# Patient Record
Sex: Female | Born: 1960
Health system: Southern US, Community
[De-identification: ages and names within clinical notes are randomized; demographics above are authoritative.]

## PROBLEM LIST (undated history)

## (undated) DIAGNOSIS — F329 Major depressive disorder, single episode, unspecified: Secondary | ICD-10-CM

## (undated) DIAGNOSIS — E785 Hyperlipidemia, unspecified: Secondary | ICD-10-CM

## (undated) DIAGNOSIS — K760 Fatty (change of) liver, not elsewhere classified: Secondary | ICD-10-CM

## (undated) DIAGNOSIS — F419 Anxiety disorder, unspecified: Secondary | ICD-10-CM

## (undated) DIAGNOSIS — M47812 Spondylosis without myelopathy or radiculopathy, cervical region: Secondary | ICD-10-CM

## (undated) DIAGNOSIS — I700 Atherosclerosis of aorta: Secondary | ICD-10-CM

## (undated) DIAGNOSIS — M5030 Other cervical disc degeneration, unspecified cervical region: Secondary | ICD-10-CM

## (undated) DIAGNOSIS — F32A Depression, unspecified: Secondary | ICD-10-CM

## (undated) DIAGNOSIS — I10 Essential (primary) hypertension: Secondary | ICD-10-CM

## (undated) DIAGNOSIS — J439 Emphysema, unspecified: Secondary | ICD-10-CM

## (undated) DIAGNOSIS — R7303 Prediabetes: Principal | ICD-10-CM

## (undated) HISTORY — DX: Emphysema, unspecified: J43.9

## (undated) HISTORY — DX: Prediabetes: R73.03

## (undated) HISTORY — DX: Atherosclerosis of aorta: I70.0

## (undated) HISTORY — DX: Spondylosis without myelopathy or radiculopathy, cervical region: M47.812

## (undated) HISTORY — DX: Other cervical disc degeneration, unspecified cervical region: M50.30

## (undated) HISTORY — DX: Fatty (change of) liver, not elsewhere classified: K76.0

---

## 2003-12-11 ENCOUNTER — Other Ambulatory Visit: Payer: Self-pay

## 2004-09-16 ENCOUNTER — Emergency Department: Payer: Self-pay | Admitting: Emergency Medicine

## 2005-05-02 ENCOUNTER — Ambulatory Visit: Payer: Self-pay

## 2005-06-18 ENCOUNTER — Emergency Department: Payer: Self-pay | Admitting: Emergency Medicine

## 2005-06-18 ENCOUNTER — Other Ambulatory Visit: Payer: Self-pay

## 2006-02-13 ENCOUNTER — Emergency Department: Payer: Self-pay | Admitting: Unknown Physician Specialty

## 2008-05-06 ENCOUNTER — Ambulatory Visit: Payer: Self-pay | Admitting: Family Medicine

## 2008-05-31 ENCOUNTER — Ambulatory Visit: Payer: Self-pay | Admitting: Internal Medicine

## 2008-06-05 ENCOUNTER — Emergency Department: Payer: Self-pay | Admitting: Emergency Medicine

## 2008-06-09 ENCOUNTER — Ambulatory Visit: Payer: Self-pay | Admitting: Internal Medicine

## 2008-06-30 ENCOUNTER — Ambulatory Visit: Payer: Self-pay | Admitting: Internal Medicine

## 2008-07-12 ENCOUNTER — Ambulatory Visit: Payer: Self-pay

## 2008-07-28 ENCOUNTER — Ambulatory Visit: Payer: Self-pay

## 2008-07-31 ENCOUNTER — Ambulatory Visit: Payer: Self-pay | Admitting: Internal Medicine

## 2008-08-05 ENCOUNTER — Ambulatory Visit: Payer: Self-pay | Admitting: Internal Medicine

## 2008-08-12 ENCOUNTER — Ambulatory Visit: Payer: Self-pay | Admitting: Internal Medicine

## 2008-08-30 ENCOUNTER — Ambulatory Visit: Payer: Self-pay | Admitting: Internal Medicine

## 2008-12-09 ENCOUNTER — Ambulatory Visit: Payer: Self-pay | Admitting: Internal Medicine

## 2008-12-29 ENCOUNTER — Ambulatory Visit: Payer: Self-pay | Admitting: Internal Medicine

## 2009-05-12 ENCOUNTER — Ambulatory Visit: Payer: Self-pay | Admitting: Family Medicine

## 2010-12-09 ENCOUNTER — Emergency Department: Payer: Self-pay | Admitting: Emergency Medicine

## 2011-06-07 ENCOUNTER — Emergency Department: Payer: Self-pay | Admitting: Emergency Medicine

## 2011-07-28 ENCOUNTER — Emergency Department: Payer: Self-pay | Admitting: Unknown Physician Specialty

## 2011-11-25 ENCOUNTER — Emergency Department: Payer: Self-pay | Admitting: Emergency Medicine

## 2011-11-25 LAB — URINALYSIS, COMPLETE
Bacteria: NONE SEEN
Bilirubin,UR: NEGATIVE
Ketone: NEGATIVE
Leukocyte Esterase: NEGATIVE
Protein: NEGATIVE
RBC,UR: 1 /HPF (ref 0–5)
Specific Gravity: 1.014 (ref 1.003–1.030)
WBC UR: <1 /HPF (ref 0–5)

## 2011-11-25 LAB — CBC
HCT: 41.5 % (ref 35.0–47.0)
HGB: 14.1 g/dL (ref 12.0–16.0)
MCH: 30.6 pg (ref 26.0–34.0)
MCHC: 34.1 g/dL (ref 32.0–36.0)
MCV: 90 fL (ref 80–100)
RBC: 4.62 10*6/uL (ref 3.80–5.20)
WBC: 11.9 10*3/uL — ABNORMAL HIGH (ref 3.6–11.0)

## 2011-11-25 LAB — COMPREHENSIVE METABOLIC PANEL
Albumin: 3.7 g/dL (ref 3.4–5.0)
Anion Gap: 7 (ref 7–16)
Bilirubin,Total: 0.5 mg/dL (ref 0.2–1.0)
Calcium, Total: 8.7 mg/dL (ref 8.5–10.1)
Chloride: 103 mmol/L (ref 98–107)
Co2: 28 mmol/L (ref 21–32)
Creatinine: 0.77 mg/dL (ref 0.60–1.30)
EGFR (African American): >60
EGFR (Non-African Amer.): >60
Glucose: 123 mg/dL — ABNORMAL HIGH (ref 65–99)
Osmolality: 276 (ref 275–301)
Sodium: 138 mmol/L (ref 136–145)

## 2012-05-08 ENCOUNTER — Ambulatory Visit: Payer: Self-pay | Admitting: Family Medicine

## 2013-06-18 ENCOUNTER — Ambulatory Visit: Payer: Self-pay | Admitting: Family Medicine

## 2013-06-28 ENCOUNTER — Ambulatory Visit: Payer: Self-pay | Admitting: Family Medicine

## 2014-08-15 ENCOUNTER — Ambulatory Visit: Payer: Self-pay | Admitting: Family Medicine

## 2015-07-13 ENCOUNTER — Encounter: Payer: Self-pay | Admitting: Emergency Medicine

## 2015-07-13 ENCOUNTER — Emergency Department
Admission: EM | Admit: 2015-07-13 | Discharge: 2015-07-13 | Disposition: A | Discharge status: Home / Self Care | Payer: No Typology Code available for payment source | Attending: Emergency Medicine | Admitting: Emergency Medicine

## 2015-07-13 DIAGNOSIS — K859 Acute pancreatitis without necrosis or infection, unspecified: Secondary | ICD-10-CM | POA: Diagnosis not present

## 2015-07-13 DIAGNOSIS — R1030 Lower abdominal pain, unspecified: Secondary | ICD-10-CM | POA: Diagnosis present

## 2015-07-13 DIAGNOSIS — Z720 Tobacco use: Secondary | ICD-10-CM | POA: Insufficient documentation

## 2015-07-13 DIAGNOSIS — I10 Essential (primary) hypertension: Secondary | ICD-10-CM | POA: Diagnosis not present

## 2015-07-13 HISTORY — DX: Essential (primary) hypertension: I10

## 2015-07-13 HISTORY — DX: Anxiety disorder, unspecified: F41.9

## 2015-07-13 HISTORY — DX: Depression, unspecified: F32.A

## 2015-07-13 HISTORY — DX: Major depressive disorder, single episode, unspecified: F32.9

## 2015-07-13 HISTORY — DX: Hyperlipidemia, unspecified: E78.5

## 2015-07-13 LAB — COMPREHENSIVE METABOLIC PANEL
ALBUMIN: 4.2 g/dL (ref 3.5–5.0)
ALK PHOS: 49 U/L (ref 38–126)
ALT: 20 U/L (ref 14–54)
ANION GAP: 9 (ref 5–15)
AST: 41 U/L (ref 15–41)
BUN: 12 mg/dL (ref 6–20)
CALCIUM: 8.9 mg/dL (ref 8.9–10.3)
CO2: 26 mmol/L (ref 22–32)
Chloride: 103 mmol/L (ref 101–111)
Creatinine, Ser: 0.73 mg/dL (ref 0.44–1.00)
GFR calc non Af Amer: >60 mL/min (ref >60)
GLUCOSE: 102 mg/dL — AB (ref 65–99)
POTASSIUM: 4.1 mmol/L (ref 3.5–5.1)
SODIUM: 138 mmol/L (ref 135–145)
Total Bilirubin: 0.7 mg/dL (ref 0.3–1.2)
Total Protein: 7.1 g/dL (ref 6.5–8.1)

## 2015-07-13 LAB — CBC
HEMATOCRIT: 42.7 % (ref 35.0–47.0)
HEMOGLOBIN: 14.2 g/dL (ref 12.0–16.0)
MCH: 30.5 pg (ref 26.0–34.0)
MCHC: 33.2 g/dL (ref 32.0–36.0)
MCV: 91.7 fL (ref 80.0–100.0)
Platelets: 362 10*3/uL (ref 150–440)
RBC: 4.66 MIL/uL (ref 3.80–5.20)
RDW: 14 % (ref 11.5–14.5)
WBC: 6.9 10*3/uL (ref 3.6–11.0)

## 2015-07-13 LAB — URINALYSIS COMPLETE WITH MICROSCOPIC (ARMC ONLY)
BACTERIA UA: NONE SEEN
Bilirubin Urine: NEGATIVE
Glucose, UA: NEGATIVE mg/dL
Hgb urine dipstick: NEGATIVE
KETONES UR: NEGATIVE mg/dL
Leukocytes, UA: NEGATIVE
NITRITE: NEGATIVE
PH: 8 (ref 5.0–8.0)
PROTEIN: NEGATIVE mg/dL
SPECIFIC GRAVITY, URINE: 1.008 (ref 1.005–1.030)
WBC UA: NONE SEEN WBC/hpf (ref 0–5)

## 2015-07-13 LAB — LIPASE, BLOOD: LIPASE: 92 U/L — AB (ref 22–51)

## 2015-07-13 MED ORDER — OXYCODONE-ACETAMINOPHEN 5-325 MG PO TABS: 1.0000 | ORAL_TABLET | Freq: Four times a day (QID) | ORAL | Status: DC | PRN

## 2015-07-13 NOTE — ED Provider Notes (Signed)
[facility] Emergency Department Provider Note  Time seen: 1:04 PM  I have reviewed the triage vital signs and the nursing notes.   HISTORY  Chief Complaint Abdominal Pain    HPI Savannah Washington is a 54 y.o. female with a past medical history of depression, anxiety, hypertension, hyperlipidemia who presents the emergency department up abdominal pain. According to the patient for the past one week she has had a dull aching upper abdominal pain.  Denies any worsening of pain with food. The patient does admit 6-7 beers nightly for the past 3 or 4 months. Has not seen her primary care physician about this, but states she plans to. Denies any nausea or vomiting. Denies diarrhea, dysuria, fever. Describes her pain as mild.    Past Medical History  Diagnosis Date  . Depression   . Anxiety   . Hypertension   . Hyperlipidemia     There are no active problems to display for this patient.   History reviewed. No pertinent past surgical history.  No current outpatient prescriptions on file.  Allergies Review of patient's allergies indicates no known allergies.  History reviewed. No pertinent family history.  Social History Social History  Substance Use Topics  . Smoking status: Current Every Day Smoker  . Smokeless tobacco: None  . Alcohol Use: Yes    Review of Systems Constitutional: Negative for fever. Cardiovascular: Negative for chest pain. Respiratory: Negative for shortness of breath. Gastrointestinal: Positive for upper abdominal pain. Negative for nausea, vomiting, diarrhea. Genitourinary: Negative for dysuria. Musculoskeletal: Negative for back pain. Neurological: Negative for headache 10-point ROS otherwise negative.  ____________________________________________   PHYSICAL EXAM:  VITAL SIGNS: ED Triage Vitals  Enc Vitals Group     BP 07/13/15 0944 155/109 mmHg     Pulse Rate 07/13/15 0944 88     Resp 07/13/15 0944 20     Temp  07/13/15 0944 98.5 F (36.9 C)     Temp Source 07/13/15 0944 Oral     SpO2 07/13/15 0944 95 %     Weight 07/13/15 0944 140 lb (63.504 kg)     Height 07/13/15 0944 5\' 2"  (1.575 m)     Head Cir --      Peak Flow --      Pain Score 07/13/15 0945 6     Pain Loc --      Pain Edu? --      Excl. in [place]? --     Constitutional: Alert and oriented. Well appearing and in no distress. Eyes: Normal exam ENT   Head: Normocephalic and atraumatic.   Mouth/Throat: Mucous membranes are moist. Cardiovascular: Normal rate, regular rhythm. No murmur Respiratory: Normal respiratory effort without tachypnea nor retractions. Breath sounds are clear and equal bilaterally. No wheezes/rales/rhonchi. Gastrointestinal: Soft, moderate upper abdominal tenderness palpation in the epigastrium. No distention. No rebound or guarding. Musculoskeletal: Nontender with normal range of motion in all extremities. Neurologic:  Normal speech and language. No gross focal neurologic deficits Psychiatric: Mood and affect are normal. Speech and behavior are normal.  ____________________________________________    INITIAL IMPRESSION / ASSESSMENT AND PLAN / ED COURSE  Pertinent labs & imaging results that were available during my care of the patient were reviewed by me and considered in my medical decision making (see chart for details).  Patient's labs are consistent with a mild pancreatitis. Likely due to alcoholism. I discussed this with the patient, she strongly prefers to go home, believes that she can stop drinking, does  not wish for inpatient detox. I discussed in depth with the patient the need to limit her diet to clear fluids for the next 48 hours, no alcohol use. Denies any history of complicated withdrawal or seizures. Patient is follow up with her primary care doctor on Monday. Patient agreeable we'll discharge with Percocet, I discussed in length the dangers of taking Percocet with alcohol, patient again assures  me that she is able to stop drinking. I discussed very strict return precautions with the patient, if she begins vomiting, and having worsening abdominal pain, she is to return to the emergency department for reevaluation and possible admission.  ____________________________________________   FINAL CLINICAL IMPRESSION(S) / ED DIAGNOSES  Pancreatitis   Harvest Dark, MD 07/13/15 1310

## 2015-07-13 NOTE — Discharge Instructions (Signed)
As we have discussed your labs today are consistent with pancreatitis. This is likely due to to alcohol intake. Please discontinue use of alcohol. Please stick with a clear liquid diet as we discussed for the first 48 hours. This may consist of things like water, Jell-O, etc. After this time you may slowly increase your diet to include starches, do not eat any greasy/oily/fried foods. you may take your pain medication as needed, as prescribed, as we discussed do not drink alcohol when taking this pain medication as the results could be deadly. Please follow up your primary care physician on Monday for reevaluation.    Acute Pancreatitis Acute pancreatitis is a disease in which the pancreas becomes suddenly irritated (inflamed). The pancreas is a large gland behind your stomach. The pancreas makes enzymes that help digest food. The pancreas also makes 2 hormones that help control your blood sugar. Acute pancreatitis happens when the enzymes attack and damage the pancreas. Most attacks last a couple of days and can cause serious problems. HOME CARE  Follow your doctor's diet instructions. You may need to avoid alcohol and limit fat in your diet.  Eat small meals often.  Drink enough fluids to keep your pee (urine) clear or pale yellow.  Only take medicines as told by your doctor.  Avoid drinking alcohol if it caused your disease.  Do not smoke.  Get plenty of rest.  Check your blood sugar at home as told by your doctor.  Keep all doctor visits as told. GET HELP IF:  You do not get better as quickly as expected.  You have new or worsening symptoms.  You have lasting pain, weakness, or feel sick to your stomach (nauseous).  You get better and then have another pain attack. GET HELP RIGHT AWAY IF:   You are unable to eat or keep fluids down.  Your pain becomes severe.  You have a fever or lasting symptoms for more than 2 to 3 days.  You have a fever and your symptoms suddenly get  worse.  Your skin or the white part of your eyes turn yellow (jaundice).  You throw up (vomit).  You feel dizzy, or you pass out (faint).  Your blood sugar is high (over 300 mg/dL). MAKE SURE YOU:   Understand these instructions.  Will watch your condition.  Will get help right away if you are not doing well or get worse.   This information is not intended to replace advice given to you by your health care provider. Make sure you discuss any questions you have with your health care provider.   Document Released: 03/04/2008 Document Revised: 10/07/2014 Document Reviewed: 12/26/2011 Elsevier Interactive Patient Education [employer].

## 2015-07-13 NOTE — ED Notes (Signed)
Pt to ed with c/o generalized abd pain.  Pt denies n/v/d.  Pt states she has been under a great amount of stress recently, states " I think I may have an ulcer"

## 2015-09-19 ENCOUNTER — Other Ambulatory Visit: Payer: Self-pay | Admitting: Family Medicine

## 2015-09-19 DIAGNOSIS — Z1231 Encounter for screening mammogram for malignant neoplasm of breast: Secondary | ICD-10-CM

## 2015-09-28 ENCOUNTER — Ambulatory Visit: Payer: No Typology Code available for payment source | Attending: Family Medicine

## 2015-10-30 ENCOUNTER — Emergency Department
Admission: EM | Admit: 2015-10-30 | Discharge: 2015-10-30 | Disposition: A | Discharge status: Home / Self Care | Payer: No Typology Code available for payment source | Attending: Emergency Medicine | Admitting: Emergency Medicine

## 2015-10-30 ENCOUNTER — Encounter: Payer: Self-pay | Admitting: Emergency Medicine

## 2015-10-30 DIAGNOSIS — F329 Major depressive disorder, single episode, unspecified: Secondary | ICD-10-CM | POA: Insufficient documentation

## 2015-10-30 DIAGNOSIS — I10 Essential (primary) hypertension: Secondary | ICD-10-CM | POA: Insufficient documentation

## 2015-10-30 DIAGNOSIS — K2970 Gastritis, unspecified, without bleeding: Secondary | ICD-10-CM | POA: Insufficient documentation

## 2015-10-30 DIAGNOSIS — F17210 Nicotine dependence, cigarettes, uncomplicated: Secondary | ICD-10-CM | POA: Insufficient documentation

## 2015-10-30 LAB — COMPREHENSIVE METABOLIC PANEL
ALBUMIN: 4.7 g/dL (ref 3.5–5.0)
ALT: 18 U/L (ref 14–54)
ANION GAP: 10 (ref 5–15)
AST: 30 U/L (ref 15–41)
Alkaline Phosphatase: 75 U/L (ref 38–126)
BILIRUBIN TOTAL: 0.6 mg/dL (ref 0.3–1.2)
BUN: 17 mg/dL (ref 6–20)
CO2: 28 mmol/L (ref 22–32)
Calcium: 9.5 mg/dL (ref 8.9–10.3)
Chloride: 101 mmol/L (ref 101–111)
Creatinine, Ser: 0.87 mg/dL (ref 0.44–1.00)
GFR calc Af Amer: >60 mL/min (ref >60)
GFR calc non Af Amer: >60 mL/min (ref >60)
GLUCOSE: 74 mg/dL (ref 65–99)
POTASSIUM: 4 mmol/L (ref 3.5–5.1)
SODIUM: 139 mmol/L (ref 135–145)
TOTAL PROTEIN: 7.8 g/dL (ref 6.5–8.1)

## 2015-10-30 LAB — CBC
HEMATOCRIT: 44.9 % (ref 35.0–47.0)
HEMOGLOBIN: 14.9 g/dL (ref 12.0–16.0)
MCH: 30.5 pg (ref 26.0–34.0)
MCHC: 33.2 g/dL (ref 32.0–36.0)
MCV: 91.8 fL (ref 80.0–100.0)
Platelets: 433 10*3/uL (ref 150–440)
RBC: 4.89 MIL/uL (ref 3.80–5.20)
RDW: 13.8 % (ref 11.5–14.5)
WBC: 12 10*3/uL — ABNORMAL HIGH (ref 3.6–11.0)

## 2015-10-30 LAB — URINALYSIS COMPLETE WITH MICROSCOPIC (ARMC ONLY)
BACTERIA UA: NONE SEEN
Bilirubin Urine: NEGATIVE
GLUCOSE, UA: NEGATIVE mg/dL
Hgb urine dipstick: NEGATIVE
Ketones, ur: NEGATIVE mg/dL
Leukocytes, UA: NEGATIVE
Nitrite: NEGATIVE
PROTEIN: NEGATIVE mg/dL
SPECIFIC GRAVITY, URINE: 1.008 (ref 1.005–1.030)
pH: 6 (ref 5.0–8.0)

## 2015-10-30 LAB — LIPASE, BLOOD: Lipase: 52 U/L — ABNORMAL HIGH (ref 11–51)

## 2015-10-30 MED ORDER — GI COCKTAIL ~~LOC~~
30.0000 mL | Freq: Once | ORAL | Status: AC
  Administered 2015-10-30: 30 mL via ORAL
  Filled 2015-10-30: qty 30

## 2015-10-30 MED ORDER — ONDANSETRON 4 MG PO TBDP
8.0000 mg | ORAL_TABLET | Freq: Once | ORAL | Status: AC
  Administered 2015-10-30: 8 mg via ORAL
  Filled 2015-10-30: qty 2

## 2015-10-30 MED ORDER — ONDANSETRON HCL 4 MG PO TABS: 4.0000 mg | ORAL_TABLET | Freq: Every day | ORAL | Status: DC | PRN

## 2015-10-30 NOTE — Discharge Instructions (Signed)

## 2015-10-30 NOTE — ED Provider Notes (Signed)
[facility] Emergency Department Provider Note  ____________________________________________   I have reviewed the triage vital signs and the nursing notes.   HISTORY  Chief Complaint Abdominal Pain    HPI Savannah Washington is a 55 y.o. female who presents with complaints of mild epigastric discomfort over the last 2 weeks. She is concerned that her pancreas may be irritated. She does report a history of heavy drinking in the past but reports that she no longer drinks heavily. She complains of nausea but no vomiting or diarrhea. No fevers or chills. Patient reports she has a family member with vomiting     Past Medical History  Diagnosis Date  . Depression   . Anxiety   . Hypertension   . Hyperlipidemia     There are no active problems to display for this patient.   History reviewed. No pertinent past surgical history.  Current Outpatient Rx  Name  Route  Sig  Dispense  Refill  . oxyCODONE-acetaminophen (ROXICET) 5-325 MG tablet   Oral   Take 1 tablet by mouth every 6 (six) hours as needed.   20 tablet   0     Allergies Review of patient's allergies indicates no known allergies.  No family history on file.  Social History Social History  Substance Use Topics  . Smoking status: Current Every Day Smoker -- 0.50 packs/day    Types: Cigarettes  . Smokeless tobacco: None  . Alcohol Use: Yes    Review of Systems  Constitutional: Negative for fever. Eyes: Negative for visual changes. ENT: Negative for sore throat Cardiovascular: Negative for chest pain. Respiratory: Negative for shortness of breath. Gastrointestinal: As above Genitourinary: Negative for dysuria. Musculoskeletal: Negative for back pain. Skin: Negative for rash. Neurological: Negative for headaches or focal weakness Psychiatric: Mild depression from husband's recent death, no SI or HI, does sound a  psychiatrist    ____________________________________________   PHYSICAL EXAM:  VITAL SIGNS: ED Triage Vitals  Enc Vitals Group     BP 10/30/15 1510 121/92 mmHg     Pulse Rate 10/30/15 1510 85     Resp 10/30/15 1510 18     Temp 10/30/15 1510 98.1 F (36.7 C)     Temp Source 10/30/15 1510 Oral     SpO2 10/30/15 1510 92 %     Weight 10/30/15 1503 145 lb (65.772 kg)     Height 10/30/15 1503 5\' 2"  (1.575 m)     Head Cir --      Peak Flow --      Pain Score 10/30/15 1505 6     Pain Loc --      Pain Edu? --      Excl. in [place]? --      Constitutional: Alert and oriented. Well appearing and in no distress. Eyes: Conjunctivae are normal.  ENT   Head: Normocephalic and atraumatic.   Mouth/Throat: Mucous membranes are moist. Cardiovascular: Normal rate, regular rhythm. Normal and symmetric distal pulses are present in all extremities. No murmurs, rubs, or gallops. Respiratory: Normal respiratory effort without tachypnea nor retractions. Breath sounds are clear and equal bilaterally.  Gastrointestinal: Soft and non-tender in all quadrants. No distention. There is no CVA tenderness. Genitourinary: deferred Musculoskeletal: Nontender with normal range of motion in all extremities. No lower extremity tenderness nor edema. Neurologic:  Normal speech and language. No gross focal neurologic deficits are appreciated. Skin:  Skin is warm, dry and intact. No rash noted. Psychiatric: Mood and affect are normal. Patient  exhibits appropriate insight and judgment.  ____________________________________________    LABS (pertinent positives/negatives)  Labs Reviewed  LIPASE, BLOOD - Abnormal; Notable for the following:    Lipase 52 (*)    All other components within normal limits  CBC - Abnormal; Notable for the following:    WBC 12.0 (*)    All other components within normal limits  URINALYSIS COMPLETEWITH MICROSCOPIC ([facility] ONLY) - Abnormal; Notable for the following:    Color,  Urine STRAW (*)    APPearance CLEAR (*)    Squamous Epithelial / LPF 0-5 (*)    All other components within normal limits  COMPREHENSIVE METABOLIC PANEL    ____________________________________________   EKG  ED ECG REPORT I, Lavonia Drafts, the attending physician, personally viewed and interpreted this ECG.  Date: 10/30/2015 EKG Time: 3:16 PM Rate: 88 Rhythm: normal sinus rhythm QRS Axis: normal Intervals: normal ST/T Wave abnormalities: normal Conduction Disturbances: none Narrative Interpretation: unremarkable   ____________________________________________    RADIOLOGY I have personally reviewed any xrays that were ordered on this patient: None  ____________________________________________   PROCEDURES  Procedure(s) performed: none  Critical Care performed: none  ____________________________________________   INITIAL IMPRESSION / ASSESSMENT AND PLAN / ED COURSE  Pertinent labs & imaging results that were available during my care of the patient were reviewed by me and considered in my medical decision making (see chart for details).  Patient presents with mild epigastric pain for the last 2 weeks. She has no tenderness to palpation. Not consistent with cholecystitis. Her lipase is essentially normal. I suspect mild gastritis.  Patient did get relief from a GI cocktail. I'll send her home on a PPI and have her follow-up with her outpatient physician. Return precautions discussed ____________________________________________   FINAL CLINICAL IMPRESSION(S) / ED DIAGNOSES  Final diagnoses:  Gastritis     Lavonia Drafts, MD 10/30/15 1714

## 2015-10-30 NOTE — ED Notes (Signed)
Epigastric pain for 2 weeks now, pt states it feels like her pancreatitis has started again. Nauseated, no vomiting or diarrhea.

## 2015-11-29 ENCOUNTER — Ambulatory Visit: Payer: No Typology Code available for payment source | Attending: Family Medicine

## 2015-12-07 ENCOUNTER — Ambulatory Visit
Admission: RE | Admit: 2015-12-07 | Discharge: 2015-12-07 | Disposition: A | Discharge status: Home / Self Care | Payer: No Typology Code available for payment source | Source: Ambulatory Visit | Attending: Family Medicine | Admitting: Family Medicine

## 2015-12-07 DIAGNOSIS — Z1231 Encounter for screening mammogram for malignant neoplasm of breast: Secondary | ICD-10-CM | POA: Insufficient documentation

## 2017-01-06 ENCOUNTER — Other Ambulatory Visit: Payer: Self-pay | Admitting: Obstetrics and Gynecology

## 2017-01-06 DIAGNOSIS — Z1231 Encounter for screening mammogram for malignant neoplasm of breast: Secondary | ICD-10-CM

## 2017-01-27 ENCOUNTER — Emergency Department: Payer: BLUE CROSS/BLUE SHIELD

## 2017-01-27 ENCOUNTER — Emergency Department
Admission: EM | Admit: 2017-01-27 | Discharge: 2017-01-27 | Disposition: A | Discharge status: Home / Self Care | Payer: BLUE CROSS/BLUE SHIELD | Attending: Emergency Medicine | Admitting: Emergency Medicine

## 2017-01-27 ENCOUNTER — Encounter: Payer: Self-pay | Admitting: Emergency Medicine

## 2017-01-27 DIAGNOSIS — Y929 Unspecified place or not applicable: Secondary | ICD-10-CM | POA: Insufficient documentation

## 2017-01-27 DIAGNOSIS — S8992XA Unspecified injury of left lower leg, initial encounter: Secondary | ICD-10-CM | POA: Diagnosis present

## 2017-01-27 DIAGNOSIS — F17210 Nicotine dependence, cigarettes, uncomplicated: Secondary | ICD-10-CM | POA: Diagnosis not present

## 2017-01-27 DIAGNOSIS — S8012XA Contusion of left lower leg, initial encounter: Secondary | ICD-10-CM

## 2017-01-27 DIAGNOSIS — Y939 Activity, unspecified: Secondary | ICD-10-CM | POA: Insufficient documentation

## 2017-01-27 DIAGNOSIS — Z79899 Other long term (current) drug therapy: Secondary | ICD-10-CM | POA: Insufficient documentation

## 2017-01-27 DIAGNOSIS — I10 Essential (primary) hypertension: Secondary | ICD-10-CM | POA: Insufficient documentation

## 2017-01-27 DIAGNOSIS — W228XXA Striking against or struck by other objects, initial encounter: Secondary | ICD-10-CM | POA: Diagnosis not present

## 2017-01-27 DIAGNOSIS — Y999 Unspecified external cause status: Secondary | ICD-10-CM | POA: Insufficient documentation

## 2017-01-27 NOTE — ED Notes (Addendum)
See triage note  States she hit her lower leg about 1 month ago while running to get in the car  conts to have swelling to lateral aspect of leg  Ambulates well to treatment room

## 2017-01-27 NOTE — ED Triage Notes (Signed)
Pt reports hitting left side of leg on car door x1 month ago, reports continued swelling.

## 2017-01-27 NOTE — ED Provider Notes (Signed)
[facility] Emergency Department Provider Note   ____________________________________________   None    (approximate)  I have reviewed the triage vital signs and the nursing notes.   HISTORY  Chief Complaint Leg Pain    HPI Savannah Washington is a 56 y.o. female patient complaining of left pain and swelling 1 month. Patient state she was contused by car door during a rainstorm.Patient state ecchymosis has resolved but the swelling is persistent and seems to have increased in the last week. Patient denies pain except when pressure is placed against area. Patient denies chest pain or shortness of breath. Patient does not take blood thinners. Past Medical History:  Diagnosis Date  . Anxiety   . Depression   . Hyperlipidemia   . Hypertension     There are no active problems to display for this patient.   No past surgical history on file.  Prior to Admission medications   Medication Sig Start Date End Date Taking? Authorizing Provider  albuterol (PROVENTIL HFA;VENTOLIN HFA) 108 (90 Base) MCG/ACT inhaler Inhale 2 puffs into the lungs every 6 (six) hours as needed for wheezing or shortness of breath.   Yes Historical Provider, MD  ALPRAZolam Duanne Moron) 0.5 MG tablet Take 0.5 mg by mouth at bedtime as needed for anxiety.   Yes Historical Provider, MD  buPROPion (WELLBUTRIN) 100 MG tablet Take 100 mg by mouth 2 (two) times daily.   Yes Historical Provider, MD  lisinopril (PRINIVIL,ZESTRIL) 5 MG tablet Take 5 mg by mouth daily.   Yes Historical Provider, MD  meloxicam (MOBIC) 7.5 MG tablet Take 7.5 mg by mouth daily.   Yes Historical Provider, MD  metFORMIN (GLUCOPHAGE) 1000 MG tablet Take 1,000 mg by mouth 2 (two) times daily with a meal.   Yes Historical Provider, MD  ondansetron (ZOFRAN) 4 MG tablet Take 1 tablet (4 mg total) by mouth daily as needed for nausea or vomiting. 10/30/15   Lavonia Drafts, MD  oxyCODONE-acetaminophen (ROXICET) 5-325 MG tablet Take 1  tablet by mouth every 6 (six) hours as needed. 07/13/15   Harvest Dark, MD    Allergies Patient has no known allergies.  Family History  Problem Relation Age of Onset  . Breast cancer Sister 38    Social History Social History  Substance Use Topics  . Smoking status: Current Every Day Smoker    Packs/day: 0.50    Types: Cigarettes  . Smokeless tobacco: Not on file  . Alcohol use Yes    Review of Systems  Constitutional: No fever/chills Eyes: No visual changes. ENT: No sore throat. Cardiovascular: Denies chest pain. Respiratory: Denies shortness of breath. Gastrointestinal: No abdominal pain.  No nausea, no vomiting.  No diarrhea.  No constipation. Genitourinary: Negative for dysuria. Musculoskeletal: Negative for back pain. Skin: Negative for rash. Neurological: Negative for headaches, focal weakness or numbness. Psychiatric:Anxiety and depression. Endocrine:Hyperlipidemia, diabetes, hypertension.  ____________________________________________   PHYSICAL EXAM:  VITAL SIGNS: ED Triage Vitals  Enc Vitals Group     BP 01/27/17 1223 (!) 129/91     Pulse Rate 01/27/17 1223 82     Resp 01/27/17 1223 17     Temp 01/27/17 1223 97.5 F (36.4 C)     Temp Source 01/27/17 1223 Oral     SpO2 01/27/17 1223 96 %     Weight --      Height --      Head Circumference --      Peak Flow --  Pain Score 01/27/17 1219 0     Pain Loc --      Pain Edu? --      Excl. in [place]? --     Constitutional: Alert and oriented. Well appearing and in no acute distress. Eyes: Conjunctivae are normal. PERRL. EOMI. Head: Atraumatic. Nose: No congestion/rhinnorhea. Mouth/Throat: Mucous membranes are moist.  Oropharynx non-erythematous. Neck: No stridor.  No cervical spine tenderness to palpation. Hematological/Lymphatic/Immunilogical: No cervical lymphadenopathy. Cardiovascular: Normal rate, regular rhythm. Grossly normal heart sounds.  Good peripheral circulation. Respiratory:  Normal respiratory effort.  No retractions. Lungs CTAB. Gastrointestinal: Soft and nontender. No distention. No abdominal bruits. No CVA tenderness. Musculoskeletal: No lower extremity tenderness nor edema.  No joint effusions. Neurologic:  Normal speech and language. No gross focal neurologic deficits are appreciated. No gait instability. Skin:  Skin is warm, dry and intact. No rash noted. Psychiatric: Mood and affect are normal. Speech and behavior are normal.  ____________________________________________   LABS (all labs ordered are listed, but only abnormal results are displayed)  Labs Reviewed - No data to display ____________________________________________  EKG   ____________________________________________  RADIOLOGY   _findings on ultrasound the left leg. PROCEDURES  Procedure(s) performed: None  Procedures  Critical Care performed: No  ____________________________________________   INITIAL IMPRESSION / ASSESSMENT AND PLAN / ED COURSE  Pertinent labs & imaging results that were available during my care of the patient were reviewed by me and considered in my medical decision making (see chart for details).  Hematoma secondary to contusion. Discussed ultrasound finding with patient. Patient given discharge care instructions. Patient advised follow-up With Dr. as needed.    ____________________________________________   FINAL CLINICAL IMPRESSION(S) / ED DIAGNOSES  Final diagnoses:  Leg hematoma, left, initial encounter      NEW MEDICATIONS STARTED DURING THIS VISIT:  New Prescriptions   No medications on file     Note:  This document was prepared using Dragon voice recognition software and may include unintentional dictation errors.    Sable Feil, PA-C 01/27/17 [place], MD 01/27/17 [phone]

## 2017-02-11 ENCOUNTER — Ambulatory Visit: Payer: Self-pay | Admitting: Family Medicine

## 2017-02-25 ENCOUNTER — Ambulatory Visit
Admission: RE | Admit: 2017-02-25 | Discharge: 2017-02-25 | Disposition: A | Discharge status: Home / Self Care | Payer: BLUE CROSS/BLUE SHIELD | Source: Ambulatory Visit | Attending: Obstetrics and Gynecology | Admitting: Obstetrics and Gynecology

## 2017-02-25 DIAGNOSIS — Z1231 Encounter for screening mammogram for malignant neoplasm of breast: Secondary | ICD-10-CM

## 2017-03-14 ENCOUNTER — Ambulatory Visit (INDEPENDENT_AMBULATORY_CARE_PROVIDER_SITE_OTHER): Payer: BLUE CROSS/BLUE SHIELD | Admitting: Family Medicine

## 2017-03-14 ENCOUNTER — Encounter: Payer: Self-pay | Admitting: Family Medicine

## 2017-03-14 DIAGNOSIS — F1290 Cannabis use, unspecified, uncomplicated: Secondary | ICD-10-CM

## 2017-03-14 DIAGNOSIS — Z5181 Encounter for therapeutic drug level monitoring: Secondary | ICD-10-CM

## 2017-03-14 DIAGNOSIS — E782 Mixed hyperlipidemia: Secondary | ICD-10-CM

## 2017-03-14 DIAGNOSIS — E785 Hyperlipidemia, unspecified: Secondary | ICD-10-CM | POA: Insufficient documentation

## 2017-03-14 DIAGNOSIS — I10 Essential (primary) hypertension: Secondary | ICD-10-CM | POA: Insufficient documentation

## 2017-03-14 DIAGNOSIS — F411 Generalized anxiety disorder: Secondary | ICD-10-CM

## 2017-03-14 DIAGNOSIS — F419 Anxiety disorder, unspecified: Secondary | ICD-10-CM | POA: Insufficient documentation

## 2017-03-14 DIAGNOSIS — R7303 Prediabetes: Secondary | ICD-10-CM | POA: Diagnosis not present

## 2017-03-14 HISTORY — DX: Prediabetes: R73.03

## 2017-03-14 LAB — COMPLETE METABOLIC PANEL WITH GFR
ALT: 22 U/L (ref 6–29)
AST: 31 U/L (ref 10–35)
Albumin: 4.6 g/dL (ref 3.6–5.1)
Alkaline Phosphatase: 57 U/L (ref 33–130)
BUN: 13 mg/dL (ref 7–25)
CALCIUM: 10.1 mg/dL (ref 8.6–10.4)
CHLORIDE: 101 mmol/L (ref 98–110)
CO2: 23 mmol/L (ref 20–31)
Creat: 0.99 mg/dL (ref 0.50–1.05)
GFR, EST AFRICAN AMERICAN: 73 mL/min (ref >=60)
GFR, EST NON AFRICAN AMERICAN: 63 mL/min (ref >=60)
Glucose, Bld: 96 mg/dL (ref 65–99)
POTASSIUM: 4.8 mmol/L (ref 3.5–5.3)
Sodium: 138 mmol/L (ref 135–146)
Total Bilirubin: 0.2 mg/dL (ref 0.2–1.2)
Total Protein: 6.8 g/dL (ref 6.1–8.1)

## 2017-03-14 LAB — CBC WITH DIFFERENTIAL/PLATELET
BASOS ABS: 75 {cells}/uL (ref 0–200)
Basophils Relative: 1 %
Eosinophils Absolute: 225 cells/uL (ref 15–500)
Eosinophils Relative: 3 %
HEMATOCRIT: 42.5 % (ref 35.0–45.0)
HEMOGLOBIN: 13.7 g/dL (ref 11.7–15.5)
LYMPHS ABS: 2775 {cells}/uL (ref 850–3900)
Lymphocytes Relative: 37 %
MCH: 28.8 pg (ref 27.0–33.0)
MCHC: 32.2 g/dL (ref 32.0–36.0)
MCV: 89.5 fL (ref 80.0–100.0)
MONO ABS: 750 {cells}/uL (ref 200–950)
MPV: 8.2 fL (ref 7.5–12.5)
Monocytes Relative: 10 %
NEUTROS ABS: 3675 {cells}/uL (ref 1500–7800)
NEUTROS PCT: 49 %
Platelets: 427 10*3/uL — ABNORMAL HIGH (ref 140–400)
RBC: 4.75 MIL/uL (ref 3.80–5.10)
RDW: 14.1 % (ref 11.0–15.0)
WBC: 7.5 10*3/uL (ref 3.8–10.8)

## 2017-03-14 LAB — LIPID PANEL
CHOLESTEROL: 212 mg/dL — AB (ref <200)
HDL: 44 mg/dL — AB (ref >50)
TRIGLYCERIDES: 459 mg/dL — AB (ref <150)
Total CHOL/HDL Ratio: 4.8 Ratio (ref <5.0)

## 2017-03-14 MED ORDER — BUPROPION HCL ER (XL) 300 MG PO TB24: 300.0000 mg | ORAL_TABLET | Freq: Every day | ORAL | 1 refills | Status: DC

## 2017-03-14 NOTE — Patient Instructions (Addendum)
Try to use PLAIN allergy medicine without the decongestant Avoid: phenylephrine, phenylpropanolamine, and pseudoephredine  Try to limit saturated fats in your diet (bologna, hot dogs, barbeque, cheeseburgers, hamburgers, steak, bacon, sausage, cheese, etc.) and get more fresh fruits, vegetables, and whole grains  I recommend strongly that you decrease your benzo Cut down on the benzo (clonazepam) by 1/4 to 1/2 pill every week and then you should be off complelely in a few weeks Increase your wellbutrin (bupropion) to the once a day 300 mg srength  12 Ways to Curb Anxiety  ?Anxiety is normal human sensation. It is what helped our ancestors survive the pitfalls of the wilderness. Anxiety is defined as experiencing worry or nervousness about an imminent event or something with an uncertain outcome. It is a feeling experienced by most people at some point in their lives. Anxiety can be triggered by a very personal issue, such as the illness of a loved one, or an event of global proportions, such as a refugee crisis. Some of the symptoms of anxiety are:  Feeling restless.  Having a feeling of impending danger.  Increased heart rate.  Rapid breathing. Sweating.  Shaking.  Weakness or feeling tired.  Difficulty concentrating on anything except the current worry.  Insomnia.  Stomach or bowel problems. What can we do about anxiety we may be feeling? There are many techniques to help manage stress and relax. Here are 12 ways you can reduce your anxiety almost immediately: 1. Turn off the constant feed of information. Take a social media sabbatical. Studies have shown that social media directly contributes to social anxiety.  2. Monitor your television viewing habits. Are you watching shows that are also contributing to your anxiety, such as 24-hour news stations? Try watching something else, or better yet, nothing at all. Instead, listen to music, read an inspirational book or practice a hobby. 3. Eat  nutritious meals. Also, don't skip meals and keep healthful snacks on hand. Hunger and poor diet contributes to feeling anxious. 4. Sleep. Sleeping on a regular schedule for at least seven to eight hours a night will do wonders for your outlook when you are awake. 5. Exercise. Regular exercise will help rid your body of that anxious energy and help you get more restful sleep. 6. Try deep (diaphragmatic) breathing. Inhale slowly through your nose for five seconds and exhale through your mouth. 7. Practice acceptance and gratitude. When anxiety hits, accept that there are things out of your control that shouldn't be of immediate concern.  8. Seek out humor. When anxiety strikes, watch a funny video, read jokes or call a friend who makes you laugh. Laughter is healing for our bodies and releases endorphins that are calming. 9. Stay positive. Take the effort to replace negative thoughts with positive ones. Try to see a stressful situation in a positive light. Try to come up with solutions rather than dwelling on the problem. 10. Figure out what triggers your anxiety. Keep a journal and make note of anxious moments and the events surrounding them. This will help you identify triggers you can avoid or even eliminate. 11. Talk to someone. Let a trusted friend, family member or even trained professional know that you are feeling overwhelmed and anxious. Verbalize what you are feeling and why.  12. Volunteer. If your anxiety is triggered by a crisis on a large scale, become an advocate and work to resolve the problem that is causing you unease. Anxiety is often unwelcome and can become overwhelming. If not  kept in check, it can become a disorder that could require medical treatment. However, if you take the time to care for yourself and avoid the triggers that make you anxious, you will be able to find moments of relaxation and clarity that make your life much more enjoyable.

## 2017-03-14 NOTE — Assessment & Plan Note (Signed)
Continue ACE-I; avoid decongestants

## 2017-03-14 NOTE — Assessment & Plan Note (Signed)
Check Cr, K+, SGPT 

## 2017-03-14 NOTE — Assessment & Plan Note (Signed)
Try to limit saturated fats; continue statin 

## 2017-03-14 NOTE — Assessment & Plan Note (Addendum)
Taper down off of the benzo over the next few weeks; increase bupropion; see AVS; suggested counseling, but patient not interested

## 2017-03-14 NOTE — Assessment & Plan Note (Signed)
Check A1c today; continue metformin; foot exam by MD; recommended 81 mg EC ASA daily
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
## 2017-03-15 LAB — HEMOGLOBIN A1C
HEMOGLOBIN A1C: 6 % — AB (ref <5.7)
MEAN PLASMA GLUCOSE: 126 mg/dL

## 2017-03-17 DIAGNOSIS — F1290 Cannabis use, unspecified, uncomplicated: Secondary | ICD-10-CM | POA: Insufficient documentation

## 2017-03-17 NOTE — Assessment & Plan Note (Signed)
Since she smokes marijuana, I will not prescribe her any benzodiazepine; I instructed her to taper off of the benzo over the coming few weeks, see AVS

## 2017-04-11 ENCOUNTER — Encounter: Payer: Self-pay | Admitting: Family Medicine

## 2017-04-11 ENCOUNTER — Ambulatory Visit (INDEPENDENT_AMBULATORY_CARE_PROVIDER_SITE_OTHER): Payer: BLUE CROSS/BLUE SHIELD | Admitting: Family Medicine

## 2017-04-11 DIAGNOSIS — F411 Generalized anxiety disorder: Secondary | ICD-10-CM

## 2017-04-11 DIAGNOSIS — R7989 Other specified abnormal findings of blood chemistry: Secondary | ICD-10-CM

## 2017-04-11 DIAGNOSIS — R7303 Prediabetes: Secondary | ICD-10-CM

## 2017-04-11 DIAGNOSIS — I10 Essential (primary) hypertension: Secondary | ICD-10-CM | POA: Diagnosis not present

## 2017-04-11 DIAGNOSIS — E782 Mixed hyperlipidemia: Secondary | ICD-10-CM | POA: Diagnosis not present

## 2017-04-11 DIAGNOSIS — Z5181 Encounter for therapeutic drug level monitoring: Secondary | ICD-10-CM

## 2017-04-11 NOTE — Assessment & Plan Note (Signed)
Check sgpt on statin 

## 2017-04-11 NOTE — Progress Notes (Signed)
 BP 112/64   Pulse 93   Temp (!) 97.4 F (36.3 C) (Oral)   Resp 16   Wt 165 lb 8 oz (75.1 kg)   SpO2 96%   BMI 29.88 kg/m    Subjective:    Patient ID: Savannah Washington, female    DOB: 12/08/1960, 56 y.o.   MRN: [id number]  HPI: Savannah Washington is a 56 y.o. female  Chief Complaint  Patient presents with  . Follow-up    HPI She is here for f/u  I asked how she is doing with her sleep and her mood; working night shift too; working 80 hours a week since her husband passed; up and down with clients and regular job during the day Mood helped by buproprion; patient wishes to continue  Reviewed labs; no anemia, mild elev of platelets; gained 20 pounds over last several months  High TG; used to be 700 and now 459; runs in the family; tries to limit fried foods and bread; got back on the fish oil; statin was increased  Prediabetes; last A1c was 6; occasional dry mouth; occasional blurred vision; knows to avoid starches 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 Anxiety   . Depression   . Hyperlipidemia   . Hypertension   . Prediabetes 03/14/2017   History reviewed. No pertinent surgical history. Family History  Problem Relation Age of Onset  . Dementia Mother   . Breast cancer Sister 53  . Aneurysm Father   . Heart attack Brother   . Heart defect Brother   . Brain cancer Daughter   . Heart attack Paternal Grandfather   . Drug abuse Sister    Social History   Social History  . Marital status: Widowed    Spouse name: N/A  . Number of children: N/A  . Years of education: N/A   Occupational History  . Not on file.   Social History Main  Topics  . Smoking status: Former Smoker    Packs/day: 0.50    Types: Cigarettes    Quit date: 11/12/2016  . Smokeless tobacco: Never Used  . Alcohol use 3.0 oz/week    1 Glasses of wine, 4 Cans of beer per week  . Drug use: No  . Sexual activity: No   Other Topics Concern  . Not on file   Social History Narrative  . No narrative on file    Interim medical history since last visit reviewed. Allergies and medications reviewed  Review of Systems Per HPI unless specifically indicated above     Objective:    BP 112/64   Pulse 93   Temp (!) 97.4 F (36.3 C) (Oral)   Resp 16   Wt 165 lb 8 oz (75.1 kg)   SpO2 96%   BMI 29.88 kg/m   Wt Readings from Last 3 Encounters:  04/11/17 165 lb 8 oz (75.1 kg)  03/14/17 163 lb 3.2 oz (74 kg)  10/30/15 145 lb (65.8 kg)    Physical Exam  Constitutional: She appears well-developed and well-nourished.  HENT:  Head: Normocephalic and atraumatic.  Mouth/Throat: Mucous membranes are normal.  Eyes: EOM are   normal. No scleral icterus.  Neck: No JVD present.  Cardiovascular: Normal rate and regular rhythm.   Pulmonary/Chest: Effort normal and breath sounds normal.  Abdominal: She exhibits no distension.  Musculoskeletal: She exhibits no edema.  Skin: No pallor.  Psychiatric: She has a normal mood and affect. Her behavior is normal.   Results for orders placed or performed in visit on 03/14/17  COMPLETE METABOLIC PANEL WITH GFR  Result Value Ref Range   Sodium 138 135 - 146 mmol/L   Potassium 4.8 3.5 - 5.3 mmol/L   Chloride 101 98 - 110 mmol/L   CO2 23 20 - 31 mmol/L   Glucose, Bld 96 65 - 99 mg/dL   BUN 13 7 - 25 mg/dL   Creat 0.99 0.50 - 1.05 mg/dL   Total Bilirubin 0.2 0.2 - 1.2 mg/dL   Alkaline Phosphatase 57 33 - 130 U/L   AST 31 10 - 35 U/L   ALT 22 6 - 29 U/L   Total Protein 6.8 6.1 - 8.1 g/dL   Albumin 4.6 3.6 - 5.1 g/dL   Calcium 10.1 8.6 - 10.4 mg/dL   GFR, Est African American 73 >=60 mL/min   GFR, Est Non African  American 63 >=60 mL/min  Hemoglobin A1c  Result Value Ref Range   Hgb A1c MFr Bld 6.0 (H) <5.7 %   Mean Plasma Glucose 126 mg/dL  Lipid panel  Result Value Ref Range   Cholesterol 212 (H) <200 mg/dL   Triglycerides 459 (H) <150 mg/dL   HDL 44 (L) >50 mg/dL   Total CHOL/HDL Ratio 4.8 <5.0 Ratio   VLDL NOT CALC <30 mg/dL   LDL Cholesterol NOT CALC <100 mg/dL  CBC with Differential/Platelet  Result Value Ref Range   WBC 7.5 3.8 - 10.8 K/uL   RBC 4.75 3.80 - 5.10 MIL/uL   Hemoglobin 13.7 11.7 - 15.5 g/dL   HCT 42.5 35.0 - 45.0 %   MCV 89.5 80.0 - 100.0 fL   MCH 28.8 27.0 - 33.0 pg   MCHC 32.2 32.0 - 36.0 g/dL   RDW 14.1 11.0 - 15.0 %   Platelets 427 (H) 140 - 400 K/uL   MPV 8.2 7.5 - 12.5 fL   Neutro Abs 3,675 1,500 - 7,800 cells/uL   Lymphs Abs 2,775 850 - 3,900 cells/uL   Monocytes Absolute 750 200 - 950 cells/uL   Eosinophils Absolute 225 15 - 500 cells/uL   Basophils Absolute 75 0 - 200 cells/uL   Neutrophils Relative % 49 %   Lymphocytes Relative 37 %   Monocytes Relative 10 %   Eosinophils Relative 3 %   Basophils Relative 1 %   Smear Review Criteria for review not met       Assessment & Plan:   Problem List Items Addressed This Visit      Cardiovascular and Mediastinum   Essential hypertension, benign    Well-controlled today        Other   Prediabetes    Last A1c reviewed with patient; check every 6 months; discussed avoiding/limiting sugary drinks, starches, etc.      Medication monitoring encounter    Check sgpt on statin      Relevant Orders   ALT   Hyperlipidemia    With primary elevation of TG, improved from last per patient; continue statin and fish oil; recheck labs after six weeks of therapy      Relevant Orders   Lipid panel   Elevated platelet count      Very mild elevation; may reflect inflammatory condition, especially with her weight gain of 20 pounds over the last year      Anxiety disorder    Doing well off of benzo; continue the  buproprion          Follow up plan: Return in about 3 weeks (around 04/30/2017) for fasting labs only; visit and fasting labs with Dr. Sanda Klein in Feb 2019.  An after-visit summary was printed and given to the patient at [place].  Please see the patient instructions which may contain other information and recommendations beyond what is mentioned above in the assessment and plan.  No orders of the defined types were placed in this encounter.   Orders Placed This Encounter  Procedures  . Lipid panel  . ALT

## 2017-04-11 NOTE — Assessment & Plan Note (Signed)
With primary elevation of TG, improved from last per patient; continue statin and fish oil; recheck labs after six weeks of therapy

## 2017-04-11 NOTE — Assessment & Plan Note (Signed)
Well controlled today.

## 2017-04-11 NOTE — Assessment & Plan Note (Signed)
Very mild elevation; may reflect inflammatory condition, especially with her weight gain of 20 pounds over the last year

## 2017-04-11 NOTE — Assessment & Plan Note (Signed)
Last A1c reviewed with patient; check every 6 months; discussed avoiding/limiting sugary drinks, starches, etc.

## 2017-04-11 NOTE — Assessment & Plan Note (Signed)
Doing well off of benzo; continue the buproprion

## 2017-05-06 ENCOUNTER — Other Ambulatory Visit: Payer: Self-pay

## 2017-05-06 NOTE — Telephone Encounter (Signed)
Please remind patient that we hoped to see her cholesterol panel around 04/30/17 I'll need to see that to know if the dose of the cholesterol medicine needs to be adjusted She can come in tomorrow morning and have labs back by Thursday

## 2017-05-07 NOTE — Telephone Encounter (Signed)
Left detailed voicemial 

## 2017-05-08 ENCOUNTER — Telehealth: Payer: Self-pay

## 2017-05-08 NOTE — Telephone Encounter (Signed)
Pt called regarding her lovastatin. Informed pt that she will need to come in and having fasting labs done to monitor the medication and her cholesterol. Pt understood and state she will come in tomorrow morning.

## 2017-05-09 ENCOUNTER — Other Ambulatory Visit: Payer: Self-pay

## 2017-05-09 DIAGNOSIS — Z5181 Encounter for therapeutic drug level monitoring: Secondary | ICD-10-CM

## 2017-05-09 DIAGNOSIS — E782 Mixed hyperlipidemia: Secondary | ICD-10-CM

## 2017-05-10 LAB — LIPID PANEL
Cholesterol: 240 mg/dL — ABNORMAL HIGH (ref <200)
HDL: 45 mg/dL — ABNORMAL LOW (ref >50)
Total CHOL/HDL Ratio: 5.3 Ratio — ABNORMAL HIGH (ref <5.0)
Triglycerides: 456 mg/dL — ABNORMAL HIGH (ref <150)

## 2017-05-10 LAB — ALT: ALT: 22 U/L (ref 6–29)

## 2017-05-14 ENCOUNTER — Other Ambulatory Visit: Payer: Self-pay | Admitting: Family Medicine

## 2017-05-14 MED ORDER — ATORVASTATIN CALCIUM 40 MG PO TABS: 40.0000 mg | ORAL_TABLET | Freq: Every day | ORAL | 1 refills | Status: DC

## 2017-05-14 NOTE — Progress Notes (Signed)
Stop mevacor; start atorvastatin Stop fish oil; start krill oil Really watch diet Recheck labs in 6 weeks

## 2017-05-19 ENCOUNTER — Encounter: Payer: Self-pay | Admitting: Family Medicine

## 2017-05-19 ENCOUNTER — Ambulatory Visit (INDEPENDENT_AMBULATORY_CARE_PROVIDER_SITE_OTHER): Payer: BLUE CROSS/BLUE SHIELD | Admitting: Family Medicine

## 2017-05-19 ENCOUNTER — Other Ambulatory Visit: Payer: Self-pay

## 2017-05-19 VITALS — BP 116/72 | HR 89 | Temp 97.7°F | Resp 16 | Wt 164.2 lb

## 2017-05-19 DIAGNOSIS — E782 Mixed hyperlipidemia: Secondary | ICD-10-CM | POA: Diagnosis not present

## 2017-05-19 DIAGNOSIS — H6690 Otitis media, unspecified, unspecified ear: Secondary | ICD-10-CM

## 2017-05-19 MED ORDER — AMOXICILLIN-POT CLAVULANATE 875-125 MG PO TABS: 1.0000 | ORAL_TABLET | Freq: Two times a day (BID) | ORAL | 0 refills | Status: AC

## 2017-05-19 NOTE — Patient Instructions (Signed)
Try vitamin C (orange juice if not diabetic or vitamin C tablets) and drink green tea to help your immune system during your illness Get plenty of rest and hydration Start the antibiotic Please do eat yogurt daily or take a probiotic daily for the next month We want to replace the healthy germs in the gut If you notice foul, watery diarrhea in the next two months, schedule an appointment RIGHT AWAY

## 2017-05-19 NOTE — Assessment & Plan Note (Signed)
She's starting the new medicine; will recheck labs after round to get TG and LDL down

## 2017-05-19 NOTE — Progress Notes (Signed)
BP 116/72   Pulse 89   Temp 97.7 F (36.5 C) (Oral)   Resp 16   Wt 164 lb 3.2 oz (74.5 kg)   SpO2 96%   BMI 29.65 kg/m    Subjective:    Patient ID: Savannah Washington, female    DOB: [birth year], 56 y.o.   MRN: [id number]  HPI: Savannah Washington is a 56 y.o. female  Chief Complaint  Patient presents with  . Cough    started saturday night  . Nasal Congestion    Head and nose congestion going on saturday night  . Ear Fullness    started saturday night  . Chills    No fever or sore throat     HPI Patient is here for an acute visit She started to have a cough on Saturday night (today is Monday) She has also been having head congestion Ear fullness also both ears Bringing up greenish phlegm; not coughing up any blood She has tried: alka-seltzer cold plus; scared to take too much Sick contacts: yes, [place]  Depression screen [facility] 2/9 05/19/2017 04/11/2017 03/14/2017  Decreased Interest 0 0 0  Down, Depressed, Hopeless 0 1 0  PHQ - 2 Score 0 1 0    Relevant past medical, surgical, family and social history reviewed Past Medical History:  Diagnosis Date  . Anxiety   . Depression   . Hyperlipidemia   . Hypertension   . Prediabetes 03/14/2017   History reviewed. No pertinent surgical history. Family History  Problem Relation Age of Onset  . Dementia Mother   . Breast cancer Sister 68  . Aneurysm Father   . Heart attack Brother   . Heart defect Brother   . Brain cancer Daughter   . Heart attack Paternal Grandfather   . Drug abuse Sister    Social History   Social History  . Marital status: Widowed    Spouse name: N/A  . Number of children: N/A  . Years of education: N/A   Occupational History  . Not on file.   Social History Main Topics  . Smoking status: Former Smoker    Packs/day: 0.50    Types: Cigarettes    Quit date: 11/12/2016  . Smokeless tobacco: Never Used  . Alcohol use 3.0 oz/week    1 Glasses of wine, 4 Cans of beer per week  . Drug  use: No  . Sexual activity: No   Other Topics Concern  . Not on file   Social History Narrative  . No narrative on file    Interim medical history since last visit reviewed. Allergies and medications reviewed  Review of Systems Per HPI unless specifically indicated above     Objective:    BP 116/72   Pulse 89   Temp 97.7 F (36.5 C) (Oral)   Resp 16   Wt 164 lb 3.2 oz (74.5 kg)   SpO2 96%   BMI 29.65 kg/m   Wt Readings from Last 3 Encounters:  05/19/17 164 lb 3.2 oz (74.5 kg)  04/11/17 165 lb 8 oz (75.1 kg)  03/14/17 163 lb 3.2 oz (74 kg)    Physical Exam  Constitutional: She appears well-developed and well-nourished.  HENT:  Right Ear: Tympanic membrane is injected and erythematous. A middle ear effusion is present.  Left Ear: Tympanic membrane is injected and erythematous. A middle ear effusion is present.  Nose: Mucosal edema and rhinorrhea present.  Mouth/Throat: Oropharynx is clear and moist and mucous membranes  are normal.  Eyes: EOM are normal. No scleral icterus.  Cardiovascular: Normal rate and regular rhythm.   Pulmonary/Chest: Effort normal and breath sounds normal.  Lymphadenopathy:    She has cervical adenopathy (shoddy).  Skin: She is not diaphoretic. No pallor.  Psychiatric: She has a normal mood and affect. Her behavior is normal.   Diabetic Foot Form - Detailed   Diabetic Foot Exam - detailed Diabetic Foot exam was performed with the following findings:  Yes 05/19/2017  5:07 PM  Visual Foot Exam completed.:  Yes  Pulse Foot Exam completed.:  Yes  Right Dorsalis Pedis:  Present Left Dorsalis Pedis:  Present  Sensory Foot Exam Completed.:  Yes Semmes-Weinstein Monofilament Test R Site 1-Great Toe:  Pos L Site 1-Great Toe:  Pos         Results for orders placed or performed in visit on 05/09/17  Lipid panel  Result Value Ref Range   Cholesterol 240 (H) <200 mg/dL   Triglycerides 456 (H) <150 mg/dL   HDL 45 (L) >50 mg/dL   Total CHOL/HDL  Ratio 5.3 (H) <5.0 Ratio   VLDL NOT CALC <30 mg/dL   LDL Cholesterol NOT CALC <100 mg/dL  ALT  Result Value Ref Range   ALT 22 6 - 29 U/L      Assessment & Plan:   Problem List Items Addressed This Visit      Other   Hyperlipidemia    She's starting the new medicine; will recheck labs after round to get TG and LDL down       Other Visit Diagnoses    Acute otitis media, unspecified otitis media type    -  Primary   start antibiotics; explained risk of C diff; hydrate; out of work today and tmorrow   Relevant Medications   amoxicillin-clavulanate (AUGMENTIN) 875-125 MG tablet       Follow up plan: No Follow-up on file.  An after-visit summary was printed and given to the patient at [place].  Please see the patient instructions which may contain other information and recommendations beyond what is mentioned above in the assessment and plan.  Meds ordered this encounter  Medications  . amoxicillin-clavulanate (AUGMENTIN) 875-125 MG tablet    Sig: Take 1 tablet by mouth 2 (two) times daily.    Dispense:  20 tablet    Refill:  0    No orders of the defined types were placed in this encounter.

## 2017-05-20 ENCOUNTER — Other Ambulatory Visit: Payer: Self-pay

## 2017-05-20 DIAGNOSIS — E782 Mixed hyperlipidemia: Secondary | ICD-10-CM

## 2017-05-20 LAB — LIPID PANEL
CHOL/HDL RATIO: 5.9 ratio — AB (ref <5.0)
Cholesterol: 217 mg/dL — ABNORMAL HIGH (ref <200)
HDL: 37 mg/dL — AB (ref >50)
Triglycerides: 506 mg/dL — ABNORMAL HIGH (ref <150)

## 2017-07-11 ENCOUNTER — Other Ambulatory Visit: Payer: Self-pay | Admitting: Family Medicine

## 2017-07-11 NOTE — Telephone Encounter (Signed)
Please ask patient to come by for a fasting lipid panel We want to see what her cholesterol is doing after she's been on the medicine at least 6 weeks I'll send limited Rx, but don't want to send a whole 30 days, as the dose may change Thank you

## 2017-07-11 NOTE — Telephone Encounter (Signed)
Patient notified will come back in 6 weeks

## 2017-07-14 ENCOUNTER — Other Ambulatory Visit: Payer: Self-pay

## 2017-07-14 DIAGNOSIS — E782 Mixed hyperlipidemia: Secondary | ICD-10-CM

## 2017-07-14 LAB — LIPID PANEL
CHOL/HDL RATIO: 4 (calc) (ref <5.0)
CHOLESTEROL: 167 mg/dL (ref <200)
HDL: 42 mg/dL — AB (ref >50)
NON-HDL CHOLESTEROL (CALC): 125 mg/dL (ref <130)
Triglycerides: 618 mg/dL — ABNORMAL HIGH (ref <150)

## 2017-07-16 ENCOUNTER — Other Ambulatory Visit: Payer: Self-pay | Admitting: Family Medicine

## 2017-07-16 MED ORDER — ICOSAPENT ETHYL 1 G PO CAPS: 2.0000 | ORAL_CAPSULE | Freq: Two times a day (BID) | ORAL | 11 refills | Status: DC

## 2017-07-16 NOTE — Progress Notes (Signed)
Start vascepa in addition to atorvastatin; stop krill or fish oil

## 2017-07-18 ENCOUNTER — Telehealth: Payer: Self-pay

## 2017-07-18 NOTE — Telephone Encounter (Signed)
Patient called ins will not cover vascepa

## 2017-07-18 NOTE — Telephone Encounter (Signed)
Please ask her to try the Vascepa savings card; that should work regardless of insurance coverage; thank you

## 2017-07-21 NOTE — Telephone Encounter (Signed)
Left voicemail about coupon

## 2017-07-22 ENCOUNTER — Other Ambulatory Visit: Payer: Self-pay | Admitting: Family Medicine

## 2017-07-22 DIAGNOSIS — E782 Mixed hyperlipidemia: Secondary | ICD-10-CM

## 2017-07-22 DIAGNOSIS — E781 Pure hyperglyceridemia: Secondary | ICD-10-CM | POA: Insufficient documentation

## 2017-07-22 MED ORDER — OMEGA-3-ACID ETHYL ESTERS 1 G PO CAPS: 2.0000 g | ORAL_CAPSULE | Freq: Two times a day (BID) | ORAL | 11 refills | Status: DC

## 2017-07-22 NOTE — Telephone Encounter (Signed)
Also, patient states the coupon card did not help it is still expensive?

## 2017-07-22 NOTE — Telephone Encounter (Signed)
Please let patient know that we'll try a different medicine for her high triglycerides, since the Vascepa was too expensive This one is called Lovaza I sent the prescription in to her pharmacy Continue the atorvastatin too (so she'll be on two medicines for her cholesterol) Recheck fasting lipids in about 8 weeks (around December 20th); I entered the orders already Thank you

## 2017-07-23 ENCOUNTER — Other Ambulatory Visit: Payer: Self-pay | Admitting: Family Medicine

## 2017-07-23 NOTE — Telephone Encounter (Signed)
Left detailed voicemail

## 2017-07-24 NOTE — Telephone Encounter (Signed)
6 month supply of wellbutrin was prescribed on 03/14/17 She should NOT be out of this; much too soon for refill Please call and make sure she's taking it correctly Thank you

## 2017-07-24 NOTE — Telephone Encounter (Signed)
Called pt, no answer. LM for pt advising that she should not be out of refills and to clarify that she is only taking 1 tablet by mouth daily.

## 2017-07-25 ENCOUNTER — Ambulatory Visit (INDEPENDENT_AMBULATORY_CARE_PROVIDER_SITE_OTHER): Payer: BLUE CROSS/BLUE SHIELD | Admitting: Family Medicine

## 2017-07-25 ENCOUNTER — Encounter: Payer: Self-pay | Admitting: Family Medicine

## 2017-07-25 VITALS — BP 138/82 | HR 92 | Temp 97.4°F | Resp 16 | Wt 163.4 lb

## 2017-07-25 DIAGNOSIS — F411 Generalized anxiety disorder: Secondary | ICD-10-CM | POA: Diagnosis not present

## 2017-07-25 DIAGNOSIS — M25512 Pain in left shoulder: Secondary | ICD-10-CM

## 2017-07-25 DIAGNOSIS — Z6379 Other stressful life events affecting family and household: Secondary | ICD-10-CM | POA: Diagnosis not present

## 2017-07-25 DIAGNOSIS — F1290 Cannabis use, unspecified, uncomplicated: Secondary | ICD-10-CM

## 2017-07-25 DIAGNOSIS — E781 Pure hyperglyceridemia: Secondary | ICD-10-CM | POA: Diagnosis not present

## 2017-07-25 MED ORDER — BUSPIRONE HCL 5 MG PO TABS: 5.0000 mg | ORAL_TABLET | Freq: Two times a day (BID) | ORAL | 2 refills | Status: DC

## 2017-07-25 MED ORDER — CYCLOBENZAPRINE HCL 5 MG PO TABS: 5.0000 mg | ORAL_TABLET | Freq: Three times a day (TID) | ORAL | 0 refills | Status: DC | PRN

## 2017-07-25 NOTE — Assessment & Plan Note (Signed)
Patient is aware that I do not plan on prescribing benzos for her; offered buspirone and she will try that; see AVS for Relaxation Response and tips on managing anxiety; offered counseling, but she politely declined

## 2017-07-25 NOTE — Progress Notes (Signed)
BP 138/82   Pulse 92   Temp (!) 97.4 F (36.3 C) (Oral)   Resp 16   Wt 163 lb 6.4 oz (74.1 kg)   SpO2 91%   BMI 29.50 kg/m    Subjective:    Patient ID: Savannah Washington, female    DOB: 07-13-1961, 56 y.o.   MRN: [id number]  HPI: Savannah Washington is a 56 y.o. female  Chief Complaint  Patient presents with  . Neck Pain    HPI  Here for an acute visit for neck pain, left sided shoulder pain Started about two weeks ago Neck popping just started Thinks she has arthritis in her hips; those hurt like toothaches at times LEFT Sharp throbbing pain that goes up the trap up into the neck Nothing going down the arm No weakness No injury, not picking up anything heavy No rash No change in the hearing No fevers  She has been under significant stress; raising her 86 year old grandson; he has never given her any trouble before now, but he's challenging her authority; she has been raising him since infancy, as her daughter had a drug problem; daughter now in recovery, but not raising him; she thinks he needs a female in his life, recruiting her son to help; recent death in the family as well which has her upset; she used to take Xanax, which she knows I won't prescribe for her; still taking the bupropion  She has high TG; her insurance company would not pay for the Vascepa; she is now on Lovaza and will return for fasting labs in a few months  Depression screen [facility] 2/9 07/25/2017 05/19/2017 04/11/2017 03/14/2017  Decreased Interest 0 0 0 0  Down, Depressed, Hopeless 1 0 1 0  PHQ - 2 Score 1 0 1 0    Relevant past medical, surgical, family and social history reviewed Past Medical History:  Diagnosis Date  . Anxiety   . Depression   . Hyperlipidemia   . Hypertension   . Prediabetes 03/14/2017   History reviewed. No pertinent surgical history. Family History  Problem Relation Age of Onset  . Dementia Mother   . Breast cancer Sister 84  . Aneurysm Father   . Heart attack Brother     . Heart defect Brother   . Brain cancer Daughter   . Heart attack Paternal Grandfather   . Drug abuse Sister    Social History   Social History  . Marital status: Widowed    Spouse name: N/A  . Number of children: N/A  . Years of education: N/A   Occupational History  . Not on file.   Social History Main Topics  . Smoking status: Former Smoker    Packs/day: 0.50    Types: Cigarettes    Quit date: 11/12/2016  . Smokeless tobacco: Never Used  . Alcohol use 3.0 oz/week    1 Glasses of wine, 4 Cans of beer per week  . Drug use: No  . Sexual activity: No   Other Topics Concern  . Not on file   Social History Narrative  . No narrative on file   Interim medical history since last visit reviewed. Allergies and medications reviewed  Review of Systems Per HPI unless specifically indicated above     Objective:    BP 138/82   Pulse 92   Temp (!) 97.4 F (36.3 C) (Oral)   Resp 16   Wt 163 lb 6.4 oz (74.1 kg)   SpO2  91%   BMI 29.50 kg/m   Wt Readings from Last 3 Encounters:  07/25/17 163 lb 6.4 oz (74.1 kg)  05/19/17 164 lb 3.2 oz (74.5 kg)  04/11/17 165 lb 8 oz (75.1 kg)    Physical Exam  Constitutional: She appears well-developed and well-nourished.  HENT:  Mouth/Throat: Mucous membranes are normal.  Eyes: EOM are normal. No scleral icterus.  Neck: Neck supple. Muscular tenderness (LEFT of midline) present. No spinous process tenderness present. No neck rigidity. No erythema and normal range of motion present. No thyromegaly present.  Cardiovascular: Normal rate and regular rhythm.   Pulmonary/Chest: Effort normal and breath sounds normal.  Musculoskeletal:       Cervical back: She exhibits tenderness.       Back:  Trigger point LEFT posterior upper back/shoulder  Skin: No rash (specifically, no rash to suggest shingles in this distribution) noted.  Psychiatric: She has a normal mood and affect. Her behavior is normal.    Results for orders placed or  performed in visit on 07/14/17  Lipid panel  Result Value Ref Range   Cholesterol 167 <200 mg/dL   HDL 42 (L) >50 mg/dL   Triglycerides 618 (H) <150 mg/dL   LDL Cholesterol (Calc)  mg/dL (calc)   Total CHOL/HDL Ratio 4.0 <5.0 (calc)   Non-HDL Cholesterol (Calc) 125 <130 mg/dL (calc)      Assessment & Plan:   Problem List Items Addressed This Visit      Other   Marijuana use    I will not prescribe benzos for this patient      Hypertriglyceridemia    On statin plus lovaza; will be checking fasting lipids soon      Anxiety disorder    Patient is aware that I do not plan on prescribing benzos for her; offered buspirone and she will try that; see AVS for Relaxation Response and tips on managing anxiety; offered counseling, but she politely declined      Relevant Medications   busPIRone (BUSPAR) 5 MG tablet    Other Visit Diagnoses    Trigger point of left shoulder region    -  Primary   increase NSAID and use muscle relaxant, as well as stretching exercises (demonstrated); postpone xrays of C-spine (limiting exposure to radiation over lifetime)   Stressful life events affecting family and household       patient declined counseling       Follow up plan: No Follow-up on file.  An after-visit summary was printed and given to the patient at [place].  Please see the patient instructions which may contain other information and recommendations beyond what is mentioned above in the assessment and plan.  Meds ordered this encounter  Medications  . Turmeric 500 MG CAPS    Sig: Take 500 mg by mouth daily.  . busPIRone (BUSPAR) 5 MG tablet    Sig: Take 1 tablet (5 mg total) by mouth 2 (two) times daily.    Dispense:  60 tablet    Refill:  2  . cyclobenzaprine (FLEXERIL) 5 MG tablet    Sig: Take 1 tablet (5 mg total) by mouth every 8 (eight) hours as needed for muscle spasms.    Dispense:  21 tablet    Refill:  0    No orders of the defined types were placed in this  encounter.

## 2017-07-25 NOTE — Patient Instructions (Addendum)
Take the meloxicam daily and add the muscle relaxer as needed per instructions Try the stretching exercise Gentle heat (NO ICE) to the area Do not drive for 8 hours after taking the muscle relaxant if it makes you goofy or over-medicated Try vitamin C and green tea to help boost your immune system You can start the buspirone for anxiety  12 Ways to Curb Anxiety  ?Anxiety is normal human sensation. It is what helped our ancestors survive the pitfalls of the wilderness. Anxiety is defined as experiencing worry or nervousness about an imminent event or something with an uncertain outcome. It is a feeling experienced by most people at some point in their lives. Anxiety can be triggered by a very personal issue, such as the illness of a loved one, or an event of global proportions, such as a refugee crisis. Some of the symptoms of anxiety are:  Feeling restless.  Having a feeling of impending danger.  Increased heart rate.  Rapid breathing. Sweating.  Shaking.  Weakness or feeling tired.  Difficulty concentrating on anything except the current worry.  Insomnia.  Stomach or bowel problems. What can we do about anxiety we may be feeling? There are many techniques to help manage stress and relax. Here are 12 ways you can reduce your anxiety almost immediately: 1. Turn off the constant feed of information. Take a social media sabbatical. Studies have shown that social media directly contributes to social anxiety.  2. Monitor your television viewing habits. Are you watching shows that are also contributing to your anxiety, such as 24-hour news stations? Try watching something else, or better yet, nothing at all. Instead, listen to music, read an inspirational book or practice a hobby. 3. Eat nutritious meals. Also, don't skip meals and keep healthful snacks on hand. Hunger and poor diet contributes to feeling anxious. 4. Sleep. Sleeping on a regular schedule for at least seven to eight hours a night  will do wonders for your outlook when you are awake. 5. Exercise. Regular exercise will help rid your body of that anxious energy and help you get more restful sleep. 6. Try deep (diaphragmatic) breathing. Inhale slowly through your nose for five seconds and exhale through your mouth. 7. Practice acceptance and gratitude. When anxiety hits, accept that there are things out of your control that shouldn't be of immediate concern.  8. Seek out humor. When anxiety strikes, watch a funny video, read jokes or call a friend who makes you laugh. Laughter is healing for our bodies and releases endorphins that are calming. 9. Stay positive. Take the effort to replace negative thoughts with positive ones. Try to see a stressful situation in a positive light. Try to come up with solutions rather than dwelling on the problem. 10. Figure out what triggers your anxiety. Keep a journal and make note of anxious moments and the events surrounding them. This will help you identify triggers you can avoid or even eliminate. 11. Talk to someone. Let a trusted friend, family member or even trained professional know that you are feeling overwhelmed and anxious. Verbalize what you are feeling and why.  12. Volunteer. If your anxiety is triggered by a crisis on a large scale, become an advocate and work to resolve the problem that is causing you unease. Anxiety is often unwelcome and can become overwhelming. If not kept in check, it can become a disorder that could require medical treatment. However, if you take the time to care for yourself and avoid the triggers  that make you anxious, you will be able to find moments of relaxation and clarity that make your life much more enjoyable.   Steps to Elicit the Relaxation Response The following is the technique reprinted with permission from Dr. Billie Ruddy book The Relaxation Response pages 162-163 1. Sit quietly in a comfortable position. 2. Close your eyes. 3. Deeply  relax all your muscles,  beginning at your feet and progressing up to your face.  Keep them relaxed. 4. Breathe through your nose.  Become aware of your breathing.  As you breathe out, say the word, "one"*,  silently to yourself. For example,  breathe in ... out, "one",- in .. out, "one", etc.  Breathe easily and naturally. 5. Continue for 10 to 20 minutes.  You may open your eyes to check the time, but do not use an alarm.  When you finish, sit quietly for several minutes,  at first with your eyes closed and later with your eyes opened.  Do not stand up for a few minutes. 6. Do not worry about whether you are successful  in achieving a deep level of relaxation.  Maintain a passive attitude and permit relaxation to occur at its own pace.  When distracting thoughts occur,  try to ignore them by not dwelling upon them  and return to repeating "one."  With practice, the response should come with little effort.  Practice the technique once or twice daily,  but not within two hours after any meal,  since the digestive processes seem to interfere with  the elicitation of the Relaxation Response. * It is better to use a soothing, mellifluous sound, preferably with no meaning. or association, to avoid stimulation of unnecessary thoughts - a mantra.

## 2017-07-25 NOTE — Assessment & Plan Note (Signed)
I will not prescribe benzos for this patient

## 2017-07-25 NOTE — Assessment & Plan Note (Signed)
On statin plus lovaza; will be checking fasting lipids soon

## 2017-07-29 ENCOUNTER — Other Ambulatory Visit: Payer: Self-pay

## 2017-07-29 MED ORDER — MELOXICAM 7.5 MG PO TABS: 7.5000 mg | ORAL_TABLET | Freq: Every day | ORAL | 6 refills | Status: DC | PRN

## 2017-07-29 MED ORDER — METFORMIN HCL 1000 MG PO TABS: 1000.0000 mg | ORAL_TABLET | Freq: Two times a day (BID) | ORAL | 6 refills | Status: DC

## 2017-07-29 NOTE — Telephone Encounter (Signed)
June 2018 labs reviewed; Rx approved

## 2017-08-19 ENCOUNTER — Other Ambulatory Visit: Payer: Self-pay | Admitting: Family Medicine

## 2017-08-31 ENCOUNTER — Other Ambulatory Visit: Payer: Self-pay | Admitting: Family Medicine

## 2017-09-01 NOTE — Telephone Encounter (Signed)
Called pt no answer. LM for pt informing her of the need to come in for fasting blood work around Dec. 20th, also informed pt to call back if she is taking medication any differently than once daily. CRM created.

## 2017-09-01 NOTE — Telephone Encounter (Signed)
Please remind patient that she will be due for fasting lipids around Dec 20th I ordered a two month supply of the atorvastatin on 07/22/17, so she should not run out before she gets her labs done I'm not going to approve a 90 day supply yet until we know if the dose is correct; once it's stable, we can do 90 day supplies Please verify that she is taking the atorvastatin as directed to make sure she's not doubling up or something Thank you

## 2017-09-03 ENCOUNTER — Ambulatory Visit: Payer: BLUE CROSS/BLUE SHIELD | Admitting: Family Medicine

## 2017-09-03 ENCOUNTER — Ambulatory Visit
Admission: RE | Admit: 2017-09-03 | Discharge: 2017-09-03 | Disposition: A | Discharge status: Home / Self Care | Payer: BLUE CROSS/BLUE SHIELD | Source: Ambulatory Visit | Attending: Family Medicine | Admitting: Family Medicine

## 2017-09-03 ENCOUNTER — Encounter: Payer: Self-pay | Admitting: Family Medicine

## 2017-09-03 VITALS — BP 128/80 | HR 89 | Temp 97.8°F | Ht 61.0 in | Wt 165.6 lb

## 2017-09-03 DIAGNOSIS — M2480 Other specific joint derangements of unspecified joint, not elsewhere classified: Secondary | ICD-10-CM | POA: Diagnosis not present

## 2017-09-03 DIAGNOSIS — F411 Generalized anxiety disorder: Secondary | ICD-10-CM

## 2017-09-03 DIAGNOSIS — M62838 Other muscle spasm: Secondary | ICD-10-CM

## 2017-09-03 DIAGNOSIS — E782 Mixed hyperlipidemia: Secondary | ICD-10-CM | POA: Diagnosis not present

## 2017-09-03 DIAGNOSIS — I10 Essential (primary) hypertension: Secondary | ICD-10-CM | POA: Diagnosis not present

## 2017-09-03 DIAGNOSIS — M542 Cervicalgia: Secondary | ICD-10-CM | POA: Diagnosis present

## 2017-09-03 DIAGNOSIS — M5030 Other cervical disc degeneration, unspecified cervical region: Secondary | ICD-10-CM | POA: Insufficient documentation

## 2017-09-03 DIAGNOSIS — R7303 Prediabetes: Secondary | ICD-10-CM

## 2017-09-03 MED ORDER — ATORVASTATIN CALCIUM 40 MG PO TABS: 40.0000 mg | ORAL_TABLET | Freq: Every day | ORAL | 0 refills | Status: DC

## 2017-09-03 MED ORDER — LISINOPRIL 5 MG PO TABS: 5.0000 mg | ORAL_TABLET | Freq: Every day | ORAL | 3 refills | Status: DC

## 2017-09-03 MED ORDER — METHOCARBAMOL 500 MG PO TABS: 500.0000 mg | ORAL_TABLET | Freq: Four times a day (QID) | ORAL | 0 refills | Status: DC | PRN

## 2017-09-03 MED ORDER — BUSPIRONE HCL 10 MG PO TABS: 10.0000 mg | ORAL_TABLET | Freq: Two times a day (BID) | ORAL | 5 refills | Status: DC

## 2017-09-03 NOTE — Progress Notes (Signed)
BP 128/80 (BP Location: Left Arm, Patient Position: Sitting, Cuff Size: Large)   Pulse 89   Temp 97.8 F (36.6 C) (Oral)   Ht 5\' 1"  (1.549 m)   Wt 165 lb 9.6 oz (75.1 kg)   SpO2 97%   BMI 31.29 kg/m    Subjective:    Patient ID: Savannah Washington, female    DOB: [birth year], 56 y.o.   MRN: [id number]  HPI: Savannah Washington is a 56 y.o. female  Chief Complaint  Patient presents with  . Neck Pain    Pt states that this pain is becoming chronic, swelling, as tried exercise   . Medication Refill  . Depression    Pt states that she has had issues with depression here at the holidays     HPI She is here for f/u Persistent tightness in the left side of the neck and shoulder; limited ROM of her neck Not so much pain as tightness, can't move it like she should Hearing the crunching when turning the neck No numbness or tingling down the arm, but does involve upper LEFT shoulder No weakness in the arm on the left; being careful with picking up heavy things She tried muscle relaxers; tried heat and hot showers; takes [facility] but not really helping No recent xrays of neck, we reviewed imaging tab No known DDD or arthritis, maybe sister does  Prediabetes; dry mouth; no blurred vision; taking metformin; no sugary drinks; not much bread; some biscuits  Holidays are tough; she does not want to see a counselor  High cholesterol; on statin; likes cheese; not much sausage; does like hot dogs and bacon, not regularly; not very eggs Lab Results  Component Value Date   CHOL 167 07/14/2017   CHOL 217 (H) 05/19/2017   CHOL 240 (H) 05/09/2017   Lab Results  Component Value Date   HDL 42 (L) 07/14/2017   HDL 37 (L) 05/19/2017   HDL 45 (L) 05/09/2017   Lab Results  Component Value Date   LDLCALC NOT CALC 05/19/2017   LDLCALC NOT CALC 05/09/2017   LDLCALC NOT CALC 03/14/2017   Lab Results  Component Value Date   TRIG 618 (H) 07/14/2017   TRIG 506 (H) 05/19/2017   TRIG 456 (H) 05/09/2017     Lab Results  Component Value Date   CHOLHDL 4.0 07/14/2017   CHOLHDL 5.9 (H) 05/19/2017   CHOLHDL 5.3 (H) 05/09/2017   No results found for: LDLDIRECT   Depression screen [facility] 2/9 09/03/2017 07/25/2017 05/19/2017 04/11/2017 03/14/2017  Decreased Interest 0 0 0 0 0  Down, Depressed, Hopeless 1 1 0 1 0  PHQ - 2 Score 1 1 0 1 0    Relevant past medical, surgical, family and social history reviewed Past Medical History:  Diagnosis Date  . Anxiety   . Depression   . Hyperlipidemia   . Hypertension   . Prediabetes 03/14/2017   History reviewed. No pertinent surgical history. Family History  Problem Relation Age of Onset  . Dementia Mother   . Breast cancer Sister 63  . Aneurysm Father   . Heart attack Brother   . Heart defect Brother   . Brain cancer Daughter   . Heart attack Paternal Grandfather   . Drug abuse Sister    Social History   Tobacco Use  . Smoking status: Former Smoker    Packs/day: 0.50    Types: Cigarettes    Last attempt to quit: 11/12/2016    Years since  quitting: 0.8  . Smokeless tobacco: Never Used  Substance Use Topics  . Alcohol use: Yes    Alcohol/week: 3.0 oz    Types: 1 Glasses of wine, 4 Cans of beer per week  . Drug use: No   Interim medical history since last visit reviewed. Allergies and medications reviewed  Review of Systems  Constitutional: Negative for fever and unexpected weight change.  Musculoskeletal:       Neck and shoulder pain   Per HPI unless specifically indicated above     Objective:    BP 128/80 (BP Location: Left Arm, Patient Position: Sitting, Cuff Size: Large)   Pulse 89   Temp 97.8 F (36.6 C) (Oral)   Ht 5\' 1"  (1.549 m)   Wt 165 lb 9.6 oz (75.1 kg)   SpO2 97%   BMI 31.29 kg/m   Wt Readings from Last 3 Encounters:  09/03/17 165 lb 9.6 oz (75.1 kg)  07/25/17 163 lb 6.4 oz (74.1 kg)  05/19/17 164 lb 3.2 oz (74.5 kg)    Physical Exam  Constitutional: She appears well-developed and well-nourished.  HENT:   Mouth/Throat: Mucous membranes are normal.  Eyes: EOM are normal. No scleral icterus.  Neck: Neck supple. Muscular tenderness (LEFT of midline) present. No spinous process tenderness present. No neck rigidity. No erythema and normal range of motion present. No thyromegaly present.  Cardiovascular: Normal rate and regular rhythm.  Pulmonary/Chest: Effort normal and breath sounds normal.  Musculoskeletal:       Cervical back: She exhibits tenderness.       Back:  Tender LEFT posterior upper back/shoulder  Lymphadenopathy:    She has no cervical adenopathy.       Right: No supraclavicular adenopathy present.       Left: No supraclavicular adenopathy present.  Skin: No rash (specifically, no rash to suggest shingles in this distribution) noted.  Psychiatric: She has a normal mood and affect. Her behavior is normal. Her mood appears not anxious. She does not exhibit a depressed mood.    Results for orders placed or performed in visit on 07/14/17  Lipid panel  Result Value Ref Range   Cholesterol 167 <200 mg/dL   HDL 42 (L) >50 mg/dL   Triglycerides 618 (H) <150 mg/dL   LDL Cholesterol (Calc)  mg/dL (calc)   Total CHOL/HDL Ratio 4.0 <5.0 (calc)   Non-HDL Cholesterol (Calc) 125 <130 mg/dL (calc)      Assessment & Plan:   Problem List Items Addressed This Visit      Cardiovascular and Mediastinum   Essential hypertension, benign    controlled      Relevant Medications   atorvastatin (LIPITOR) 40 MG tablet   lisinopril (PRINIVIL,ZESTRIL) 5 MG tablet     Other   Prediabetes    Check A1c with other labs in Dec      Relevant Orders   Hemoglobin A1c   Muscle spasm of left shoulder area - Primary    Will start with C-spine images, try different muscle relaxer      Relevant Orders   DG Cervical Spine Complete   Hyperlipidemia    Continue the statin and lovaza; recheck lipids later in Dec 2018; discussed dietary habits      Relevant Medications   atorvastatin (LIPITOR)  40 MG tablet   lisinopril (PRINIVIL,ZESTRIL) 5 MG tablet   Anxiety disorder    Continue wellbutrin; increase buspar; offered counseling which patient declined      Relevant Medications   busPIRone (BUSPAR)  10 MG tablet    Other Visit Diagnoses    Crepitus of cervical spine       start with plain films; consider MRI if needed   Relevant Orders   DG Cervical Spine Complete       Follow up plan: Return in about 6 months (around 03/04/2018) for twenty minute follow-up with fasting labs.  An after-visit summary was printed and given to the patient at [place].  Please see the patient instructions which may contain other information and recommendations beyond what is mentioned above in the assessment and plan.  Meds ordered this encounter  Medications  . methocarbamol (ROBAXIN) 500 MG tablet    Sig: Take 1 tablet (500 mg total) by mouth every 6 (six) hours as needed for muscle spasms.    Dispense:  30 tablet    Refill:  0  . atorvastatin (LIPITOR) 40 MG tablet    Sig: Take 1 tablet (40 mg total) by mouth at bedtime.    Dispense:  30 tablet    Refill:  0    Please consider 90 day supplies to promote better adherence  . lisinopril (PRINIVIL,ZESTRIL) 5 MG tablet    Sig: Take 1 tablet (5 mg total) by mouth daily.    Dispense:  90 tablet    Refill:  3  . busPIRone (BUSPAR) 10 MG tablet    Sig: Take 1 tablet (10 mg total) by mouth 2 (two) times daily.    Dispense:  60 tablet    Refill:  5    Increasing dose    Orders Placed This Encounter  Procedures  . DG Cervical Spine Complete  . Hemoglobin A1c

## 2017-09-03 NOTE — Assessment & Plan Note (Signed)
controlled 

## 2017-09-03 NOTE — Assessment & Plan Note (Signed)
Will start with C-spine images, try different muscle relaxer

## 2017-09-03 NOTE — Assessment & Plan Note (Signed)
Check A1c with other labs in Dec

## 2017-09-03 NOTE — Patient Instructions (Addendum)
Return around 12/20 for fasting labs You can have the xrays done across the street today Try the new muscle relaxant / pain medicine Okay to take meloxicam too Increase the buspirone to 10 mg twice a day  12 Ways to Curb Anxiety  ?Anxiety is normal human sensation. It is what helped our ancestors survive the pitfalls of the wilderness. Anxiety is defined as experiencing worry or nervousness about an imminent event or something with an uncertain outcome. It is a feeling experienced by most people at some point in their lives. Anxiety can be triggered by a very personal issue, such as the illness of a loved one, or an event of global proportions, such as a refugee crisis. Some of the symptoms of anxiety are:  Feeling restless.  Having a feeling of impending danger.  Increased heart rate.  Rapid breathing. Sweating.  Shaking.  Weakness or feeling tired.  Difficulty concentrating on anything except the current worry.  Insomnia.  Stomach or bowel problems. What can we do about anxiety we may be feeling? There are many techniques to help manage stress and relax. Here are 12 ways you can reduce your anxiety almost immediately: 1. Turn off the constant feed of information. Take a social media sabbatical. Studies have shown that social media directly contributes to social anxiety.  2. Monitor your television viewing habits. Are you watching shows that are also contributing to your anxiety, such as 24-hour news stations? Try watching something else, or better yet, nothing at all. Instead, listen to music, read an inspirational book or practice a hobby. 3. Eat nutritious meals. Also, don't skip meals and keep healthful snacks on hand. Hunger and poor diet contributes to feeling anxious. 4. Sleep. Sleeping on a regular schedule for at least seven to eight hours a night will do wonders for your outlook when you are awake. 5. Exercise. Regular exercise will help rid your body of that anxious energy and  help you get more restful sleep. 6. Try deep (diaphragmatic) breathing. Inhale slowly through your nose for five seconds and exhale through your mouth. 7. Practice acceptance and gratitude. When anxiety hits, accept that there are things out of your control that shouldn't be of immediate concern.  8. Seek out humor. When anxiety strikes, watch a funny video, read jokes or call a friend who makes you laugh. Laughter is healing for our bodies and releases endorphins that are calming. 9. Stay positive. Take the effort to replace negative thoughts with positive ones. Try to see a stressful situation in a positive light. Try to come up with solutions rather than dwelling on the problem. 10. Figure out what triggers your anxiety. Keep a journal and make note of anxious moments and the events surrounding them. This will help you identify triggers you can avoid or even eliminate. 11. Talk to someone. Let a trusted friend, family member or even trained professional know that you are feeling overwhelmed and anxious. Verbalize what you are feeling and why.  12. Volunteer. If your anxiety is triggered by a crisis on a large scale, become an advocate and work to resolve the problem that is causing you unease. Anxiety is often unwelcome and can become overwhelming. If not kept in check, it can become a disorder that could require medical treatment. However, if you take the time to care for yourself and avoid the triggers that make you anxious, you will be able to find moments of relaxation and clarity that make your life much more enjoyable.  Let me know if your symptoms continue and I'll order more scans or get you to a neck specialist

## 2017-09-03 NOTE — Assessment & Plan Note (Signed)
Continue the statin and lovaza; recheck lipids later in Dec 2018; discussed dietary habits

## 2017-09-03 NOTE — Assessment & Plan Note (Signed)
Continue wellbutrin; increase buspar; offered counseling which patient declined

## 2017-09-04 ENCOUNTER — Other Ambulatory Visit: Payer: Self-pay

## 2017-09-04 ENCOUNTER — Encounter: Payer: Self-pay | Admitting: Family Medicine

## 2017-09-04 DIAGNOSIS — M47812 Spondylosis without myelopathy or radiculopathy, cervical region: Secondary | ICD-10-CM

## 2017-09-04 DIAGNOSIS — M5030 Other cervical disc degeneration, unspecified cervical region: Secondary | ICD-10-CM

## 2017-09-04 HISTORY — DX: Other cervical disc degeneration, unspecified cervical region: M50.30

## 2017-09-04 HISTORY — DX: Spondylosis without myelopathy or radiculopathy, cervical region: M47.812

## 2017-09-16 ENCOUNTER — Ambulatory Visit: Payer: BLUE CROSS/BLUE SHIELD | Attending: Family Medicine

## 2017-09-16 VITALS — BP 143/98 | HR 75

## 2017-09-16 DIAGNOSIS — M5412 Radiculopathy, cervical region: Secondary | ICD-10-CM | POA: Diagnosis present

## 2017-09-16 DIAGNOSIS — M6281 Muscle weakness (generalized): Secondary | ICD-10-CM

## 2017-09-16 DIAGNOSIS — M542 Cervicalgia: Secondary | ICD-10-CM

## 2017-09-16 NOTE — Therapy (Signed)
[place] [facility] [facility] [address], [place], [address] Phone: [phone]   Fax:  [phone]  Physical Therapy Evaluation  Patient Details  Name: Savannah Washington MRN: [id number] Date of Birth: 1960/12/20 Referring Provider: Enid Derry, MD   Encounter Date: 09/16/2017  PT End of Session - 09/16/17 0810    Visit Number  1    Number of Visits  13    Date for PT Re-Evaluation  10/30/17    Authorization Type  1    Authorization Time Period  of 30 insurance visits    PT Start Time  [phone]    PT Stop Time  0907    PT Time Calculation (min)  56 min    Activity Tolerance  Patient tolerated treatment well    Behavior During Therapy  [facility] for tasks assessed/performed       Past Medical History:  Diagnosis Date  . Anxiety   . Cervical arthritis 09/04/2017   xrays Dec 2018  . Degenerative disc disease, cervical 09/04/2017   Xrays Dec 2018  . Depression   . Hyperlipidemia   . Hypertension   . Prediabetes 03/14/2017    No past surgical history on file.  Vitals:   09/16/17 0824  BP: (!) 143/98  Pulse: 75     Subjective Assessment - 09/16/17 0815    Subjective  Neck pain: 4/10 currently, 8/10 at worst for the past month. L shoulder: 4/10 currently, 8/10 at worst for the past month.     Pertinent History  Neck and L shoulder pain. Pt states living with the pain for years, feeling needles at the top of her back (thoracic spine area). About a month and a half ago, pt starting feeling popping and grinding in her neck.  Denies tingling or numbness. Pt states being in a lot of stress (pt lost a daughter 15 years ago, and lost her husband 2 years ago, and also lost her mother fairly recently).  Not currently seeing a counselor.  Has family and friends to help her. Pt is R hand dominant.   Feels like there is a muscle at the top R corner of her L shoulder blade that causes it.     Patient Stated Goals  Be able to move her head  more comfortably.     Currently in Pain?  Yes    Pain Score  4     Pain Location  -- neck and L shoulder    Pain Orientation  Posterior    Pain Type  Acute pain    Pain Onset  More than a month ago    Pain Frequency  Occasional    Aggravating Factors   turning her head to the L > R, raising her L arm up (bothers her sligthly)    Pain Relieving Factors  Heat         [facility] PT Assessment - 09/16/17 0806      Assessment   Medical Diagnosis  DDD cervical    Referring Provider  Enid Derry, MD    Onset Date/Surgical Date  09/04/17 Date PT referral signed    Hand Dominance  Right    Prior Therapy  No known PT for current condition.       Precautions   Precaution Comments  No known precautions      Restrictions   Other Position/Activity Restrictions  No known restrictions      Balance Screen   Has the patient  fallen in the past 6 months  No    Has the patient had a decrease in activity level because of a fear of falling?   No    Is the patient reluctant to leave their home because of a fear of falling?   No      Home Environment   Additional Comments  Pt lives in a mobile home with family.  4 steps to enter, no rails.       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       Observation/Other Assessments   Observations  No change in symptoms with cervical compression or distraction. (-) Alar ligament test, Unable to perform vertebral artery test secondary to neck pain     Neck Disability Index   18%      Posture/Postural Control   Posture Comments  Bilaterally protracted shoulders (L > R) and neck, L shoulder higher and more protracted, L lateral shift, decreased lordosis. Movement preference around C5/C6 area      AROM   Overall AROM Comments  Funtional AROM WFL. L upper trap burning sensation with functional ER, L medial inferior scapular burning sensation with  functional IR. L shoulder joint popping with L shoulder abduction and flexion.     Cervical Flexion  WFL central mid thoracic spine pull with overpressure    Cervical Extension  limited No pain with overpressure    Cervical - Right Side Bend  limited with L upper trap area pulling (reproduction of symptoms    Cervical - Left Side Bend  limited with popping and grinding in her neck    Cervical - Right Rotation  full no pain with overpressure    Cervical - Left Rotation  limited with L upper trap pull (reproduction of symptoms)      Strength   Right Shoulder Flexion  4/5    Right Shoulder ABduction  4/5    Left Shoulder Flexion  4/5    Left Shoulder ABduction  4/5    Right Elbow Flexion  4-/5    Right Elbow Extension  5/5    Left Elbow Flexion  4-/5    Left Elbow Extension  5/5    Right Wrist Extension  4/5    Left Wrist Extension  4/5      Palpation   Palpation comment  Tense bilateral upper trap. R cervical paraspinal muscles more tense than L side.              Objective measurements completed on examination: See above findings.   Ther-ex  Bilateral scapular retraction at corner wall to promote upper thoracic and lower cervical extension 10x5 seconds for 2 sets    Then with open books 10x2 seconds   chin tuck 10x5 seconds  Reviewed HEP. Pt demonstrated and verbalized understanding. Handout provided.    Improved exercise technique, movement at target joints, use of target muscles after mod verbal, visual, tactile cues.   Pt states neck feeling more loose after session.            PT Education - 09/16/17 1200    Education provided  Yes    Education Details  ther-ex, HEP, plan of care    Person(s) Educated  Patient    Methods  Explanation;Demonstration;Tactile cues;Verbal cues;Handout    Comprehension  Returned demonstration;Verbalized understanding  PT Long Term Goals - 09/16/17 1207      PT LONG TERM GOAL #1   Title  Pt will have a decrease  in neck/ upper thoracic pain to 4/10 or less at worst to promote ability to look around.     Baseline  8/10 neck/upper thoracic pain at worst for the past month (09/16/2017)    Time  6    Period  Weeks    Status  New    Target Date  10/30/17      PT LONG TERM GOAL #2   Title  Patient will have a decrease in L shoulder pain to 4/10 or less at worst to promote ability to perform functional tasks.     Baseline  8/10 L shoulder (upper trap pain) at worst for the past month (09/16/2017).     Time  6    Period  Weeks    Status  New    Target Date  10/30/17      PT LONG TERM GOAL #3   Title  Pt will improve her Neck Disability Index score by at least 10% as a demonstration of improved function.     Baseline  18% (09/16/2017)    Time  6    Period  Weeks    Status  New    Target Date  10/30/17      PT LONG TERM GOAL #4   Title  Pt will report less difficulty turing her head to promote ability to look around such as when driving.     Baseline  Difficulty turning her head secondary to pain (09/16/2017)    Time  6    Period  Weeks    Status  New    Target Date  10/30/17             Plan - 09/16/17 1203    Clinical Impression Statement  Patient is a 56 year old female who came to physical therapy secondary to neck and L shoulder pain. She also presents with reproduction of symptoms with R and L cervical side bending and L cervical rotation movement; poor posture, weakness, tense bilateral upper trap muscles and cervical paraspinal muscles, and movement preference to around C5/C6 area. Patient will benefit from skilled physical therapy services to address the aforementioned deficits.      History and Personal Factors relevant to plan of care:  Chronicity of condition, pain, stress levels    Clinical Presentation  Stable    Clinical Presentation due to:  Pain is the same per pt medical intake form    Clinical Decision Making  Low    Rehab Potential  Good    Clinical Impairments  Affecting Rehab Potential  (-) chronicity of condition, pain, stress; (+) motivated    PT Frequency  2x / week    PT Duration  6 weeks    PT Treatment/Interventions  Manual techniques;Therapeutic exercise;Therapeutic activities;Dry needling;Patient/family education;Neuromuscular re-education;Electrical Stimulation;Iontophoresis 4mg /ml Dexamethasone;Traction;Ultrasound;Aquatic Therapy traction if appropriate    PT Next Visit Plan  thoracic extension, scapular strengthening, anterior cervical muscle use, modalities PRN    Consulted and Agree with Plan of Care  Patient       Patient will benefit from skilled therapeutic intervention in order to improve the following deficits and impairments:  Pain, Decreased strength, Postural dysfunction  Visit Diagnosis: Cervicalgia - Plan: PT plan of care cert/re-cert  Muscle weakness (generalized) - Plan: PT plan of care cert/re-cert  Radiculopathy, cervical region - Plan: PT plan of  care cert/re-cert     Problem List Patient Active Problem List   Diagnosis Date Noted  . Degenerative disc disease, cervical 09/04/2017  . Cervical arthritis 09/04/2017  . Muscle spasm of left shoulder area 09/03/2017  . Hypertriglyceridemia 07/22/2017  . Elevated platelet count 04/11/2017  . Marijuana use 03/17/2017  . Prediabetes 03/14/2017  . Essential hypertension, benign 03/14/2017  . Hyperlipidemia 03/14/2017  . Medication monitoring encounter 03/14/2017  . Anxiety disorder 03/14/2017    Joneen Boers PT, DPT   09/16/2017, 12:40 PM  [place] [facility] [address], [place], [address] Phone: [phone]   Fax:  [phone]  Name: Savannah Washington MRN: [id number] Date of Birth: 01-10-1961

## 2017-09-16 NOTE — Patient Instructions (Addendum)
     Scapular Retraction (Standing)   Back against the corner wall  With arms at sides, pinch shoulder blades together. Keep your shoulder blades from shrugging.   Hold for 5 seconds. Repeat __10__ times per set. Do __3__ sets per session.     Copyright  VHI. All rights reserved.     Upper Cervical Flexion / Extension  Do not follow first picture Gently flex and extend upper neck by nodding head. Try to make a "long neck" or "double chin." Hold ___5_ seconds. Repeat _10___ times per set. Do 3____ sets per session. Do ___1_ sessions per day.  http://orth.exer.us/351   Copyright  VHI. All rights reserved.

## 2017-09-18 ENCOUNTER — Telehealth: Payer: Self-pay | Admitting: Family Medicine

## 2017-09-18 ENCOUNTER — Ambulatory Visit: Payer: BLUE CROSS/BLUE SHIELD

## 2017-09-18 DIAGNOSIS — M5412 Radiculopathy, cervical region: Secondary | ICD-10-CM

## 2017-09-18 DIAGNOSIS — M542 Cervicalgia: Secondary | ICD-10-CM

## 2017-09-18 DIAGNOSIS — M6281 Muscle weakness (generalized): Secondary | ICD-10-CM

## 2017-09-18 NOTE — Patient Instructions (Addendum)
  Supine open books    Lying on your back, knees bent:   Open your arms wide (diagonally)  to feel your shoulder blade muscles squeeze behind you.   Hold for 5 seconds.    Repeat 10 times   Perform 3 sets daily.     Gave supine cervical nodding and rotation with head on deflated ball 1 min x 3 each as part of her HEP. Pt demonstrated and verbalized understanding.

## 2017-09-18 NOTE — Telephone Encounter (Signed)
Let's get her to an orthopaedist then I'm sorry the medicine did not help her pain Please enter referral to clinic of choice In the meantime, she can use OTC lidocaine topically (there is an aspercreme that contains lidocaine, or plain lidocaine is also available; she can ask her pharmacist to help her locate if she can't find it on the shelves)

## 2017-09-18 NOTE — Telephone Encounter (Signed)
Copied from [place] [id number]. Topic: Quick Communication - See Telephone Encounter >> Sep 18, 2017 12:13 PM Percell Belt A wrote: CRM for notification. See Telephone encounter for: pt called in and said that she was the [place] to take and she says that it is not helping.  She says that she can not tell any difference.  She would like to know what other options she may have .  This is the only info she would give me  Best number [phone]  09/18/17.

## 2017-09-18 NOTE — Telephone Encounter (Signed)
Patient notified, referral placed.

## 2017-09-18 NOTE — Therapy (Signed)
[place] [facility] [address], [place], [address] Phone: [phone]   Fax:  [phone]  Physical Therapy Treatment  Patient Details  Name: Savannah Washington MRN: [id number] Date of Birth: 18-Mar-1961 Referring Provider: Enid Derry, MD   Encounter Date: 09/18/2017  PT End of Session - 09/18/17 0854    Visit Number  2    Number of Visits  13    Date for PT Re-Evaluation  10/30/17    Authorization Type  2    Authorization Time Period  of 30 insurance visits    PT Start Time  [phone]    PT Stop Time  [phone]    PT Time Calculation (min)  51 min    Activity Tolerance  Patient tolerated treatment well    Behavior During Therapy  [facility] for tasks assessed/performed       Past Medical History:  Diagnosis Date  . Anxiety   . Cervical arthritis 09/04/2017   xrays Dec 2018  . Degenerative disc disease, cervical 09/04/2017   Xrays Dec 2018  . Depression   . Hyperlipidemia   . Hypertension   . Prediabetes 03/14/2017    History reviewed. No pertinent surgical history.  There were no vitals filed for this visit.  Subjective Assessment - 09/18/17 0858    Subjective  Neck and L shoulder area is a little sore. 4/10 currently... just a "nag." The HEP is going ok. Has been doing them.     Pertinent History  Neck and L shoulder pain. Pt states living with the pain for years, feeling needles at the top of her back (thoracic spine area). About a month and a half ago, pt starting feeling popping and grinding in her neck.  Denies tingling or numbness. Pt states being in a lot of stress (pt lost a daughter 15 years ago, and lost her husband 2 years ago, and also lost her mother fairly recently).  Not currently seeing a counselor.  Has family and friends to help her. Pt is R hand dominant.   Feels like there is a muscle at the top R corner of her L shoulder blade that causes it.     Patient Stated Goals  Be able to move her head more  comfortably.     Currently in Pain?  Yes    Pain Score  4     Pain Onset  More than a month ago                              PT Education - 09/18/17 0912    Education provided  Yes    Education Details  ther-ex, HEP    Person(s) Educated  Patient    Methods  Explanation;Demonstration;Tactile cues;Verbal cues;Handout    Comprehension  Returned demonstration;Verbalized understanding         Objective    Ther-ex  Manually resisted L scapular retraction targeting the lower trap muscles 10x3 with 5 second holds  Supine open books 10x3 with 5 second holds to promote pectoralis stretch and thoracic extension    Reviewed and given as part of her HEP. Pt demonstrated and verbalized understanding.   Supine chin tucks 10x3 with 5 second holds with bilateral scapular retraction to decrease lower cervical pressure  Standing bilateral scapular retraction rows resisting yellow band 10x3 with 5 second holds   Standing low rows resisting yellow band 10x3 with 5 seconds  Supine with with head supported on deflated ball     Cervical nodding 1 minute x 2    Cervical rotation 1 minute x 2     Neck feels more loose afterwards per pt.    Improved exercise technique, movement at target joints, use of target muscles after min to mod verbal, visual, tactile cues.    Manual therapy  Seated on massage chair: STM L upper trap and rhomboid muscle to decrease tension.      Neck and L shoulder area feels loose.    Worked on decreasing L upper trap muscle tension, improving thoracic extension, and and gentle activation of anterior cervical muscles. Pt states neck and L shoulder feeling loose after session.     PT Long Term Goals - 09/16/17 1207      PT LONG TERM GOAL #1   Title  Pt will have a decrease in neck/ upper thoracic pain to 4/10 or less at worst to promote ability to look around.     Baseline  8/10 neck/upper thoracic pain at worst for the past month  (09/16/2017)    Time  6    Period  Weeks    Status  New    Target Date  10/30/17      PT LONG TERM GOAL #2   Title  Patient will have a decrease in L shoulder pain to 4/10 or less at worst to promote ability to perform functional tasks.     Baseline  8/10 L shoulder (upper trap pain) at worst for the past month (09/16/2017).     Time  6    Period  Weeks    Status  New    Target Date  10/30/17      PT LONG TERM GOAL #3   Title  Pt will improve her Neck Disability Index score by at least 10% as a demonstration of improved function.     Baseline  18% (09/16/2017)    Time  6    Period  Weeks    Status  New    Target Date  10/30/17      PT LONG TERM GOAL #4   Title  Pt will report less difficulty turing her head to promote ability to look around such as when driving.     Baseline  Difficulty turning her head secondary to pain (09/16/2017)    Time  6    Period  Weeks    Status  New    Target Date  10/30/17            Plan - 09/18/17 0854    Clinical Impression Statement  Worked on decreasing L upper trap muscle tension, improving thoracic extension, and and gentle activation of anterior cervical muscles. Pt states neck and L shoulder feeling loose after session.     History and Personal Factors relevant to plan of care:  Chronicity of condition, pain, stress levels     Clinical Presentation  Stable    Clinical Presentation due to:  Pt states neck and L shoulder feeling more loose after session    Clinical Decision Making  Low    Rehab Potential  Good    Clinical Impairments Affecting Rehab Potential  (-) chronicity of condition, pain, stress; (+) motivated    PT Frequency  2x / week    PT Duration  6 weeks    PT Treatment/Interventions  Manual techniques;Therapeutic exercise;Therapeutic activities;Dry needling;Patient/family education;Neuromuscular re-education;Electrical Stimulation;Iontophoresis 4mg /ml Dexamethasone;Traction;Ultrasound;Aquatic Therapy traction if  appropriate  PT Next Visit Plan  thoracic extension, scapular strengthening, anterior cervical muscle use, modalities PRN    Consulted and Agree with Plan of Care  Patient       Patient will benefit from skilled therapeutic intervention in order to improve the following deficits and impairments:  Pain, Decreased strength, Postural dysfunction  Visit Diagnosis: Cervicalgia  Muscle weakness (generalized)  Radiculopathy, cervical region     Problem List Patient Active Problem List   Diagnosis Date Noted  . Degenerative disc disease, cervical 09/04/2017  . Cervical arthritis 09/04/2017  . Muscle spasm of left shoulder area 09/03/2017  . Hypertriglyceridemia 07/22/2017  . Elevated platelet count 04/11/2017  . Marijuana use 03/17/2017  . Prediabetes 03/14/2017  . Essential hypertension, benign 03/14/2017  . Hyperlipidemia 03/14/2017  . Medication monitoring encounter 03/14/2017  . Anxiety disorder 03/14/2017   Joneen Boers PT, DPT   09/18/2017, 1:55 PM  [place] [facility] [address], [place], [address] Phone: [phone]   Fax:  [phone]  Name: Savannah Washington MRN: [id number] Date of Birth: 05-Apr-1961

## 2017-09-18 NOTE — Telephone Encounter (Signed)
Please advise. Thanks.  

## 2017-09-19 ENCOUNTER — Other Ambulatory Visit: Payer: Self-pay

## 2017-09-19 DIAGNOSIS — R7303 Prediabetes: Secondary | ICD-10-CM

## 2017-09-19 DIAGNOSIS — E781 Pure hyperglyceridemia: Secondary | ICD-10-CM

## 2017-09-19 DIAGNOSIS — E782 Mixed hyperlipidemia: Secondary | ICD-10-CM

## 2017-09-20 LAB — LIPID PANEL
CHOLESTEROL: 175 mg/dL (ref <200)
HDL: 43 mg/dL — ABNORMAL LOW (ref >50)
LDL Cholesterol (Calc): 91 mg/dL (calc)
Non-HDL Cholesterol (Calc): 132 mg/dL (calc) — ABNORMAL HIGH (ref <130)
Total CHOL/HDL Ratio: 4.1 (calc) (ref <5.0)
Triglycerides: 310 mg/dL — ABNORMAL HIGH (ref <150)

## 2017-09-20 LAB — HEMOGLOBIN A1C
HEMOGLOBIN A1C: 6.2 %{Hb} — AB (ref <5.7)
Mean Plasma Glucose: 131 (calc)
eAG (mmol/L): 7.3 (calc)

## 2017-09-22 ENCOUNTER — Other Ambulatory Visit: Payer: Self-pay | Admitting: Family Medicine

## 2017-09-22 ENCOUNTER — Telehealth: Payer: Self-pay

## 2017-09-22 MED ORDER — ATORVASTATIN CALCIUM 40 MG PO TABS: 40.0000 mg | ORAL_TABLET | Freq: Every day | ORAL | 1 refills | Status: DC

## 2017-09-22 NOTE — Telephone Encounter (Signed)
Called pt no answer. LM for pt informing her of lab results per Dr.Lada. Pt to call back for questions or concerns. CRM created. Labs routed to [facility].

## 2017-09-22 NOTE — Progress Notes (Signed)
Continue statin, refills sent

## 2017-09-22 NOTE — Telephone Encounter (Signed)
-----   Message from Arnetha Courser, MD sent at 09/22/2017  8:50 AM EST ----- Please let pt know that she has brought her TG down from 618 to 310; that's almost 50% drop! Really try to eat less starchy foods like french fries, potatoes, rice, pasta, fried foods, and sweets. Also, try to lose about 10-15 pounds and walk more (build up slowly and gradully). I hope healthy diet, weight loss, and activity in addition to the meds will get her to goal. Merry Christmas! Thank you

## 2017-09-24 ENCOUNTER — Other Ambulatory Visit: Payer: Self-pay | Admitting: Family Medicine

## 2017-10-01 ENCOUNTER — Telehealth: Payer: Self-pay

## 2017-10-01 ENCOUNTER — Ambulatory Visit: Payer: BLUE CROSS/BLUE SHIELD | Attending: Family Medicine

## 2017-10-01 NOTE — Telephone Encounter (Signed)
No show. Called and left a message pertaining to today's appointment and a reminder for her next follow up session. Return phone call requested. Phone number [phone] provided.  

## 2017-10-02 ENCOUNTER — Telehealth: Payer: Self-pay

## 2017-10-02 NOTE — Telephone Encounter (Signed)
Referral has been sent to [facility] for review. Patient can call their office at [phone] to check the status of her referral.  Copied from [place] [id number]. Topic: Inquiry >> Oct 02, 2017 10:04 AM Savannah Washington wrote: Reason for CRM: Patient called about a referral to a Orthopedic Surgery. Patient stated she have not heard from anyone and she would like to know the status of the referral. Patient is requesting a call back @ [phone].

## 2017-10-06 NOTE — Telephone Encounter (Signed)
Can you double check on this for me, I can place new referral if need be. Thanks!

## 2017-10-06 NOTE — Telephone Encounter (Signed)
Pt has contacted [facility] and they are stating the referral hasnt been received.

## 2017-10-06 NOTE — Telephone Encounter (Signed)
I re-faxed the referral with the confirmation from the 3rd to [phone]. I then called [facility] and left a message for the referral coordination Jenny Reichmann to give me a call back regarding this issue.

## 2017-10-08 ENCOUNTER — Ambulatory Visit: Payer: BLUE CROSS/BLUE SHIELD

## 2017-10-13 ENCOUNTER — Ambulatory Visit: Payer: BLUE CROSS/BLUE SHIELD

## 2017-10-15 ENCOUNTER — Ambulatory Visit: Payer: BLUE CROSS/BLUE SHIELD

## 2017-10-20 ENCOUNTER — Other Ambulatory Visit: Payer: Self-pay | Admitting: Family Medicine

## 2017-10-20 NOTE — Telephone Encounter (Signed)
Please ask patient to contact the orthopaedist; I referred her to [facility] clinic ortho on 09/18/17 Thank you

## 2017-10-20 NOTE — Telephone Encounter (Signed)
Left detailed voicemail

## 2017-10-23 ENCOUNTER — Other Ambulatory Visit: Payer: Self-pay | Admitting: Family Medicine

## 2017-10-23 NOTE — Telephone Encounter (Signed)
Staff already left a detailed voicemail, asking patient to contact the new treating physician; we referred her to [facility] in Dec, don't want two treating physicians

## 2017-11-03 ENCOUNTER — Other Ambulatory Visit: Payer: Self-pay | Admitting: Family Medicine

## 2017-11-03 NOTE — Telephone Encounter (Signed)
5 mg request for buspar came in; denied She should be using 10 mg strength; plenty of refills left on that

## 2017-11-15 NOTE — Progress Notes (Signed)
Closing out lab/order note open since:  05/09/17

## 2017-11-15 NOTE — Progress Notes (Signed)
Closing out lab/order note open since:  09/19/17

## 2017-11-15 NOTE — Progress Notes (Signed)
Closing out lab/order note open since:  07/14/17

## 2017-11-17 ENCOUNTER — Ambulatory Visit: Payer: BLUE CROSS/BLUE SHIELD | Admitting: Family Medicine

## 2017-12-05 ENCOUNTER — Ambulatory Visit: Payer: BLUE CROSS/BLUE SHIELD | Admitting: Family Medicine

## 2017-12-05 ENCOUNTER — Encounter: Payer: Self-pay | Admitting: Family Medicine

## 2017-12-05 DIAGNOSIS — R7989 Other specified abnormal findings of blood chemistry: Secondary | ICD-10-CM

## 2017-12-05 DIAGNOSIS — R7303 Prediabetes: Secondary | ICD-10-CM | POA: Diagnosis not present

## 2017-12-05 DIAGNOSIS — Z87891 Personal history of nicotine dependence: Secondary | ICD-10-CM | POA: Diagnosis not present

## 2017-12-05 DIAGNOSIS — Z5181 Encounter for therapeutic drug level monitoring: Secondary | ICD-10-CM | POA: Diagnosis not present

## 2017-12-05 DIAGNOSIS — E66811 Obesity, class 1: Secondary | ICD-10-CM

## 2017-12-05 DIAGNOSIS — M5030 Other cervical disc degeneration, unspecified cervical region: Secondary | ICD-10-CM | POA: Diagnosis not present

## 2017-12-05 DIAGNOSIS — F411 Generalized anxiety disorder: Secondary | ICD-10-CM | POA: Diagnosis not present

## 2017-12-05 DIAGNOSIS — J449 Chronic obstructive pulmonary disease, unspecified: Secondary | ICD-10-CM | POA: Diagnosis not present

## 2017-12-05 DIAGNOSIS — E669 Obesity, unspecified: Secondary | ICD-10-CM

## 2017-12-05 DIAGNOSIS — E781 Pure hyperglyceridemia: Secondary | ICD-10-CM | POA: Diagnosis not present

## 2017-12-05 MED ORDER — FLUTICASONE FUROATE-VILANTEROL 100-25 MCG/INH IN AEPB: 1.0000 | INHALATION_SPRAY | Freq: Every day | RESPIRATORY_TRACT | 5 refills | Status: DC

## 2017-12-05 MED ORDER — BUSPIRONE HCL 15 MG PO TABS: 15.0000 mg | ORAL_TABLET | Freq: Two times a day (BID) | ORAL | 5 refills | Status: DC

## 2017-12-05 NOTE — Assessment & Plan Note (Signed)
Check A1c; fasting glucose; diet and weight loss encouraged

## 2017-12-05 NOTE — Assessment & Plan Note (Signed)
Cannot prescribe Xanax here but willing to refer to psych; patient declined; may use buspar; offered hydroxyzine pamoate instead if she wants

## 2017-12-05 NOTE — Patient Instructions (Addendum)
Let us know if you are using the buspar; we can increase it if you are already taking it; start back on it if you are not currently taking it Check out the information at familydoctor.org entitled "Nutrition for Weight Loss: What You Need to Know about Fad Diets" Try to lose between 1-2 pounds per week by taking in fewer calories and burning off more calories You can succeed by limiting portions, limiting foods dense in calories and fat, becoming more active, and drinking 8 glasses of water a day (64 ounces) Don't skip meals, especially breakfast, as skipping meals may alter your metabolism Do not use over-the-counter weight loss pills or gimmicks that claim rapid weight loss A healthy BMI (or body mass index) is between 18.5 and 24.9 You can calculate your ideal BMI at the [place] website [employer]Mon[employer].fr We'll get labs today We'll have you see the orthopaedist again We'll get the chest CT If you have not heard anything from my staff in a week about any orders/referrals/studies from today, please contact us here to follow-up [phone]

## 2017-12-05 NOTE — Progress Notes (Signed)
BP 122/80   Pulse 79   Temp 98 F (36.7 C) (Oral)   Resp 16   Wt 168 lb 9.6 oz (76.5 kg)   SpO2 93%   BMI 31.86 kg/m    Subjective:    Patient ID: Savannah Washington, female    DOB: 1961/02/11, 57 y.o.   MRN: [id number]  HPI: Savannah Washington is a 57 y.o. female  Chief Complaint  Patient presents with  . Follow-up  . Neck Pain    HPI  Neck Pain Neck pain that radiates down left shoulder has been ongoing for a few months. Has tried PT and muscle relaxer with no relief of symptoms. Patient was seen at [facility] clinic on 10/17/17 and given a prednisone taper- patient states she didn't notice a difference in pain. X-rays were completed 09/03/2017 that noted Mild degenerative disc and facet disease changes of the cervical spine. Patient states pain has moved to left side of back and describes it as a burning pain. Patient states feels better with massage but cannot tell any change of pain with muscle relaxer. Pt states she takes BC powder and occasional ibuprofen. Pt instructed to take or the other not both.   [facility] orth[facility] note reviewed, copied: Impression: Osteoarthritis of spine with radiculopathy, cervical region [M47.22] Osteoarthritis of spine with radiculopathy, cervical region (primary encounter diagnosis)  Plan:  1. Treatment options were discussed today with the patient. 2. The patient was given exercises for the cervical spine for at home. She was also given a 10-day prednisone taper. 3. She was instructed to continue to work on cervical spine exercises provided to her by physical therapy at home as well.  4. If continued discomfort we will discuss referral back for formal physical therapy versus MRI of the cervical spine to discuss ESI. 5. The patient will follow-up on a as needed basis if symptoms persist. They can call the clinic they have any questions, new symptoms develop or symptoms worsen.   COPD Patient takes albuterol 2 puffs BID. Patient quit smoking last  February and states cough has decreased. Patient states she does deep breathing exercises.  Patient states uses nebulizer rarely. Quit smoking last year  Hyperlipidemia States is not good right now. Patient states she just bought a bunch of fruits and vegetables and has cut out sodas and chips. Patient is motivated to lose weight and eat healthy. She has been jump-roping. Patient taking lipitor bedtime and omega-3 BID; not taking the lovaza two pills BID; likes fried foods Lab Results  Component Value Date   CHOL 175 09/19/2017   HDL 43 (L) 09/19/2017   LDLCALC NOT CALC 05/19/2017   TRIG 310 (H) 09/19/2017   CHOLHDL 4.1 09/19/2017   Depression Mood stabilizing overall states wouldn't get off this for anything. Patients states anxiety is out of control at times and requested for xanax- because it had helped a lot. Patient is unsure if she is currently taking buspar but she thinks she is.   Prediabetes; does like homemade biscuits Lab Results  Component Value Date   HGBA1C 6.2 (H) 09/19/2017   Obesity; gained weight  Depression; asked about xanax; taking buspar, but not sure if taking; used to take xanax every day BID for 15 years; believes she has stress now with working and caring for teenager  Depression screen [facility] 2/9 12/05/2017 09/03/2017 07/25/2017 05/19/2017 04/11/2017  Decreased Interest 0 0 0 0 0  Down, Depressed, Hopeless 1 1 1  0 1  PHQ - 2  Score 1 1 1  0 1    Relevant past medical, surgical, family and social history reviewed Past Medical History:  Diagnosis Date  . Anxiety   . Cervical arthritis 09/04/2017   xrays Dec 2018  . Degenerative disc disease, cervical 09/04/2017   Xrays Dec 2018  . Depression   . Hyperlipidemia   . Hypertension   . Prediabetes 03/14/2017   History reviewed. No pertinent surgical history. Family History  Problem Relation Age of Onset  . Dementia Mother   . Breast cancer Sister 3  . Aneurysm Father   . Heart attack Brother   . Heart defect  Brother   . Brain cancer Daughter   . Heart attack Paternal Grandfather   . Drug abuse Sister    Social History   Tobacco Use  . Smoking status: Former Smoker    Packs/day: 0.50    Types: Cigarettes    Last attempt to quit: 11/12/2016    Years since quitting: 1.0  . Smokeless tobacco: Never Used  Substance Use Topics  . Alcohol use: Yes    Alcohol/week: 3.0 oz    Types: 1 Glasses of wine, 4 Cans of beer per week  . Drug use: No    Interim medical history since last visit reviewed. Allergies and medications reviewed  Review of Systems Per HPI unless specifically indicated above     Objective:    BP 122/80   Pulse 79   Temp 98 F (36.7 C) (Oral)   Resp 16   Wt 168 lb 9.6 oz (76.5 kg)   SpO2 93%   BMI 31.86 kg/m   Wt Readings from Last 3 Encounters:  12/05/17 168 lb 9.6 oz (76.5 kg)  09/03/17 165 lb 9.6 oz (75.1 kg)  07/25/17 163 lb 6.4 oz (74.1 kg)    Physical Exam  Constitutional: She appears well-developed and well-nourished.  HENT:  Mouth/Throat: Mucous membranes are normal.  Eyes: EOM are normal. No scleral icterus.  Neck: Neck supple. Muscular tenderness (LEFT of midline) present. No spinous process tenderness present. No neck rigidity. No erythema and normal range of motion present. No thyromegaly present.  Cardiovascular: Normal rate and regular rhythm.  Pulmonary/Chest: Effort normal and breath sounds normal.  Musculoskeletal:       Cervical back: She exhibits tenderness.       Back:       Arms: Tender LEFT posterior upper back/shoulder  Lymphadenopathy:    She has no cervical adenopathy.       Right: No supraclavicular adenopathy present.       Left: No supraclavicular adenopathy present.  Skin: No rash (specifically, no rash to suggest shingles in this distribution) noted.  Psychiatric: She has a normal mood and affect. Her behavior is normal. Her mood appears not anxious. She does not exhibit a depressed mood.   Results for orders placed or  performed in visit on 09/19/17  Lipid panel  Result Value Ref Range   Cholesterol 175 <200 mg/dL   HDL 43 (L) >50 mg/dL   Triglycerides 310 (H) <150 mg/dL   LDL Cholesterol (Calc) 91 mg/dL (calc)   Total CHOL/HDL Ratio 4.1 <5.0 (calc)   Non-HDL Cholesterol (Calc) 132 (H) <130 mg/dL (calc)  Hemoglobin A1c  Result Value Ref Range   Hgb A1c MFr Bld 6.2 (H) <5.7 % of total Hgb   Mean Plasma Glucose 131 (calc)   eAG (mmol/L) 7.3 (calc)      Assessment & Plan:   Problem List Items Addressed This  Visit      Respiratory   COPD (chronic obstructive pulmonary disease) ([place])    So glad she quit smoking      Relevant Medications   fluticasone furoate-vilanterol (BREO ELLIPTA) 100-25 MCG/INH AEPB     Musculoskeletal and Integument   Degenerative disc disease, cervical    Reviewed [facility] ortho note; explained next option would be MRI w/wo ESI; offered for her to go back for this; refer back      Relevant Orders   Ambulatory referral to Orthopedic Surgery     Other   Prediabetes    Check A1c; fasting glucose; diet and weight loss encouraged      Relevant Orders   Hemoglobin A1c   Obesity (BMI 30.0-34.9)    Encouraged weigh tloss      Medication monitoring encounter    Check liver and kidneys      Relevant Orders   COMPLETE METABOLIC PANEL WITH GFR   Hypertriglyceridemia    Taking lovaza but only half dose; check lipids today, weight loss encouraged      Relevant Orders   Lipid panel   History of tobacco use, presenting hazards to health    Recommend CT scan chest; order      Relevant Orders   CT CHEST LUNG CA SCREEN LOW DOSE W/O CM   Elevated platelet count    Recheck CBC today      Relevant Orders   CBC with Differential/Platelet   Anxiety disorder    Cannot prescribe Xanax here but willing to refer to psych; patient declined; may use buspar; offered hydroxyzine pamoate instead if she wants          Follow up plan: Return in about 3 months (around  03/07/2018) for follow-up visit with Dr. Sanda Klein.  An after-visit summary was printed and given to the patient at [place].  Please see the patient instructions which may contain other information and recommendations beyond what is mentioned above in the assessment and plan.  Meds ordered this encounter  Medications  . fluticasone furoate-vilanterol (BREO ELLIPTA) 100-25 MCG/INH AEPB    Sig: Inhale 1 puff into the lungs daily.    Dispense:  28 each    Refill:  5    Patient bringing coupon    Orders Placed This Encounter  Procedures  . CT CHEST LUNG CA SCREEN LOW DOSE W/O CM  . Lipid panel  . CBC with Differential/Platelet  . COMPLETE METABOLIC PANEL WITH GFR  . Hemoglobin A1c  . Ambulatory referral to Orthopedic Surgery

## 2017-12-05 NOTE — Assessment & Plan Note (Signed)
Check liver and kidneys 

## 2017-12-05 NOTE — Assessment & Plan Note (Signed)
Encouraged weight loss 

## 2017-12-05 NOTE — Assessment & Plan Note (Addendum)
Taking lovaza but only half dose; check lipids today, weight loss encouraged

## 2017-12-05 NOTE — Assessment & Plan Note (Signed)
Recheck CBC today. 

## 2017-12-05 NOTE — Assessment & Plan Note (Signed)
So glad she quit smoking

## 2017-12-05 NOTE — Assessment & Plan Note (Signed)
Reviewed [facility] ortho note; explained next option would be MRI w/wo ESI; offered for her to go back for this; refer back

## 2017-12-05 NOTE — Assessment & Plan Note (Signed)
Recommend CT scan chest; order

## 2017-12-06 LAB — COMPLETE METABOLIC PANEL WITH GFR
AG Ratio: 2.1 (calc) (ref 1.0–2.5)
ALBUMIN MSPROF: 4.6 g/dL (ref 3.6–5.1)
ALT: 19 U/L (ref 6–29)
AST: 35 U/L (ref 10–35)
Alkaline phosphatase (APISO): 55 U/L (ref 33–130)
BILIRUBIN TOTAL: 0.4 mg/dL (ref 0.2–1.2)
BUN: 11 mg/dL (ref 7–25)
CO2: 29 mmol/L (ref 20–32)
CREATININE: 0.89 mg/dL (ref 0.50–1.05)
Calcium: 10 mg/dL (ref 8.6–10.4)
Chloride: 97 mmol/L — ABNORMAL LOW (ref 98–110)
GFR, EST AFRICAN AMERICAN: 83 mL/min/{1.73_m2} (ref >=60)
GFR, EST NON AFRICAN AMERICAN: 72 mL/min/{1.73_m2} (ref >=60)
Globulin: 2.2 g/dL (calc) (ref 1.9–3.7)
Glucose, Bld: 108 mg/dL — ABNORMAL HIGH (ref 65–99)
Potassium: 4.7 mmol/L (ref 3.5–5.3)
Sodium: 136 mmol/L (ref 135–146)
TOTAL PROTEIN: 6.8 g/dL (ref 6.1–8.1)

## 2017-12-06 LAB — CBC WITH DIFFERENTIAL/PLATELET
BASOS PCT: 1 %
Basophils Absolute: 73 cells/uL (ref 0–200)
EOS ABS: 197 {cells}/uL (ref 15–500)
Eosinophils Relative: 2.7 %
HCT: 39.7 % (ref 35.0–45.0)
HEMOGLOBIN: 13.6 g/dL (ref 11.7–15.5)
LYMPHS ABS: 2409 {cells}/uL (ref 850–3900)
MCH: 30 pg (ref 27.0–33.0)
MCHC: 34.3 g/dL (ref 32.0–36.0)
MCV: 87.4 fL (ref 80.0–100.0)
MPV: 8.8 fL (ref 7.5–12.5)
Monocytes Relative: 8.8 %
Neutro Abs: 3979 cells/uL (ref 1500–7800)
Neutrophils Relative %: 54.5 %
Platelets: 452 10*3/uL — ABNORMAL HIGH (ref 140–400)
RBC: 4.54 10*6/uL (ref 3.80–5.10)
RDW: 13 % (ref 11.0–15.0)
Total Lymphocyte: 33 %
WBC: 7.3 10*3/uL (ref 3.8–10.8)
WBCMIX: 642 {cells}/uL (ref 200–950)

## 2017-12-06 LAB — LIPID PANEL
CHOL/HDL RATIO: 3.8 (calc) (ref <5.0)
Cholesterol: 170 mg/dL (ref <200)
HDL: 45 mg/dL — ABNORMAL LOW (ref >50)
LDL Cholesterol (Calc): 78 mg/dL (calc)
NON-HDL CHOLESTEROL (CALC): 125 mg/dL (ref <130)
Triglycerides: 390 mg/dL — ABNORMAL HIGH (ref <150)

## 2017-12-06 LAB — HEMOGLOBIN A1C
EAG (MMOL/L): 7.4 (calc)
Hgb A1c MFr Bld: 6.3 % of total Hgb — ABNORMAL HIGH (ref <5.7)
Mean Plasma Glucose: 134 (calc)

## 2017-12-07 ENCOUNTER — Other Ambulatory Visit: Payer: Self-pay | Admitting: Family Medicine

## 2017-12-07 MED ORDER — ICOSAPENT ETHYL 1 G PO CAPS: 2.0000 | ORAL_CAPSULE | Freq: Two times a day (BID) | ORAL | 11 refills | Status: DC

## 2017-12-07 NOTE — Progress Notes (Signed)
Switch from lovaza to vascepa

## 2017-12-08 ENCOUNTER — Encounter: Payer: Self-pay | Admitting: Family Medicine

## 2017-12-10 ENCOUNTER — Telehealth: Payer: Self-pay | Admitting: *Deleted

## 2017-12-10 ENCOUNTER — Other Ambulatory Visit: Payer: Self-pay

## 2017-12-10 DIAGNOSIS — Z87891 Personal history of nicotine dependence: Secondary | ICD-10-CM

## 2017-12-10 DIAGNOSIS — D489 Neoplasm of uncertain behavior, unspecified: Secondary | ICD-10-CM

## 2017-12-10 DIAGNOSIS — Z122 Encounter for screening for malignant neoplasm of respiratory organs: Secondary | ICD-10-CM

## 2017-12-10 NOTE — Telephone Encounter (Signed)
Received referral for initial lung cancer screening scan. Contacted patient and obtained smoking history,(former, quit 10/2016, 44 pack year) as well as answering questions related to screening process. Patient denies signs of lung cancer such as weight loss or hemoptysis. Patient denies comorbidity that would prevent curative treatment if lung cancer were found. Patient is scheduled for shared decision making visit and CT scan on 12/23/17.

## 2017-12-10 NOTE — Telephone Encounter (Signed)
Please refer Derm Dx: neoplasm uncertain behavior skin Thank you!

## 2017-12-10 NOTE — Progress Notes (Signed)
Referral placed per mychart message.  

## 2017-12-12 ENCOUNTER — Telehealth: Payer: Self-pay

## 2017-12-12 NOTE — Telephone Encounter (Signed)
I contacted this patient to inform her that each facility that I have sent her Dermatology referral to was not in-network and to ask her if she could contact her insurance to find out who is in network. Then we will be able to process the referral so that their office could call her and schedule an appointment and she agreed.

## 2017-12-22 ENCOUNTER — Encounter: Payer: Self-pay | Admitting: Family Medicine

## 2017-12-22 ENCOUNTER — Encounter: Payer: Self-pay | Admitting: Oncology

## 2017-12-23 ENCOUNTER — Other Ambulatory Visit: Payer: Self-pay | Admitting: Orthopedic Surgery

## 2017-12-23 ENCOUNTER — Inpatient Hospital Stay: Payer: BLUE CROSS/BLUE SHIELD | Attending: Oncology | Admitting: Oncology

## 2017-12-23 ENCOUNTER — Ambulatory Visit
Admission: RE | Admit: 2017-12-23 | Discharge: 2017-12-23 | Disposition: A | Discharge status: Home / Self Care | Payer: BLUE CROSS/BLUE SHIELD | Source: Ambulatory Visit | Attending: Oncology | Admitting: Oncology

## 2017-12-23 DIAGNOSIS — Z122 Encounter for screening for malignant neoplasm of respiratory organs: Secondary | ICD-10-CM | POA: Insufficient documentation

## 2017-12-23 DIAGNOSIS — K760 Fatty (change of) liver, not elsewhere classified: Secondary | ICD-10-CM | POA: Diagnosis not present

## 2017-12-23 DIAGNOSIS — I700 Atherosclerosis of aorta: Secondary | ICD-10-CM | POA: Insufficient documentation

## 2017-12-23 DIAGNOSIS — M5030 Other cervical disc degeneration, unspecified cervical region: Secondary | ICD-10-CM

## 2017-12-23 DIAGNOSIS — Z87891 Personal history of nicotine dependence: Secondary | ICD-10-CM

## 2017-12-23 DIAGNOSIS — J439 Emphysema, unspecified: Secondary | ICD-10-CM | POA: Insufficient documentation

## 2017-12-23 DIAGNOSIS — M4722 Other spondylosis with radiculopathy, cervical region: Secondary | ICD-10-CM

## 2017-12-23 NOTE — Progress Notes (Signed)
In accordance with CMS guidelines, patient has met eligibility criteria including age, absence of signs or symptoms of lung cancer.  Social History   Tobacco Use  . Smoking status: Former Smoker    Packs/day: 1.00    Years: 44.00    Pack years: 44.00    Types: Cigarettes    Last attempt to quit: 11/12/2016    Years since quitting: 1.1  . Smokeless tobacco: Never Used  Substance Use Topics  . Alcohol use: Yes    Alcohol/week: 3.0 oz    Types: 1 Glasses of wine, 4 Cans of beer per week  . Drug use: No     A shared decision-making session was conducted prior to the performance of CT scan. This includes one or more decision aids, includes benefits and harms of screening, follow-up diagnostic testing, over-diagnosis, false positive rate, and total radiation exposure.  Counseling on the importance of adherence to annual lung cancer LDCT screening, impact of co-morbidities, and ability or willingness to undergo diagnosis and treatment is imperative for compliance of the program.  Counseling on the importance of continued smoking cessation for former smokers; the importance of smoking cessation for current smokers, and information about tobacco cessation interventions have been given to patient including [place] and 1800 quit [place] programs.  Written order for lung cancer screening with LDCT has been given to the patient and any and all questions have been answered to the best of my abilities.   Yearly follow up will be coordinated by Burgess Estelle, Thoracic Navigator.  Faythe Casa, NP 12/23/2017 12:00 PM

## 2017-12-29 ENCOUNTER — Telehealth: Payer: Self-pay | Admitting: *Deleted

## 2017-12-29 ENCOUNTER — Encounter: Payer: Self-pay | Admitting: *Deleted

## 2017-12-29 DIAGNOSIS — K760 Fatty (change of) liver, not elsewhere classified: Secondary | ICD-10-CM

## 2017-12-29 DIAGNOSIS — I700 Atherosclerosis of aorta: Secondary | ICD-10-CM | POA: Insufficient documentation

## 2017-12-29 DIAGNOSIS — J439 Emphysema, unspecified: Secondary | ICD-10-CM

## 2017-12-29 HISTORY — DX: Atherosclerosis of aorta: I70.0

## 2017-12-29 HISTORY — DX: Emphysema, unspecified: J43.9

## 2017-12-29 HISTORY — DX: Fatty (change of) liver, not elsewhere classified: K76.0

## 2017-12-29 NOTE — Telephone Encounter (Signed)
Notified patient of LDCT lung cancer screening program results with recommendation for 12 month follow up imaging. Also notified of incidental findings noted below and is encouraged to discuss further with PCP who will receive a copy of this note and/or the CT report. Patient verbalizes understanding.   IMPRESSION: 1. Lung-RADS 2, benign appearance or behavior. Continue annual screening with low-dose chest CT without contrast in 12 months. 2.  Aortic atherosclerosis (ICD10-170.0). 3.  Emphysema (ICD10-J43.9). 4. Hepatic steatosis.

## 2017-12-29 NOTE — Telephone Encounter (Signed)
Please contact patient, recommend that she schedule a visit with me in the next month to discuss her chest CT findings Thank you

## 2017-12-29 NOTE — Telephone Encounter (Signed)
Spoke with patient and scheduled an appt for 01-12-18

## 2018-01-07 ENCOUNTER — Ambulatory Visit
Admission: RE | Admit: 2018-01-07 | Discharge: 2018-01-07 | Disposition: A | Discharge status: Home / Self Care | Payer: BLUE CROSS/BLUE SHIELD | Source: Ambulatory Visit | Attending: Orthopedic Surgery | Admitting: Orthopedic Surgery

## 2018-01-07 DIAGNOSIS — M4722 Other spondylosis with radiculopathy, cervical region: Secondary | ICD-10-CM | POA: Insufficient documentation

## 2018-01-07 DIAGNOSIS — M4802 Spinal stenosis, cervical region: Secondary | ICD-10-CM | POA: Diagnosis not present

## 2018-01-07 DIAGNOSIS — M5030 Other cervical disc degeneration, unspecified cervical region: Secondary | ICD-10-CM | POA: Diagnosis not present

## 2018-01-12 ENCOUNTER — Ambulatory Visit: Payer: BLUE CROSS/BLUE SHIELD | Admitting: Family Medicine

## 2018-01-12 ENCOUNTER — Encounter: Payer: Self-pay | Admitting: Family Medicine

## 2018-01-12 VITALS — BP 118/62 | HR 86 | Temp 97.9°F | Resp 14 | Ht 62.0 in | Wt 170.3 lb

## 2018-01-12 DIAGNOSIS — J432 Centrilobular emphysema: Secondary | ICD-10-CM | POA: Diagnosis not present

## 2018-01-12 DIAGNOSIS — R911 Solitary pulmonary nodule: Secondary | ICD-10-CM | POA: Diagnosis not present

## 2018-01-12 DIAGNOSIS — E782 Mixed hyperlipidemia: Secondary | ICD-10-CM | POA: Diagnosis not present

## 2018-01-12 DIAGNOSIS — R61 Generalized hyperhidrosis: Secondary | ICD-10-CM

## 2018-01-12 DIAGNOSIS — K760 Fatty (change of) liver, not elsewhere classified: Secondary | ICD-10-CM

## 2018-01-12 DIAGNOSIS — I700 Atherosclerosis of aorta: Secondary | ICD-10-CM | POA: Diagnosis not present

## 2018-01-12 MED ORDER — ALBUTEROL SULFATE HFA 108 (90 BASE) MCG/ACT IN AERS: 2.0000 | INHALATION_SPRAY | Freq: Four times a day (QID) | RESPIRATORY_TRACT | 1 refills | Status: DC | PRN

## 2018-01-12 NOTE — Assessment & Plan Note (Signed)
Goal LDL is less than 70; already quit smoking; avoid 2nd hand smoke

## 2018-01-12 NOTE — Assessment & Plan Note (Signed)
Continue the 40 mg statin; patient wishes to try on her own to bring that LDL down another 9 points

## 2018-01-12 NOTE — Patient Instructions (Addendum)
We'll have you see Dr. Genevive Bi Try to limit saturated fats in your diet (bologna, hot dogs, barbeque, cheeseburgers, hamburgers, steak, bacon, sausage, cheese, etc.) and get more fresh fruits, vegetables, and whole grains Cheerios, oatmeal will also help Try to use PLAIN allergy medicine without the decongestant Avoid: phenylephrine, phenylpropanolamine, and pseudoephredine

## 2018-01-12 NOTE — Progress Notes (Signed)
BP 118/62   Pulse 86   Temp 97.9 F (36.6 C) (Oral)   Resp 14   Ht 5\' 2"  (1.575 m)   Wt 170 lb 4.8 oz (77.2 kg)   SpO2 94%   BMI 31.15 kg/m    Subjective:    Patient ID: Savannah Washington, female    DOB: [birth year], 57 y.o.   MRN: [id number]  HPI: Savannah Washington is a 57 y.o. female  Chief Complaint  Patient presents with  . CT results    HPI   CLINICAL DATA:  Former smoker, quit 1 year ago, 59 pack-year history, lung cancer screening.  EXAM: CT CHEST WITHOUT CONTRAST LOW-DOSE FOR LUNG CANCER SCREENING  TECHNIQUE: Multidetector CT imaging of the chest was performed following the standard protocol without IV contrast.  COMPARISON:  12/09/2010.  FINDINGS: Cardiovascular: Atherosclerotic calcification of the arterial vasculature. Heart size normal. No pericardial effusion.  Mediastinum/Nodes: Mediastinal lymph nodes are not enlarged by CT size criteria. Hilar regions are difficult to evaluate without IV contrast but appear grossly unremarkable. No axillary adenopathy. Esophagus is grossly unremarkable.  Lungs/Pleura: Mild centrilobular emphysema. Scarring in the lingula and both lower lobes. 6.5 cm ground-glass nodule in the peripheral right upper lobe. No solid pulmonary nodules. No pleural fluid. Airway is unremarkable.  Upper Abdomen: Visualized portion of the liver is decreased in attenuation diffusely. Visualized portions of the adrenal glands, kidneys, spleen, pancreas and stomach are grossly unremarkable. No upper abdominal adenopathy.  Musculoskeletal: Degenerative changes in the spine. No worrisome lytic or sclerotic lesions.  IMPRESSION: 1. Lung-RADS 2, benign appearance or behavior. Continue annual screening with low-dose chest CT without contrast in 12 months. 2.  Aortic atherosclerosis (ICD10-170.0). 3.  Emphysema (ICD10-J43.9). 4. Hepatic steatosis.   Electronically Signed   By: Lorin Picket M.D.   On: 12/24/2017  10:10   LDL dropped from 91 to 78; trying to eat right  Emphysema; feels short of breath at times; has Breo and rescue inhaler; she is good with it  Fatty liver; working on weight loss  Depression screen [facility] 2/9 01/12/2018 12/05/2017 09/03/2017 07/25/2017 05/19/2017  Decreased Interest 0 0 0 0 0  Down, Depressed, Hopeless 0 1 1 1  0  PHQ - 2 Score 0 1 1 1  0    Relevant past medical, surgical, family and social history reviewed Past Medical History:  Diagnosis Date  . Anxiety   . Aortic atherosclerosis ([place]) 12/29/2017   Chest CT March 2019  . Cervical arthritis 09/04/2017   xrays Dec 2018  . Degenerative disc disease, cervical 09/04/2017   Xrays Dec 2018  . Depression   . Emphysema lung ([place]) 12/29/2017   Chest CT March 2019  . Hepatic steatosis 12/29/2017   Chest CT March 2019  . Hyperlipidemia   . Hypertension   . Prediabetes 03/14/2017   History reviewed. No pertinent surgical history. Family History  Problem Relation Age of Onset  . Dementia Mother   . Breast cancer Sister 96  . Aneurysm Father   . Heart attack Brother   . Heart defect Brother   . Brain cancer Daughter   . Heart attack Paternal Grandfather   . Drug abuse Sister    Social History   Tobacco Use  . Smoking status: Former Smoker    Packs/day: 1.00    Years: 44.00    Pack years: 44.00    Types: Cigarettes    Last attempt to quit: 11/12/2016    Years since quitting: 1.1  .  Smokeless tobacco: Never Used  Substance Use Topics  . Alcohol use: Yes    Alcohol/week: 3.0 oz    Types: 1 Glasses of wine, 4 Cans of beer per week  . Drug use: No    Interim medical history since last visit reviewed. Allergies and medications reviewed  Review of Systems Per HPI unless specifically indicated above     Objective:    BP 118/62   Pulse 86   Temp 97.9 F (36.6 C) (Oral)   Resp 14   Ht 5\' 2"  (1.575 m)   Wt 170 lb 4.8 oz (77.2 kg)   SpO2 94%   BMI 31.15 kg/m   Wt Readings from Last 3 Encounters:   01/12/18 170 lb 4.8 oz (77.2 kg)  12/23/17 170 lb (77.1 kg)  12/05/17 168 lb 9.6 oz (76.5 kg)    Physical Exam  Constitutional: She appears well-developed and well-nourished. No distress.  HENT:  Head: Normocephalic and atraumatic.  Eyes: EOM are normal. No scleral icterus.  Neck: No thyromegaly present.  Cardiovascular: Normal rate, regular rhythm and normal heart sounds.  No murmur heard. Pulmonary/Chest: Effort normal. No respiratory distress. She has wheezes (scant expiratory).  Abdominal: Soft. Bowel sounds are normal. She exhibits no distension.  Musculoskeletal: Normal range of motion. She exhibits no edema.  Neurological: She is alert. She exhibits normal muscle tone.  Skin: Skin is warm and dry. She is not diaphoretic. No pallor.  Psychiatric: She has a normal mood and affect. Her behavior is normal. Judgment and thought content normal.    Results for orders placed or performed in visit on 12/05/17  CBC with Differential/Platelet  Result Value Ref Range   WBC 7.3 3.8 - 10.8 Thousand/uL   RBC 4.54 3.80 - 5.10 Million/uL   Hemoglobin 13.6 11.7 - 15.5 g/dL   HCT 39.7 35.0 - 45.0 %   MCV 87.4 80.0 - 100.0 fL   MCH 30.0 27.0 - 33.0 pg   MCHC 34.3 32.0 - 36.0 g/dL   RDW 13.0 11.0 - 15.0 %   Platelets 452 (H) 140 - 400 Thousand/uL   MPV 8.8 7.5 - 12.5 fL   Neutro Abs 3,979 1,500 - 7,800 cells/uL   Lymphs Abs 2,409 850 - 3,900 cells/uL   WBC mixed population 642 200 - 950 cells/uL   Eosinophils Absolute 197 15 - 500 cells/uL   Basophils Absolute 73 0 - 200 cells/uL   Neutrophils Relative % 54.5 %   Total Lymphocyte 33.0 %   Monocytes Relative 8.8 %   Eosinophils Relative 2.7 %   Basophils Relative 1.0 %  COMPLETE METABOLIC PANEL WITH GFR  Result Value Ref Range   Glucose, Bld 108 (H) 65 - 99 mg/dL   BUN 11 7 - 25 mg/dL   Creat 0.89 0.50 - 1.05 mg/dL   GFR, Est Non African American 72 > OR = 60 mL/min/1.7m2   GFR, Est African American 83 > OR = 60 mL/min/1.6m2    BUN/Creatinine Ratio NOT APPLICABLE 6 - 22 (calc)   Sodium 136 135 - 146 mmol/L   Potassium 4.7 3.5 - 5.3 mmol/L   Chloride 97 (L) 98 - 110 mmol/L   CO2 29 20 - 32 mmol/L   Calcium 10.0 8.6 - 10.4 mg/dL   Total Protein 6.8 6.1 - 8.1 g/dL   Albumin 4.6 3.6 - 5.1 g/dL   Globulin 2.2 1.9 - 3.7 g/dL (calc)   AG Ratio 2.1 1.0 - 2.5 (calc)   Total Bilirubin 0.4 0.2 -  1.2 mg/dL   Alkaline phosphatase (APISO) 55 33 - 130 U/L   AST 35 10 - 35 U/L   ALT 19 6 - 29 U/L  Hemoglobin A1c  Result Value Ref Range   Hgb A1c MFr Bld 6.3 (H) <5.7 % of total Hgb   Mean Plasma Glucose 134 (calc)   eAG (mmol/L) 7.4 (calc)  Lipid panel  Result Value Ref Range   Cholesterol 170 <200 mg/dL   HDL 45 (L) >50 mg/dL   Triglycerides 390 (H) <150 mg/dL   LDL Cholesterol (Calc) 78 mg/dL (calc)   Total CHOL/HDL Ratio 3.8 <5.0 (calc)   Non-HDL Cholesterol (Calc) 125 <130 mg/dL (calc)      Assessment & Plan:   Problem List Items Addressed This Visit      Cardiovascular and Mediastinum   Aortic atherosclerosis ([facility])    Goal LDL is less than 70; already quit smoking; avoid 2nd hand smoke        Respiratory   Emphysema lung ([facility])    Noted on chest CT      Relevant Medications   albuterol (PROVENTIL HFA;VENTOLIN HFA) 108 (90 Base) MCG/ACT inhaler     Digestive   Hepatic steatosis    Work on weigh tloss; try to limit saturated fats        Other   Hyperlipidemia    Continue the 40 mg statin; patient wishes to try on her own to bring that LDL down another 9 points       Other Visit Diagnoses    Lung nodule, solitary    -  Primary   Relevant Orders   Ambulatory referral to Cardiothoracic Surgery   Night sweats       Relevant Orders   Ambulatory referral to Cardiothoracic Surgery       Follow up plan: Return if symptoms worsen or fail to improve.  An after-visit summary was printed and given to the patient at [place].  Please see the patient instructions which may contain other  information and recommendations beyond what is mentioned above in the assessment and plan.  Meds ordered this encounter  Medications  . albuterol (PROVENTIL HFA;VENTOLIN HFA) 108 (90 Base) MCG/ACT inhaler    Sig: Inhale 2 puffs into the lungs every 6 (six) hours as needed for wheezing or shortness of breath.    Dispense:  1 Inhaler    Refill:  1    Orders Placed This Encounter  Procedures  . Ambulatory referral to Cardiothoracic Surgery

## 2018-01-12 NOTE — Assessment & Plan Note (Signed)
Noted on chest CT. 

## 2018-01-12 NOTE — Assessment & Plan Note (Signed)
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

## 2018-01-30 ENCOUNTER — Encounter: Payer: Self-pay | Admitting: Cardiothoracic Surgery

## 2018-01-30 ENCOUNTER — Ambulatory Visit: Payer: BLUE CROSS/BLUE SHIELD | Admitting: Cardiothoracic Surgery

## 2018-01-30 VITALS — BP 155/94 | HR 86 | Temp 97.8°F | Resp 20 | Ht 62.0 in | Wt 165.4 lb

## 2018-01-30 DIAGNOSIS — R918 Other nonspecific abnormal finding of lung field: Secondary | ICD-10-CM | POA: Diagnosis not present

## 2018-01-30 DIAGNOSIS — R911 Solitary pulmonary nodule: Secondary | ICD-10-CM | POA: Diagnosis not present

## 2018-01-30 NOTE — Progress Notes (Signed)
Patient ID: Savannah Washington, female   DOB: 23-Jan-1961, 57 y.o.   MRN: [id number]  Chief Complaint  Patient presents with  . New Patient (Initial Visit)    6.5 ground glass nodule in the peripheral right upper lobe    Referred By Dr. Varney Biles Reason for Referral abnormal chest CT  HPI Location, Quality, Duration, Severity, Timing, Context, Modifying Factors, Associated Signs and Symptoms.  Savannah Washington is a 57 y.o. female.  She has been a smoker for the last 40 years having smoked 1 pack cigarettes a day for that length of time.  Recently she began cutting back and now smokes less than a half a pack a day.  She is gained a significant amount of weight since that time and has gained over 40 pounds since she cut back on her smoking habit.  As part of her overall annual examination she had a chest CT made.  She also had an MRI made of her neck because of some pain she was having in her neck and in her left upper shoulder.  The CT scan of her chest did confirm several abnormalities which I will outline below.  None of these were her suspicious for malignancy at this time.  Of note is that she did have a chest CT back in 2012 and I was able to review that as well and compared to the findings from 2019.  The patient does state that she has a cough on occasion of clear sputum.  She is never had hemoptysis.  She denied any fevers or chills.  She states that she coughs up some whitish phlegm if anything at all.  She gets short of breath with one flight of stairs and states that she would have to stop at that point.  She does not have a family history of lung cancer although she has a friend who died of lung cancer.  She has no known asbestos exposure.   Past Medical History:  Diagnosis Date  . Anxiety   . Aortic atherosclerosis ([place]) 12/29/2017   Chest CT March 2019  . Cervical arthritis 09/04/2017   xrays Dec 2018  . Degenerative disc disease, cervical 09/04/2017   Xrays Dec 2018  . Depression    . Emphysema lung ([place]) 12/29/2017   Chest CT March 2019  . Hepatic steatosis 12/29/2017   Chest CT March 2019  . Hyperlipidemia   . Hypertension   . Prediabetes 03/14/2017    History reviewed. No pertinent surgical history.  Family History  Problem Relation Age of Onset  . Dementia Mother   . Breast cancer Sister 33  . Aneurysm Father   . Heart attack Brother   . Heart defect Brother   . Brain cancer Daughter   . Heart attack Paternal Grandfather   . Drug abuse Sister     Social History Social History   Tobacco Use  . Smoking status: Former Smoker    Packs/day: 1.00    Years: 44.00    Pack years: 44.00    Types: Cigarettes    Last attempt to quit: 11/12/2016    Years since quitting: 1.2  . Smokeless tobacco: Never Used  Substance Use Topics  . Alcohol use: Yes    Alcohol/week: 3.0 oz    Types: 1 Glasses of wine, 4 Cans of beer per week  . Drug use: No    No Known Allergies  Current Outpatient Medications  Medication Sig Dispense Refill  . albuterol (PROVENTIL HFA;VENTOLIN HFA)  108 (90 Base) MCG/ACT inhaler Inhale 2 puffs into the lungs every 6 (six) hours as needed for wheezing or shortness of breath. 1 Inhaler 1  . aspirin EC 81 MG tablet Take 1 tablet (81 mg total) by mouth daily.    Marland Kitchen atorvastatin (LIPITOR) 40 MG tablet Take 1 tablet (40 mg total) by mouth at bedtime. 90 tablet 1  . buPROPion (WELLBUTRIN XL) 300 MG 24 hr tablet TAKE 1 TABLET BY MOUTH ONCE DAILY 90 tablet 1  . busPIRone (BUSPAR) 15 MG tablet Take 1 tablet (15 mg total) by mouth 2 (two) times daily. 60 tablet 5  . calcium-vitamin D (OSCAL WITH D) 250-125 MG-UNIT tablet Take 1 tablet by mouth daily.    . cetirizine (ZYRTEC) 10 MG tablet Take 10 mg by mouth daily.    . fluticasone furoate-vilanterol (BREO ELLIPTA) 100-25 MCG/INH AEPB Inhale 1 puff into the lungs daily. 28 each 5  . Icosapent Ethyl (VASCEPA) 1 g CAPS Take 2 capsules (2 g total) by mouth 2 (two) times daily. 120 capsule 11  .  lisinopril (PRINIVIL,ZESTRIL) 5 MG tablet Take 1 tablet (5 mg total) by mouth daily. 90 tablet 3  . meloxicam (MOBIC) 7.5 MG tablet Take 1 tablet (7.5 mg total) by mouth daily as needed for pain. 30 tablet 6  . metFORMIN (GLUCOPHAGE) 1000 MG tablet Take 1 tablet (1,000 mg total) by mouth 2 (two) times daily with a meal. 60 tablet 6  . methocarbamol (ROBAXIN) 500 MG tablet TAKE 1 TABLET BY MOUTH EVERY 6 HOURS AS NEEDED FOR MUSCLE SPASM 30 tablet 0  . Multiple Vitamins-Minerals (MULTI COMPLETE PO) Take 1,000 Units by mouth daily.    . Turmeric 500 MG CAPS Take 500 mg by mouth daily.     No current facility-administered medications for this visit.       Review of Systems A complete review of systems was asked and was negative except for the following positive findings cough, shortness of breath, wheezing, joint pain, depression, excessive thirst.  Blood pressure (!) 155/94, pulse 86, temperature 97.8 F (36.6 C), temperature source Oral, resp. rate 20, height 5\' 2"  (1.575 m), weight 165 lb 6.4 oz (75 kg), SpO2 92 %.  Physical Exam CONSTITUTIONAL:  Pleasant, well-developed, well-nourished, and in no acute distress. EYES: Pupils equal and reactive to light, Sclera non-icteric EARS, NOSE, MOUTH AND THROAT:  The oropharynx was clear.  Dentition is good repair.  Oral mucosa pink and moist. LYMPH NODES:  Lymph nodes in the neck and axillae were normal RESPIRATORY:  Lungs were clear.  Normal respiratory effort without pathologic use of accessory muscles of respiration CARDIOVASCULAR: Heart was regular without murmurs.  There were no carotid bruits. GI: The abdomen was soft, nontender, and nondistended. There were no palpable masses. There was no hepatosplenomegaly. There were normal bowel sounds in all quadrants. GU:  Rectal deferred.   MUSCULOSKELETAL:  Normal muscle strength and tone.  No clubbing or cyanosis.   SKIN:  There were no pathologic skin lesions.  There were no nodules on  palpation. NEUROLOGIC:  Sensation is normal.  Cranial nerves are grossly intact. PSYCH:  Oriented to person, place and time.  Mood and affect are normal.  Data Reviewed CT scans of the chest  I have personally reviewed the patient's imaging, laboratory findings and medical records.    Assessment    I have independently reviewed her CT scan of the chest which she had made in 2019 and I compared to the scan from 2012.  There are scattered linear densities throughout the lungs most consistent with a benign etiology.  There is a small groundglass lesion in the right upper lobe which will need to be addressed on follow-up.    Plan    I did explain to the patient that currently we would recommend that she have another CT scan done in 1 year.  She is agreeable to having that done.  I also counseled her on smoking cessation and encouraged her to quit as soon as possible.  She understands and would like to come back and see [place] in one years time.       Nestor Lewandowsky, MD 01/30/2018, 9:01 AM

## 2018-01-30 NOTE — Patient Instructions (Addendum)
We have scheduled your one year follow up CT scan 01/29/2019 @ 10:45. NO prep.  Please call our office to schedule a follow up with Dr.Oaks in April 2020.

## 2018-02-04 ENCOUNTER — Ambulatory Visit: Payer: Self-pay | Admitting: Nurse Practitioner

## 2018-02-05 ENCOUNTER — Encounter: Payer: Self-pay | Admitting: Nurse Practitioner

## 2018-02-05 ENCOUNTER — Ambulatory Visit: Payer: BLUE CROSS/BLUE SHIELD | Admitting: Nurse Practitioner

## 2018-02-05 VITALS — BP 124/82 | HR 78 | Temp 98.1°F | Resp 16 | Ht 62.0 in | Wt 166.0 lb

## 2018-02-05 DIAGNOSIS — R7303 Prediabetes: Secondary | ICD-10-CM | POA: Diagnosis not present

## 2018-02-05 DIAGNOSIS — F418 Other specified anxiety disorders: Secondary | ICD-10-CM | POA: Diagnosis not present

## 2018-02-05 DIAGNOSIS — Z87891 Personal history of nicotine dependence: Secondary | ICD-10-CM | POA: Diagnosis not present

## 2018-02-05 DIAGNOSIS — E785 Hyperlipidemia, unspecified: Secondary | ICD-10-CM

## 2018-02-05 MED ORDER — METFORMIN HCL 1000 MG PO TABS: 1000.0000 mg | ORAL_TABLET | Freq: Two times a day (BID) | ORAL | 6 refills | Status: DC

## 2018-02-05 MED ORDER — BUPROPION HCL ER (XL) 300 MG PO TB24: 300.0000 mg | ORAL_TABLET | Freq: Every day | ORAL | 1 refills | Status: DC

## 2018-02-05 MED ORDER — ATORVASTATIN CALCIUM 40 MG PO TABS: 40.0000 mg | ORAL_TABLET | Freq: Every day | ORAL | 1 refills | Status: DC

## 2018-02-05 NOTE — Patient Instructions (Signed)

## 2018-02-05 NOTE — Progress Notes (Signed)
Name: Savannah Washington   MRN: [id number]    DOB: [birth year]   Date:02/05/2018       Progress Note  Subjective  Chief Complaint  Chief Complaint  Patient presents with  . Paperwork    Adoption paperwork assesment. Has had child for 13 years    HPI  Patient here to fill out medical clearance for foster care. Denies any complaints. Discussed weight loss goals for healthy living, last a1c increasing and trigs high. Patient states drinks 5-7 bottles of water, has had increased stress with foster are paperwork and home visit- will work on eating healthier and better portions. Patient is also trying to be more active- she got a swimming pool and is working on set up to be able to use it more. Quit smoking last February- increased stress is a trigger but has been chewing gum a lot to prevent relapse. She is compliant with medications.   Patient Active Problem List   Diagnosis Date Noted  . Emphysema lung ([place]) 12/29/2017  . Aortic atherosclerosis ([place]) 12/29/2017  . Hepatic steatosis 12/29/2017  . COPD (chronic obstructive pulmonary disease) ([place]) 12/05/2017  . History of tobacco use, presenting hazards to health 12/05/2017  . Obesity (BMI 30.0-34.9) 12/05/2017  . Degenerative disc disease, cervical 09/04/2017  . Cervical arthritis 09/04/2017  . Muscle spasm of left shoulder area 09/03/2017  . Hypertriglyceridemia 07/22/2017  . Elevated platelet count 04/11/2017  . Marijuana use 03/17/2017  . Prediabetes 03/14/2017  . Essential hypertension, benign 03/14/2017  . Hyperlipidemia 03/14/2017  . Medication monitoring encounter 03/14/2017  . Anxiety disorder 03/14/2017    Past Medical History:  Diagnosis Date  . Anxiety   . Aortic atherosclerosis ([place]) 12/29/2017   Chest CT March 2019  . Cervical arthritis 09/04/2017   xrays Dec 2018  . Degenerative disc disease, cervical 09/04/2017   Xrays Dec 2018  . Depression   . Emphysema lung ([place]) 12/29/2017   Chest CT March 2019  . Hepatic steatosis  12/29/2017   Chest CT March 2019  . Hyperlipidemia   . Hypertension   . Prediabetes 03/14/2017    No past surgical history on file.  Social History   Tobacco Use  . Smoking status: Former Smoker    Packs/day: 1.00    Years: 44.00    Pack years: 44.00    Types: Cigarettes    Last attempt to quit: 11/12/2016    Years since quitting: 1.2  . Smokeless tobacco: Never Used  Substance Use Topics  . Alcohol use: Yes    Alcohol/week: 3.0 oz    Types: 1 Glasses of wine, 4 Cans of beer per week     Current Outpatient Medications:  .  albuterol (PROVENTIL HFA;VENTOLIN HFA) 108 (90 Base) MCG/ACT inhaler, Inhale 2 puffs into the lungs every 6 (six) hours as needed for wheezing or shortness of breath., Disp: 1 Inhaler, Rfl: 1 .  aspirin EC 81 MG tablet, Take 1 tablet (81 mg total) by mouth daily., Disp: , Rfl:  .  atorvastatin (LIPITOR) 40 MG tablet, Take 1 tablet (40 mg total) by mouth at bedtime., Disp: 90 tablet, Rfl: 1 .  buPROPion (WELLBUTRIN XL) 300 MG 24 hr tablet, TAKE 1 TABLET BY MOUTH ONCE DAILY, Disp: 90 tablet, Rfl: 1 .  busPIRone (BUSPAR) 15 MG tablet, Take 1 tablet (15 mg total) by mouth 2 (two) times daily., Disp: 60 tablet, Rfl: 5 .  calcium-vitamin D (OSCAL WITH D) 250-125 MG-UNIT tablet, Take 1 tablet by mouth daily., Disp: ,  Rfl:  .  cetirizine (ZYRTEC) 10 MG tablet, Take 10 mg by mouth daily., Disp: , Rfl:  .  fluticasone furoate-vilanterol (BREO ELLIPTA) 100-25 MCG/INH AEPB, Inhale 1 puff into the lungs daily., Disp: 28 each, Rfl: 5 .  Icosapent Ethyl (VASCEPA) 1 g CAPS, Take 2 capsules (2 g total) by mouth 2 (two) times daily., Disp: 120 capsule, Rfl: 11 .  lisinopril (PRINIVIL,ZESTRIL) 5 MG tablet, Take 1 tablet (5 mg total) by mouth daily., Disp: 90 tablet, Rfl: 3 .  meloxicam (MOBIC) 7.5 MG tablet, Take 1 tablet (7.5 mg total) by mouth daily as needed for pain., Disp: 30 tablet, Rfl: 6 .  metFORMIN (GLUCOPHAGE) 1000 MG tablet, Take 1 tablet (1,000 mg total) by mouth 2  (two) times daily with a meal., Disp: 60 tablet, Rfl: 6 .  methocarbamol (ROBAXIN) 500 MG tablet, TAKE 1 TABLET BY MOUTH EVERY 6 HOURS AS NEEDED FOR MUSCLE SPASM, Disp: 30 tablet, Rfl: 0 .  Multiple Vitamins-Minerals (MULTI COMPLETE PO), Take 1,000 Units by mouth daily., Disp: , Rfl:  .  Turmeric 500 MG CAPS, Take 500 mg by mouth daily., Disp: , Rfl:   No Known Allergies  ROS  No other specific complaints in a complete review of systems (except as listed in HPI above).  Objective  Vitals:   02/05/18 1130  BP: 124/82  Pulse: 78  Resp: 16  Temp: 98.1 F (36.7 C)  TempSrc: Oral  SpO2: 94%  Weight: 166 lb (75.3 kg)  Height: 5\' 2"  (1.575 m)    Body mass index is 30.36 kg/m.  Nursing Note and Vital Signs reviewed.  Physical Exam  Constitutional: Patient appears well-developed and well-nourished.  No distress.  Cardiovascular: Normal rate, regular rhythm, S1/S2 present.  No murmur or rub heard.  Pulmonary/Chest: Effort normal and breath sounds clear. No respiratory distress or retractions. Psychiatric: Patient has a normal mood and affect. behavior is normal. Judgment and thought content normal.  No results found for this or any previous visit (from the past 72 hour(s)).  Assessment & Plan  1. Hyperlipidemia, unspecified hyperlipidemia type  - atorvastatin (LIPITOR) 40 MG tablet; Take 1 tablet (40 mg total) by mouth at bedtime.  Dispense: 90 tablet; Refill: 1  2. Other specified anxiety disorders  - buPROPion (WELLBUTRIN XL) 300 MG 24 hr tablet; Take 1 tablet (300 mg total) by mouth daily.  Dispense: 90 tablet; Refill: 1  3. Prediabetes  - metFORMIN (GLUCOPHAGE) 1000 MG tablet; Take 1 tablet (1,000 mg total) by mouth 2 (two) times daily with a meal.  Dispense: 60 tablet; Refill: 6  4. History of tobacco use, presenting hazards to health  - buPROPion (WELLBUTRIN XL) 300 MG 24 hr tablet; Take 1 tablet (300 mg total) by mouth daily.  Dispense: 90 tablet; Refill:  1  Completed foster care paperwork and discussed with patient. Will work on diet and exercise for weight loss and follow up with Dr. Sanda Klein next month for routine visit.   Follow up and care instructions discussed and provided in AVS. -Reviewed Health Maintenance: papsmear due

## 2018-03-06 ENCOUNTER — Ambulatory Visit: Payer: BLUE CROSS/BLUE SHIELD | Admitting: Family Medicine

## 2018-03-06 ENCOUNTER — Encounter: Payer: Self-pay | Admitting: Family Medicine

## 2018-03-06 VITALS — BP 122/80 | HR 83 | Temp 98.2°F | Resp 14 | Ht 62.0 in | Wt 161.9 lb

## 2018-03-06 DIAGNOSIS — Z1239 Encounter for other screening for malignant neoplasm of breast: Secondary | ICD-10-CM

## 2018-03-06 DIAGNOSIS — K760 Fatty (change of) liver, not elsewhere classified: Secondary | ICD-10-CM

## 2018-03-06 DIAGNOSIS — J449 Chronic obstructive pulmonary disease, unspecified: Secondary | ICD-10-CM

## 2018-03-06 DIAGNOSIS — Z1231 Encounter for screening mammogram for malignant neoplasm of breast: Secondary | ICD-10-CM | POA: Diagnosis not present

## 2018-03-06 DIAGNOSIS — E782 Mixed hyperlipidemia: Secondary | ICD-10-CM

## 2018-03-06 DIAGNOSIS — F411 Generalized anxiety disorder: Secondary | ICD-10-CM | POA: Diagnosis not present

## 2018-03-06 DIAGNOSIS — Z23 Encounter for immunization: Secondary | ICD-10-CM | POA: Diagnosis not present

## 2018-03-06 DIAGNOSIS — I10 Essential (primary) hypertension: Secondary | ICD-10-CM | POA: Diagnosis not present

## 2018-03-06 DIAGNOSIS — R7303 Prediabetes: Secondary | ICD-10-CM

## 2018-03-06 DIAGNOSIS — I700 Atherosclerosis of aorta: Secondary | ICD-10-CM | POA: Diagnosis not present

## 2018-03-06 MED ORDER — FLUTICASONE-UMECLIDIN-VILANT 100-62.5-25 MCG/INH IN AEPB: 1.0000 | INHALATION_SPRAY | Freq: Every day | RESPIRATORY_TRACT | 12 refills | Status: DC

## 2018-03-06 NOTE — Progress Notes (Signed)
BP 122/80   Pulse 83   Temp 98.2 F (36.8 C) (Oral)   Resp 14   Ht 5\' 2"  (1.575 m)   Wt 161 lb 14.4 oz (73.4 kg)   SpO2 92%   BMI 29.61 kg/m    Subjective:    Patient ID: Savannah Washington, female    DOB: 1961/07/18, 57 y.o.   MRN: [id number]  HPI: Savannah Washington is a 57 y.o. female  Chief Complaint  Patient presents with  . Follow-up    HPI Patient is here for f/u  Prediabetes; DM runs in the family; no dry mouth, no blurred vision; losing weight, watching her diet Lab Results  Component Value Date   HGBA1C 6.3 (H) 12/05/2017   She has high cholesterol; taking 40 mg atorvastatin; watching her diet, losing weight; trying to limit fatty meats; not many french fries; not much fast food Lab Results  Component Value Date   CHOL 170 12/05/2017   HDL 45 (L) 12/05/2017   LDLCALC 78 12/05/2017   TRIG 390 (H) 12/05/2017   CHOLHDL 3.8 12/05/2017   Aortic athero; feet do not turn cold or look purple  She has anxiety and depression; on buspirone and bupropion; it's "better"; it's been rough at times, another death in the family; has someone to talk to, but does better processing things alone  COPD; using SABA every day; has had a few tough days recently; humidity is terrible;   Fatty liver; no abd pain  Neck issues; going back for another shot on Wed, gave a little relief; another shot planned soon; might take 2nd shot to get relief; no weakness in the arm; not running in the family; probably old traumas  Depression screen [facility] 2/9 03/06/2018 01/12/2018 12/05/2017 09/03/2017 07/25/2017  Decreased Interest 0 0 0 0 0  Down, Depressed, Hopeless 1 0 1 1 1   PHQ - 2 Score 1 0 1 1 1   Altered sleeping 0 - - - -  Tired, decreased energy 0 - - - -  Change in appetite 0 - - - -  Feeling bad or failure about yourself  0 - - - -  Trouble concentrating 1 - - - -  Moving slowly or fidgety/restless 0 - - - -  Suicidal thoughts 0 - - - -  PHQ-9 Score 2 - - - -  Difficult doing work/chores  Not difficult at all - - - -    Relevant past medical, surgical, family and social history reviewed Past Medical History:  Diagnosis Date  . Anxiety   . Aortic atherosclerosis ([place]) 12/29/2017   Chest CT March 2019  . Cervical arthritis 09/04/2017   xrays Dec 2018  . Degenerative disc disease, cervical 09/04/2017   Xrays Dec 2018  . Depression   . Emphysema lung ([place]) 12/29/2017   Chest CT March 2019  . Hepatic steatosis 12/29/2017   Chest CT March 2019  . Hyperlipidemia   . Hypertension   . Prediabetes 03/14/2017   History reviewed. No pertinent surgical history. Family History  Problem Relation Age of Onset  . Dementia Mother   . Breast cancer Sister 33  . Aneurysm Father   . Heart attack Brother   . Heart defect Brother   . Brain cancer Daughter   . Heart attack Paternal Grandfather   . Drug abuse Sister    Social History   Tobacco Use  . Smoking status: Former Smoker    Packs/day: 1.00    Years: 44.00  Pack years: 44.00    Types: Cigarettes    Last attempt to quit: 11/12/2016    Years since quitting: 1.3  . Smokeless tobacco: Never Used  Substance Use Topics  . Alcohol use: Yes    Alcohol/week: 3.0 oz    Types: 1 Glasses of wine, 4 Cans of beer per week  . Drug use: No    Interim medical history since last visit reviewed. Allergies and medications reviewed  Review of Systems Per HPI unless specifically indicated above     Objective:    BP 122/80   Pulse 83   Temp 98.2 F (36.8 C) (Oral)   Resp 14   Ht 5\' 2"  (1.575 m)   Wt 161 lb 14.4 oz (73.4 kg)   SpO2 92%   BMI 29.61 kg/m   Wt Readings from Last 3 Encounters:  03/06/18 161 lb 14.4 oz (73.4 kg)  02/05/18 166 lb (75.3 kg)  01/30/18 165 lb 6.4 oz (75 kg)    Physical Exam  Constitutional: She appears well-developed and well-nourished. No distress.  Weight loss noted; overweight  HENT:  Head: Normocephalic and atraumatic.  Eyes: EOM are normal. No scleral icterus.  Neck: No thyromegaly  present.  Cardiovascular: Normal rate, regular rhythm and normal heart sounds.  No murmur heard. Pulmonary/Chest: Effort normal and breath sounds normal. No respiratory distress. She has no wheezes.  Abdominal: Soft. Bowel sounds are normal. She exhibits no distension.  Musculoskeletal: Normal range of motion. She exhibits no edema.  Neurological: She is alert. She exhibits normal muscle tone.  Skin: Skin is warm and dry. Rash (erythematous rash, left cheek medially; no immediately at the [place]; caladryl lotion on top) noted. She is not diaphoretic. No pallor.  Psychiatric: She has a normal mood and affect. Her behavior is normal. Judgment and thought content normal.   Results for orders placed or performed in visit on 12/05/17  CBC with Differential/Platelet  Result Value Ref Range   WBC 7.3 3.8 - 10.8 Thousand/uL   RBC 4.54 3.80 - 5.10 Million/uL   Hemoglobin 13.6 11.7 - 15.5 g/dL   HCT 39.7 35.0 - 45.0 %   MCV 87.4 80.0 - 100.0 fL   MCH 30.0 27.0 - 33.0 pg   MCHC 34.3 32.0 - 36.0 g/dL   RDW 13.0 11.0 - 15.0 %   Platelets 452 (H) 140 - 400 Thousand/uL   MPV 8.8 7.5 - 12.5 fL   Neutro Abs 3,979 1,500 - 7,800 cells/uL   Lymphs Abs 2,409 850 - 3,900 cells/uL   WBC mixed population 642 200 - 950 cells/uL   Eosinophils Absolute 197 15 - 500 cells/uL   Basophils Absolute 73 0 - 200 cells/uL   Neutrophils Relative % 54.5 %   Total Lymphocyte 33.0 %   Monocytes Relative 8.8 %   Eosinophils Relative 2.7 %   Basophils Relative 1.0 %  COMPLETE METABOLIC PANEL WITH GFR  Result Value Ref Range   Glucose, Bld 108 (H) 65 - 99 mg/dL   BUN 11 7 - 25 mg/dL   Creat 0.89 0.50 - 1.05 mg/dL   GFR, Est Non African American 72 > OR = 60 mL/min/1.5m2   GFR, Est African American 83 > OR = 60 mL/min/1.76m2   BUN/Creatinine Ratio NOT APPLICABLE 6 - 22 (calc)   Sodium 136 135 - 146 mmol/L   Potassium 4.7 3.5 - 5.3 mmol/L   Chloride 97 (L) 98 - 110 mmol/L   CO2 29 20 - 32 mmol/L  Calcium 10.0  8.6 - 10.4 mg/dL   Total Protein 6.8 6.1 - 8.1 g/dL   Albumin 4.6 3.6 - 5.1 g/dL   Globulin 2.2 1.9 - 3.7 g/dL (calc)   AG Ratio 2.1 1.0 - 2.5 (calc)   Total Bilirubin 0.4 0.2 - 1.2 mg/dL   Alkaline phosphatase (APISO) 55 33 - 130 U/L   AST 35 10 - 35 U/L   ALT 19 6 - 29 U/L  Hemoglobin A1c  Result Value Ref Range   Hgb A1c MFr Bld 6.3 (H) <5.7 % of total Hgb   Mean Plasma Glucose 134 (calc)   eAG (mmol/L) 7.4 (calc)  Lipid panel  Result Value Ref Range   Cholesterol 170 <200 mg/dL   HDL 45 (L) >50 mg/dL   Triglycerides 390 (H) <150 mg/dL   LDL Cholesterol (Calc) 78 mg/dL (calc)   Total CHOL/HDL Ratio 3.8 <5.0 (calc)   Non-HDL Cholesterol (Calc) 125 <130 mg/dL (calc)      Assessment & Plan:   Problem List Items Addressed This Visit      Cardiovascular and Mediastinum   Essential hypertension, benign (Chronic)    Excellent control; continue meds      Aortic atherosclerosis (HCC) (Chronic)    Goal LDL less than 70; decrease sat fats; continue aspirin and statin      Relevant Orders   Lipid panel     Respiratory   COPD (chronic obstructive pulmonary disease) (HCC) - Primary (Chronic)    Continue meds      Relevant Medications   Fluticasone-Umeclidin-Vilant (TRELEGY ELLIPTA) 100-62.5-25 MCG/INH AEPB     Digestive   Hepatic steatosis (Chronic)    Last liver enzymes were normal; will not recheck today        Other   Prediabetes    Check A1c and glucose; weight loss noted      Relevant Orders   Hemoglobin A1c   Hyperlipidemia    Check lipids today; limit sat fats      Relevant Orders   Lipid panel   Anxiety disorder    Continue meds; see AVS       Other Visit Diagnoses    Screening for breast cancer       Relevant Orders   MM Digital Screening   Need for 23-polyvalent pneumococcal polysaccharide vaccine           Follow up plan: Return in about 6 weeks (around 04/17/2018) for complete physical with pap smear.  An after-visit summary was  printed and given to the patient at [facility].  Please see the patient instructions which may contain other information and recommendations beyond what is mentioned above in the assessment and plan.  Meds ordered this encounter  Medications  . Fluticasone-Umeclidin-Vilant (TRELEGY ELLIPTA) 100-62.5-25 MCG/INH AEPB    Sig: Inhale 1 each into the lungs daily. (this replaces BREO)    Dispense:  1 each    Refill:  12    Orders Placed This Encounter  Procedures  . MM Digital Screening  . Pneumococcal polysaccharide vaccine 23-valent greater than or equal to 2yo subcutaneous/IM  . Hemoglobin A1c  . Lipid panel

## 2018-03-06 NOTE — Assessment & Plan Note (Signed)
Excellent control; continue meds

## 2018-03-06 NOTE — Assessment & Plan Note (Signed)
Continue meds; see AVS

## 2018-03-06 NOTE — Assessment & Plan Note (Signed)
Continue meds. 

## 2018-03-06 NOTE — Assessment & Plan Note (Signed)
Check A1c and glucose; weight loss noted

## 2018-03-06 NOTE — Assessment & Plan Note (Signed)
Goal LDL less than 70; decrease sat fats; continue aspirin and statin

## 2018-03-06 NOTE — Assessment & Plan Note (Signed)
Check lipids today; limit sat fats

## 2018-03-06 NOTE — Patient Instructions (Addendum)
Stop the BREO and start TRELEGY One puff daily for COPD  You have received the Pneumovax vaccine (PPSV-23) and you will get another booster for this after you turn 64  Try to limit saturated fats in your diet (bologna, hot dogs, barbeque, cheeseburgers, hamburgers, steak, bacon, sausage, cheese, etc.) and get more fresh fruits, vegetables, and whole grains  Try to follow the DASH guidelines (DASH stands for Dietary Approaches to Stop Hypertension). Try to limit the sodium in your diet to no more than 1,500mg  of sodium per day. Certainly try to not exceed 2,000 mg per day at the very most. Do not add salt when cooking or at the table.  Check the sodium amount on labels when shopping, and choose items lower in sodium when given a choice. Avoid or limit foods that already contain a lot of sodium. Eat a diet rich in fruits and vegetables and whole grains, and try to lose weight if overweight or obese Food Choices to Lower Your Triglycerides Triglycerides are a type of fat in your blood. High levels of triglycerides can increase the risk of heart disease and stroke. If your triglyceride levels are high, the foods you eat and your eating habits are very important. Choosing the right foods can help lower your triglycerides. What general guidelines do I need to follow?  Lose weight if you are overweight.  Limit or avoid alcohol.  Fill one half of your plate with vegetables and green salads.  Limit fruit to two servings a day. Choose fruit instead of juice.  Make one fourth of your plate whole grains. Look for the word "whole" as the first word in the ingredient list.  Fill one fourth of your plate with lean protein foods.  Enjoy fatty fish (such as salmon, mackerel, sardines, and tuna) three times a week.  Choose healthy fats.  Limit foods high in starch and sugar.  Eat more home-cooked food and less restaurant, buffet, and fast food.  Limit fried foods.  Cook foods using methods other than  frying.  Limit saturated fats.  Check ingredient lists to avoid foods with partially hydrogenated oils (trans fats) in them. What foods can I eat? Grains Whole grains, such as whole wheat or whole grain breads, crackers, cereals, and pasta. Unsweetened oatmeal, bulgur, barley, quinoa, or brown rice. Corn or whole wheat flour tortillas. Vegetables Fresh or frozen vegetables (raw, steamed, roasted, or grilled). Green salads. Fruits All fresh, canned (in natural juice), or frozen fruits. Meat and Other Protein Products Ground beef (85% or leaner), grass-fed beef, or beef trimmed of fat. Skinless chicken or [place]. Ground chicken or [place]. Pork trimmed of fat. All fish and seafood. Eggs. Dried beans, peas, or lentils. Unsalted nuts or seeds. Unsalted canned or dry beans. Dairy Low-fat dairy products, such as skim or 1% milk, 2% or reduced-fat cheeses, low-fat ricotta or cottage cheese, or plain low-fat yogurt. Fats and Oils Tub margarines without trans fats. Light or reduced-fat mayonnaise and salad dressings. Avocado. Safflower, olive, or canola oils. Natural peanut or almond butter. The items listed above may not be a complete list of recommended foods or beverages. Contact your dietitian for more options. What foods are not recommended? Grains White bread. White pasta. White rice. Cornbread. Bagels, pastries, and croissants. Crackers that contain trans fat. Vegetables White potatoes. Corn. Creamed or fried vegetables. Vegetables in a cheese sauce. Fruits Dried fruits. Canned fruit in light or heavy syrup. Fruit juice. Meat and Other Protein Products Fatty cuts of meat. Ribs, chicken  wings, bacon, sausage, bologna, salami, chitterlings, fatback, hot dogs, bratwurst, and packaged luncheon meats. Dairy Whole or 2% milk, cream, half-and-half, and cream cheese. Whole-fat or sweetened yogurt. Full-fat cheeses. Nondairy creamers and whipped toppings. Processed cheese, cheese spreads, or cheese  curds. Sweets and Desserts Corn syrup, sugars, honey, and molasses. Candy. Jam and jelly. Syrup. Sweetened cereals. Cookies, pies, cakes, donuts, muffins, and ice cream. Fats and Oils Butter, stick margarine, lard, shortening, ghee, or bacon fat. Coconut, palm kernel, or palm oils. Beverages Alcohol. Sweetened drinks (such as sodas, lemonade, and fruit drinks or punches). The items listed above may not be a complete list of foods and beverages to avoid. Contact your dietitian for more information. This information is not intended to replace advice given to you by your health care provider. Make sure you discuss any questions you have with your health care provider. Document Released: 07/04/2004 Document Revised: 02/22/2016 Document Reviewed: 07/21/2013 Elsevier Interactive Patient Education  2017 [employer].

## 2018-03-06 NOTE — Assessment & Plan Note (Signed)
Last liver enzymes were normal; will not recheck today

## 2018-03-07 LAB — HEMOGLOBIN A1C
Hgb A1c MFr Bld: 6.2 % of total Hgb — ABNORMAL HIGH (ref <5.7)
Mean Plasma Glucose: 131 (calc)
eAG (mmol/L): 7.3 (calc)

## 2018-03-07 LAB — LIPID PANEL
CHOL/HDL RATIO: 3.9 (calc) (ref <5.0)
CHOLESTEROL: 171 mg/dL (ref <200)
HDL: 44 mg/dL — AB (ref >50)
NON-HDL CHOLESTEROL (CALC): 127 mg/dL (ref <130)
TRIGLYCERIDES: 430 mg/dL — AB (ref <150)

## 2018-05-06 ENCOUNTER — Other Ambulatory Visit: Payer: Self-pay | Admitting: Family Medicine

## 2018-05-08 ENCOUNTER — Encounter: Payer: Self-pay | Admitting: Family Medicine

## 2018-05-08 ENCOUNTER — Ambulatory Visit: Payer: BLUE CROSS/BLUE SHIELD | Admitting: Family Medicine

## 2018-05-08 ENCOUNTER — Other Ambulatory Visit (HOSPITAL_COMMUNITY)
Admission: RE | Admit: 2018-05-08 | Discharge: 2018-05-08 | Disposition: A | Discharge status: Home / Self Care | Payer: BLUE CROSS/BLUE SHIELD | Source: Ambulatory Visit | Attending: Family Medicine | Admitting: Family Medicine

## 2018-05-08 VITALS — BP 132/74 | HR 95 | Temp 98.3°F | Resp 16 | Ht 62.0 in | Wt 153.7 lb

## 2018-05-08 DIAGNOSIS — Z124 Encounter for screening for malignant neoplasm of cervix: Secondary | ICD-10-CM | POA: Insufficient documentation

## 2018-05-08 DIAGNOSIS — Z0000 Encounter for general adult medical examination without abnormal findings: Secondary | ICD-10-CM | POA: Diagnosis not present

## 2018-05-08 DIAGNOSIS — Z87891 Personal history of nicotine dependence: Secondary | ICD-10-CM | POA: Diagnosis not present

## 2018-05-08 MED ORDER — AZITHROMYCIN 250 MG PO TABS: ORAL_TABLET | ORAL | 0 refills | Status: DC

## 2018-05-08 NOTE — Progress Notes (Signed)
BP 132/74   Pulse 95   Temp 98.3 F (36.8 C) (Oral)   Resp 16   Ht _0  (1.575 m)   Wt 153 lb 11.2 oz (69.7 kg)   SpO2 99%   BMI 28.11 kg/m    Subjective:    Patient ID: Savannah Washington, female    DOB: Jul 21, 1961, 57 y.o.   MRN: [id number]  HPI: Savannah Washington is a 57 y.o. female  Chief Complaint  Patient presents with  . Annual Exam    HPI  No med excitement since last visit She does feel like she is getting sick, though; greenish phlegm, productive cough, wheezing  USPSTF grade A and B recommendations Depression:  Depression screen [facility] 2/9 05/08/2018 05/08/2018 03/06/2018 01/12/2018 12/05/2017  Decreased Interest 0 0 0 0 0  Down, Depressed, Hopeless 0 0 1 0 1  PHQ - 2 Score 0 0 1 0 1  Altered sleeping 0 - 0 - -  Tired, decreased energy 0 - 0 - -  Change in appetite 0 - 0 - -  Feeling bad or failure about yourself  0 - 0 - -  Trouble concentrating 0 - 1 - -  Moving slowly or fidgety/restless 0 - 0 - -  Suicidal thoughts 0 - 0 - -  PHQ-9 Score 0 - 2 - -  Difficult doing work/chores - - Not difficult at all - -   Hypertension: BP Readings from Last 3 Encounters:  05/08/18 132/74  03/06/18 122/80  02/05/18 124/82   Obesity: Wt Readings from Last 3 Encounters:  05/08/18 153 lb 11.2 oz (69.7 kg)  03/06/18 161 lb 14.4 oz (73.4 kg)  02/05/18 166 lb (75.3 kg)   BMI Readings from Last 3 Encounters:  05/08/18 28.11 kg/m  03/06/18 29.61 kg/m  02/05/18 30.36 kg/m     Skin cancer: several rough bumps, right hand and left thigh Lung cancer:  Nonsmoker, quit 2018; already one in 2019, due 2020, Dr. Genevive Bi ordering Breast cancer: no lumps; scheduled 05/22/18 Colorectal cancer: 2013; 10 year pass per patient; no fam hx Cervical cancer screening: today; no hx of abnormal BRCA gene screening: family hx of breast and/or ovarian cancer and/or metastatic prostate cancer? One sister had breast cancer 78 or so; no other family members; she was not tested; patient not  interested at this time HIV, hep B, hep C: not interested STD testing and prevention (chl/gon/syphilis): not interested Intimate partner violence: no abuse Contraception: n/a Osteoporosis: no fam hx Fall prevention/vitamin D: discussed Immunizations: discussed shingrix; not interested in tetanus; will get flu shot this fall Diet: mixed diet Exercise: active Alcohol: occasionally Tobacco use: quit 2018 AAA: n/a Aspirin:  The 10-year ASCVD risk score Mikey Bussing DC Jr., et al., 2013) is: 4%   Values used to calculate the score:     Age: 14 years     Sex: Female     Is Non-Hispanic African American: No     Diabetic: No     Tobacco smoker: No     Systolic Blood Pressure: 358 mmHg     Is BP treated: Yes     HDL Cholesterol: 44 mg/dL     Total Cholesterol: 171 mg/dL  Glucose:  Glucose  Date Value Ref Range Status  11/25/2011 123 (H) 65 - 99 mg/dL Final   Glucose, Bld  Date Value Ref Range Status  12/05/2017 108 (H) 65 - 99 mg/dL Final    Comment:    .  Fasting reference interval . For someone without known diabetes, a glucose value between 100 and 125 mg/dL is consistent with prediabetes and should be confirmed with a follow-up test. .   03/14/2017 96 65 - 99 mg/dL Final  10/30/2015 74 65 - 99 mg/dL Final   Lipids:  Lab Results  Component Value Date   CHOL 171 03/06/2018   CHOL 170 12/05/2017   CHOL 175 09/19/2017   Lab Results  Component Value Date   HDL 44 (L) 03/06/2018   HDL 45 (L) 12/05/2017   HDL 43 (L) 09/19/2017   Lab Results  Component Value Date   [facility]  03/06/2018     Comment:     . LDL cholesterol not calculated. Triglyceride levels greater than 400 mg/dL invalidate calculated LDL results. . Reference range: <100 . Desirable range <100 mg/dL for primary prevention;   <70 mg/dL for patients with CHD or diabetic patients  with > or = 2 CHD risk factors. Marland Kitchen LDL-C is now calculated using the Martin-Hopkins  calculation, which is a  validated novel method providing  better accuracy than the Friedewald equation in the  estimation of LDL-C.  Cresenciano Genre et al. Annamaria Helling. 4782;956(21): 2061-2068  (http://education.[facility].com/faq/FAQ164)    LDLCALC 78 12/05/2017   LDLCALC 91 09/19/2017   Lab Results  Component Value Date   TRIG 430 (H) 03/06/2018   TRIG 390 (H) 12/05/2017   TRIG 310 (H) 09/19/2017   Lab Results  Component Value Date   CHOLHDL 3.9 03/06/2018   CHOLHDL 3.8 12/05/2017   CHOLHDL 4.1 09/19/2017   No results found for: LDLDIRECT   Depression screen [facility] 2/9 05/08/2018 05/08/2018 03/06/2018 01/12/2018 12/05/2017  Decreased Interest 0 0 0 0 0  Down, Depressed, Hopeless 0 0 1 0 1  PHQ - 2 Score 0 0 1 0 1  Altered sleeping 0 - 0 - -  Tired, decreased energy 0 - 0 - -  Change in appetite 0 - 0 - -  Feeling bad or failure about yourself  0 - 0 - -  Trouble concentrating 0 - 1 - -  Moving slowly or fidgety/restless 0 - 0 - -  Suicidal thoughts 0 - 0 - -  PHQ-9 Score 0 - 2 - -  Difficult doing work/chores - - Not difficult at all - -    Relevant past medical, surgical, family and social history reviewed Past Medical History:  Diagnosis Date  . Anxiety   . Aortic atherosclerosis ([place]) 12/29/2017   Chest CT March 2019  . Cervical arthritis 09/04/2017   xrays Dec 2018  . Degenerative disc disease, cervical 09/04/2017   Xrays Dec 2018  . Depression   . Emphysema lung ([place]) 12/29/2017   Chest CT March 2019  . Hepatic steatosis 12/29/2017   Chest CT March 2019  . Hyperlipidemia   . Hypertension   . Prediabetes 03/14/2017   History reviewed. No pertinent surgical history. Family History  Problem Relation Age of Onset  . Dementia Mother   . Breast cancer Sister 56  . Aneurysm Father   . Heart attack Brother   . Heart defect Brother   . Brain cancer Daughter   . Heart attack Paternal Grandfather   . Drug abuse Sister    Social History   Tobacco Use  . Smoking status: Former Smoker    Packs/day:  1.00    Years: 44.00    Pack years: 44.00    Types: Cigarettes    Last attempt to quit: 11/12/2016  Years since quitting: 1.5  . Smokeless tobacco: Never Used  Substance Use Topics  . Alcohol use: Yes    Alcohol/week: 5.0 standard drinks    Types: 1 Glasses of wine, 4 Cans of beer per week  . Drug use: No    Interim medical history since last visit reviewed. Allergies and medications reviewed  Review of Systems Per HPI unless specifically indicated above     Objective:    BP 132/74   Pulse 95   Temp 98.3 F (36.8 C) (Oral)   Resp 16   Ht _0  (1.575 m)   Wt 153 lb 11.2 oz (69.7 kg)   SpO2 99%   BMI 28.11 kg/m   Wt Readings from Last 3 Encounters:  05/08/18 153 lb 11.2 oz (69.7 kg)  03/06/18 161 lb 14.4 oz (73.4 kg)  02/05/18 166 lb (75.3 kg)    Physical Exam  Constitutional: She appears well-developed and well-nourished.  HENT:  Head: Normocephalic and atraumatic.  Eyes: Conjunctivae and EOM are normal. Right eye exhibits no hordeolum. Left eye exhibits no hordeolum. No scleral icterus.  Neck: Carotid bruit is not present. No thyromegaly present.  Cardiovascular: Normal rate, regular rhythm, S1 normal, S2 normal and normal heart sounds.  No extrasystoles are present.  Pulmonary/Chest: Effort normal and breath sounds normal. No respiratory distress. Right breast exhibits no inverted nipple, no mass, no nipple discharge, no skin change and no tenderness. Left breast exhibits no inverted nipple, no mass, no nipple discharge, no skin change and no tenderness. Breasts are symmetrical.  Occasional cough, few coarse breath sounds  Abdominal: Soft. Normal appearance and bowel sounds are normal. She exhibits no distension, no abdominal bruit, no pulsatile midline mass and no mass. There is no hepatosplenomegaly. There is no tenderness. No hernia.  Genitourinary: Uterus normal. Pelvic exam was performed with patient prone. There is no rash or lesion on the right labia. There  is no rash or lesion on the left labia. Cervix exhibits no motion tenderness. Right adnexum displays no mass, no tenderness and no fullness. Left adnexum displays no mass, no tenderness and no fullness.  Musculoskeletal: Normal range of motion. She exhibits no edema.  Lymphadenopathy:       Head (right side): No submandibular adenopathy present.       Head (left side): No submandibular adenopathy present.    She has no cervical adenopathy.    She has no axillary adenopathy.  Neurological: She is alert. She displays no tremor. No cranial nerve deficit. She exhibits normal muscle tone. Gait normal.  Skin: Skin is warm and dry. No bruising and no ecchymosis noted. No cyanosis. No pallor.  Psychiatric: Her speech is normal and behavior is normal. Thought content normal. Her mood appears not anxious. She does not exhibit a depressed mood.        Assessment & Plan:   Problem List Items Addressed This Visit      Other   Preventative health care - Primary    USPSTF grade A and B recommendations reviewed with patient; age-appropriate recommendations, preventive care, screening tests, etc discussed and encouraged; healthy living encouraged; see AVS for patient education given to patient       History of tobacco use, presenting hazards to health    Chest CT in 2020, managed through Dr. Genevive Bi       Other Visit Diagnoses    Cervical cancer screening       Relevant Orders   Cytology - PAP (Completed)  Follow up plan: Return in about 1 year (around 05/09/2019) for complete physical.  An after-visit summary was printed and given to the patient at [facility].  Please see the patient instructions which may contain other information and recommendations beyond what is mentioned above in the assessment and plan.  Meds ordered this encounter  Medications  . azithromycin (ZITHROMAX) 250 MG tablet    Sig: Two by mouth on day one, then one daily for four more days    Dispense:  6 tablet     Refill:  0    No orders of the defined types were placed in this encounter.

## 2018-05-08 NOTE — Patient Instructions (Addendum)
Try Duofilm and apply daily to rough bumps for about a month or until gone  Health Maintenance, Female Adopting a healthy lifestyle and getting preventive care can go a long way to promote health and wellness. Talk with your health care provider about what schedule of regular examinations is right for you. This is a good chance for you to check in with your provider about disease prevention and staying healthy. In between checkups, there are plenty of things you can do on your own. Experts have done a lot of research about which lifestyle changes and preventive measures are most likely to keep you healthy. Ask your health care provider for more information. Weight and diet Eat a healthy diet  Be sure to include plenty of vegetables, fruits, low-fat dairy products, and lean protein.  Do not eat a lot of foods high in solid fats, added sugars, or salt.  Get regular exercise. This is one of the most important things you can do for your health. ? Most adults should exercise for at least 150 minutes each week. The exercise should increase your heart rate and make you sweat (moderate-intensity exercise). ? Most adults should also do strengthening exercises at least twice a week. This is in addition to the moderate-intensity exercise.  Maintain a healthy weight  Body mass index (BMI) is a measurement that can be used to identify possible weight problems. It estimates body fat based on height and weight. Your health care provider can help determine your BMI and help you achieve or maintain a healthy weight.  For females 47 years of age and older: ? A BMI below 18.5 is considered underweight. ? A BMI of 18.5 to 24.9 is normal. ? A BMI of 25 to 29.9 is considered overweight. ? A BMI of 30 and above is considered obese.  Watch levels of cholesterol and blood lipids  You should start having your blood tested for lipids and cholesterol at 58 years of age, then have this test every 5 years.  You may  need to have your cholesterol levels checked more often if: ? Your lipid or cholesterol levels are high. ? You are older than 58 years of age. ? You are at high risk for heart disease.  Cancer screening Lung Cancer  Lung cancer screening is recommended for adults 60-43 years old who are at high risk for lung cancer because of a history of smoking.  A yearly low-dose CT scan of the lungs is recommended for people who: ? Currently smoke. ? Have quit within the past 15 years. ? Have at least a 30-pack-year history of smoking. A pack year is smoking an average of one pack of cigarettes a day for 1 year.  Yearly screening should continue until it has been 15 years since you quit.  Yearly screening should stop if you develop a health problem that would prevent you from having lung cancer treatment.  Breast Cancer  Practice breast self-awareness. This means understanding how your breasts normally appear and feel.  It also means doing regular breast self-exams. Let your health care provider know about any changes, no matter how small.  If you are in your 20s or 30s, you should have a clinical breast exam (CBE) by a health care provider every 1-3 years as part of a regular health exam.  If you are 48 or older, have a CBE every year. Also consider having a breast X-ray (mammogram) every year.  If you have a family history of breast cancer,  talk to your health care provider about genetic screening.  If you are at high risk for breast cancer, talk to your health care provider about having an MRI and a mammogram every year.  Breast cancer gene (BRCA) assessment is recommended for women who have family members with BRCA-related cancers. BRCA-related cancers include: ? Breast. ? Ovarian. ? Tubal. ? Peritoneal cancers.  Results of the assessment will determine the need for genetic counseling and BRCA1 and BRCA2 testing.  Cervical Cancer Your health care provider may recommend that you be  screened regularly for cancer of the pelvic organs (ovaries, uterus, and vagina). This screening involves a pelvic examination, including checking for microscopic changes to the surface of your cervix (Pap test). You may be encouraged to have this screening done every 3 years, beginning at age 21.  For women ages 29-65, health care providers may recommend pelvic exams and Pap testing every 3 years, or they may recommend the Pap and pelvic exam, combined with testing for human papilloma virus (HPV), every 5 years. Some types of HPV increase your risk of cervical cancer. Testing for HPV may also be done on women of any age with unclear Pap test results.  Other health care providers may not recommend any screening for nonpregnant women who are considered low risk for pelvic cancer and who do not have symptoms. Ask your health care provider if a screening pelvic exam is right for you.  If you have had past treatment for cervical cancer or a condition that could lead to cancer, you need Pap tests and screening for cancer for at least 20 years after your treatment. If Pap tests have been discontinued, your risk factors (such as having a new sexual partner) need to be reassessed to determine if screening should resume. Some women have medical problems that increase the chance of getting cervical cancer. In these cases, your health care provider may recommend more frequent screening and Pap tests.  Colorectal Cancer  This type of cancer can be detected and often prevented.  Routine colorectal cancer screening usually begins at 58 years of age and continues through 58 years of age.  Your health care provider may recommend screening at an earlier age if you have risk factors for colon cancer.  Your health care provider may also recommend using home test kits to check for hidden blood in the stool.  A small camera at the end of a tube can be used to examine your colon directly (sigmoidoscopy or colonoscopy).  This is done to check for the earliest forms of colorectal cancer.  Routine screening usually begins at age 92.  Direct examination of the colon should be repeated every 5-10 years through 58 years of age. However, you may need to be screened more often if early forms of precancerous polyps or small growths are found.  Skin Cancer  Check your skin from head to toe regularly.  Tell your health care provider about any new moles or changes in moles, especially if there is a change in a mole's shape or color.  Also tell your health care provider if you have a mole that is larger than the size of a pencil eraser.  Always use sunscreen. Apply sunscreen liberally and repeatedly throughout the day.  Protect yourself by wearing long sleeves, pants, a wide-brimmed hat, and sunglasses whenever you are outside.  Heart disease, diabetes, and high blood pressure  High blood pressure causes heart disease and increases the risk of stroke. High blood pressure is  more likely to develop in: ? People who have blood pressure in the high end of the normal range (130-139/85-89 mm Hg). ? People who are overweight or obese. ? People who are African American.  If you are 47-80 years of age, have your blood pressure checked every 3-5 years. If you are 94 years of age or older, have your blood pressure checked every year. You should have your blood pressure measured twice-once when you are at a hospital or clinic, and once when you are not at a hospital or clinic. Record the average of the two measurements. To check your blood pressure when you are not at a hospital or clinic, you can use: ? An automated blood pressure machine at a pharmacy. ? A home blood pressure monitor.  If you are between 34 years and 10 years old, ask your health care provider if you should take aspirin to prevent strokes.  Have regular diabetes screenings. This involves taking a blood sample to check your fasting blood sugar level. ? If  you are at a normal weight and have a low risk for diabetes, have this test once every three years after 58 years of age. ? If you are overweight and have a high risk for diabetes, consider being tested at a younger age or more often. Preventing infection Hepatitis B  If you have a higher risk for hepatitis B, you should be screened for this virus. You are considered at high risk for hepatitis B if: ? You were born in a country where hepatitis B is common. Ask your health care provider which countries are considered high risk. ? Your parents were born in a high-risk country, and you have not been immunized against hepatitis B (hepatitis B vaccine). ? You have HIV or AIDS. ? You use needles to inject street drugs. ? You live with someone who has hepatitis B. ? You have had sex with someone who has hepatitis B. ? You get hemodialysis treatment. ? You take certain medicines for conditions, including cancer, organ transplantation, and autoimmune conditions.  Hepatitis C  Blood testing is recommended for: ? Everyone born from [birth year] through 1966. ? Anyone with known risk factors for hepatitis C.  Sexually transmitted infections (STIs)  You should be screened for sexually transmitted infections (STIs) including gonorrhea and chlamydia if: ? You are sexually active and are younger than 58 years of age. ? You are older than 59 years of age and your health care provider tells you that you are at risk for this type of infection. ? Your sexual activity has changed since you were last screened and you are at an increased risk for chlamydia or gonorrhea. Ask your health care provider if you are at risk.  If you do not have HIV, but are at risk, it may be recommended that you take a prescription medicine daily to prevent HIV infection. This is called pre-exposure prophylaxis (PrEP). You are considered at risk if: ? You are sexually active and do not regularly use condoms or know the HIV status of your  partner(s). ? You take drugs by injection. ? You are sexually active with a partner who has HIV.  Talk with your health care provider about whether you are at high risk of being infected with HIV. If you choose to begin PrEP, you should first be tested for HIV. You should then be tested every 3 months for as long as you are taking PrEP. Pregnancy  If you are premenopausal and you  may become pregnant, ask your health care provider about preconception counseling.  If you may become pregnant, take 400 to 800 micrograms (mcg) of folic acid every day.  If you want to prevent pregnancy, talk to your health care provider about birth control (contraception). Osteoporosis and menopause  Osteoporosis is a disease in which the bones lose minerals and strength with aging. This can result in serious bone fractures. Your risk for osteoporosis can be identified using a bone density scan.  If you are 59 years of age or older, or if you are at risk for osteoporosis and fractures, ask your health care provider if you should be screened.  Ask your health care provider whether you should take a calcium or vitamin D supplement to lower your risk for osteoporosis.  Menopause may have certain physical symptoms and risks.  Hormone replacement therapy may reduce some of these symptoms and risks. Talk to your health care provider about whether hormone replacement therapy is right for you. Follow these instructions at home:  Schedule regular health, dental, and eye exams.  Stay current with your immunizations.  Do not use any tobacco products including cigarettes, chewing tobacco, or electronic cigarettes.  If you are pregnant, do not drink alcohol.  If you are breastfeeding, limit how much and how often you drink alcohol.  Limit alcohol intake to no more than 1 drink per day for nonpregnant women. One drink equals 12 ounces of beer, 5 ounces of wine, or 1 ounces of hard liquor.  Do not use street  drugs.  Do not share needles.  Ask your health care provider for help if you need support or information about quitting drugs.  Tell your health care provider if you often feel depressed.  Tell your health care provider if you have ever been abused or do not feel safe at home. This information is not intended to replace advice given to you by your health care provider. Make sure you discuss any questions you have with your health care provider. Document Released: 04/01/2011 Document Revised: 02/22/2016 Document Reviewed: 06/20/2015 Elsevier Interactive Patient Education  [employer].

## 2018-05-11 LAB — CYTOLOGY - PAP
DIAGNOSIS: NEGATIVE
HPV (WINDOPATH): NOT DETECTED

## 2018-05-15 DIAGNOSIS — Z0000 Encounter for general adult medical examination without abnormal findings: Secondary | ICD-10-CM | POA: Insufficient documentation

## 2018-05-15 NOTE — Assessment & Plan Note (Signed)
Chest CT in 2020, managed through Dr. Genevive Bi

## 2018-05-15 NOTE — Assessment & Plan Note (Signed)
USPSTF grade A and B recommendations reviewed with patient; age-appropriate recommendations, preventive care, screening tests, etc discussed and encouraged; healthy living encouraged; see AVS for patient education given to patient  

## 2018-05-18 ENCOUNTER — Other Ambulatory Visit: Payer: Self-pay | Admitting: Family Medicine

## 2018-05-18 NOTE — Telephone Encounter (Signed)
Last Cr reviewed; Rx approved 

## 2018-05-22 ENCOUNTER — Ambulatory Visit
Admission: RE | Admit: 2018-05-22 | Discharge: 2018-05-22 | Disposition: A | Discharge status: Home / Self Care | Payer: BLUE CROSS/BLUE SHIELD | Source: Ambulatory Visit | Attending: Family Medicine | Admitting: Family Medicine

## 2018-05-22 DIAGNOSIS — Z1231 Encounter for screening mammogram for malignant neoplasm of breast: Secondary | ICD-10-CM | POA: Insufficient documentation

## 2018-05-22 DIAGNOSIS — Z1239 Encounter for other screening for malignant neoplasm of breast: Secondary | ICD-10-CM

## 2018-05-26 ENCOUNTER — Encounter: Payer: Self-pay | Admitting: Family Medicine

## 2018-08-04 ENCOUNTER — Encounter: Payer: Self-pay | Admitting: Family Medicine

## 2018-08-05 MED ORDER — IBUPROFEN 600 MG PO TABS: 600.0000 mg | ORAL_TABLET | Freq: Three times a day (TID) | ORAL | 0 refills | Status: DC | PRN

## 2018-08-07 ENCOUNTER — Other Ambulatory Visit: Payer: Self-pay | Admitting: Nurse Practitioner

## 2018-08-08 ENCOUNTER — Other Ambulatory Visit: Payer: Self-pay | Admitting: Nurse Practitioner

## 2018-08-08 DIAGNOSIS — Z87891 Personal history of nicotine dependence: Secondary | ICD-10-CM

## 2018-08-08 DIAGNOSIS — F418 Other specified anxiety disorders: Secondary | ICD-10-CM

## 2018-08-17 ENCOUNTER — Encounter: Payer: Self-pay | Admitting: Family Medicine

## 2018-08-18 MED ORDER — NYSTATIN 100000 UNIT/GM EX POWD: Freq: Three times a day (TID) | CUTANEOUS | 2 refills | Status: DC | PRN

## 2018-11-02 ENCOUNTER — Telehealth: Payer: Self-pay | Admitting: Family Medicine

## 2018-11-02 NOTE — Telephone Encounter (Signed)
Left message informing pt that prescription has been sent to the pharmacy also informed pt to return call to schedule appt.

## 2018-11-02 NOTE — Telephone Encounter (Signed)
Last regular visit was June 2019; please schedule for a visit soon I'll send limited Rx

## 2018-11-05 ENCOUNTER — Other Ambulatory Visit: Payer: Self-pay | Admitting: Nurse Practitioner

## 2018-11-06 NOTE — Telephone Encounter (Signed)
Please contact pharmacy and get a fill hx of SABA, rescue inhaler She cannot have gone through 18 inhalers since November Thank you

## 2018-11-09 NOTE — Telephone Encounter (Signed)
Called pharmacy, they state only 1 inhaler given, she states quanity of 9 is 1 inhaler.

## 2018-11-15 ENCOUNTER — Other Ambulatory Visit: Payer: Self-pay | Admitting: Family Medicine

## 2018-11-15 ENCOUNTER — Other Ambulatory Visit: Payer: Self-pay | Admitting: Nurse Practitioner

## 2018-11-15 DIAGNOSIS — E785 Hyperlipidemia, unspecified: Secondary | ICD-10-CM

## 2018-11-16 ENCOUNTER — Other Ambulatory Visit: Payer: Self-pay

## 2018-11-16 DIAGNOSIS — R7303 Prediabetes: Secondary | ICD-10-CM

## 2018-11-16 MED ORDER — METFORMIN HCL 1000 MG PO TABS: 1000.0000 mg | ORAL_TABLET | Freq: Two times a day (BID) | ORAL | 0 refills | Status: DC

## 2018-11-16 MED ORDER — LISINOPRIL 5 MG PO TABS: 5.0000 mg | ORAL_TABLET | Freq: Every day | ORAL | 0 refills | Status: DC

## 2018-11-16 NOTE — Telephone Encounter (Signed)
Refill request was sent to Elizabeth Poulose, DNP for approval and submission.  

## 2018-11-16 NOTE — Telephone Encounter (Signed)
Please schedule patient for an appointment  Lab Results  Component Value Date   CREATININE 0.89 12/05/2017   Lab Results  Component Value Date   ALT 19 12/05/2017   ALT 19 11/25/2011    She will need a visit and lab

## 2018-11-30 ENCOUNTER — Encounter: Payer: Self-pay | Admitting: Family Medicine

## 2018-11-30 ENCOUNTER — Ambulatory Visit: Payer: BLUE CROSS/BLUE SHIELD | Admitting: Family Medicine

## 2018-11-30 DIAGNOSIS — K760 Fatty (change of) liver, not elsewhere classified: Secondary | ICD-10-CM

## 2018-11-30 DIAGNOSIS — Z5181 Encounter for therapeutic drug level monitoring: Secondary | ICD-10-CM

## 2018-11-30 DIAGNOSIS — I10 Essential (primary) hypertension: Secondary | ICD-10-CM | POA: Diagnosis not present

## 2018-11-30 DIAGNOSIS — J449 Chronic obstructive pulmonary disease, unspecified: Secondary | ICD-10-CM | POA: Diagnosis not present

## 2018-11-30 DIAGNOSIS — R7989 Other specified abnormal findings of blood chemistry: Secondary | ICD-10-CM

## 2018-11-30 DIAGNOSIS — E782 Mixed hyperlipidemia: Secondary | ICD-10-CM

## 2018-11-30 DIAGNOSIS — R7303 Prediabetes: Secondary | ICD-10-CM

## 2018-11-30 DIAGNOSIS — E781 Pure hyperglyceridemia: Secondary | ICD-10-CM

## 2018-11-30 DIAGNOSIS — I700 Atherosclerosis of aorta: Secondary | ICD-10-CM

## 2018-11-30 DIAGNOSIS — Z720 Tobacco use: Secondary | ICD-10-CM

## 2018-11-30 NOTE — Assessment & Plan Note (Signed)
Check lipids on another; do come fasting; limit fried foods and sugary and starchy

## 2018-11-30 NOTE — Assessment & Plan Note (Signed)
Encouraged weight loss; limit fried foods, stay on the vascepa; check liver enzymes

## 2018-11-30 NOTE — Assessment & Plan Note (Signed)
Controlled today; continue medicine; DASH guidelines

## 2018-11-30 NOTE — Progress Notes (Signed)
BP 128/70   Pulse 93   Temp 98.4 F (36.9 C)   Resp 16   Ht 5\' 2"  (1.575 m)   Wt 152 lb 9.6 oz (69.2 kg)   SpO2 93%   BMI 27.91 kg/m    Subjective:    Patient ID: Savannah Washington, female    DOB: 1960/10/13, 58 y.o.   MRN: [id number]  HPI: Savannah Washington is a 58 y.o. female  Chief Complaint  Patient presents with  . Follow-up  . Ear Pain    ear discomfort, feels clogged, onset 3 days ago    HPI Here for f/u No travel or exposure to someone from [place], [place], [place], [place], or [place] She cannot afford labs today she says  Her left ear is popping, like in the mountains; no drainage No fevers; no real pain, just aggravating Little sinus stuff going on; no sore throat; no nasal sprays  Still has pain in the left shoulder; had the injections but 2nd one was scary she says, went through the front  Prediabetes; last A1c 6.2; some dry mouth; no blurred vision after eating; frequent urination; no sugary drinks; nothing but water and black coffee  High TG; last was 430, down from 618 Prescribed atorvastatin and Vascepa Fried foods -- "it's my weakness" Gave up pork chops, sausage, none in years; gave that up completely, that was her favorite Eating some bacon, once a week Does more grilling; loves fried chicken, does chicken bites and deep fries them Not much cheese; not many eggs  Overweight; lost weight, taking stairs instead of elevator; staying hydrated  Started back to smoking; about 1/2 ppd, started 3 months ago; knows she needs to quit; had more energy when she wasn't smoking  Anxiety; on two medicines for anxiety; raising 58 year old; challenge every day; he is in counseling already through [facility]osp[facility]  Depression screen [facility] 2/9 11/30/2018 05/08/2018 05/08/2018 03/06/2018 01/12/2018  Decreased Interest 0 0 0 0 0  Down, Depressed, Hopeless 0 0 0 1 0  PHQ - 2 Score 0 0 0 1 0  Altered sleeping 0 0 - 0 -  Tired, decreased energy 0 0 - 0 -  Change in appetite 0 0 - 0 -    Feeling bad or failure about yourself  0 0 - 0 -  Trouble concentrating 0 0 - 1 -  Moving slowly or fidgety/restless 0 0 - 0 -  Suicidal thoughts 0 0 - 0 -  PHQ-9 Score 0 0 - 2 -  Difficult doing work/chores Not difficult at all - - Not difficult at all -  MD note: patient is not depressed  Fall Risk  11/30/2018 05/08/2018 01/12/2018 12/05/2017 09/03/2017  Falls in the past year? 0 No No No No    Relevant past medical, surgical, family and social history reviewed Past Medical History:  Diagnosis Date  . Anxiety   . Aortic atherosclerosis ([facility]) 12/29/2017   Chest CT March 2019  . Cervical arthritis 09/04/2017   xrays Dec 2018  . Degenerative disc disease, cervical 09/04/2017   Xrays Dec 2018  . Depression   . Emphysema lung ([facility]) 12/29/2017   Chest CT March 2019  . Hepatic steatosis 12/29/2017   Chest CT March 2019  . Hyperlipidemia   . Hypertension   . Prediabetes 03/14/2017   History reviewed. No pertinent surgical history. Family History  Problem Relation Age of Onset  . Dementia Mother   . Breast cancer Sister 43  .  Aneurysm Father   . Heart attack Brother   . Heart defect Brother   . Brain cancer Daughter   . Heart attack Paternal Grandfather   . Drug abuse Sister    Social History   Tobacco Use  . Smoking status: Current Every Day Smoker    Packs/day: 0.50    Years: 44.00    Pack years: 22.00    Types: Cigarettes    Start date: 08/30/2018  . Smokeless tobacco: Never Used  Substance Use Topics  . Alcohol use: Yes    Alcohol/week: 5.0 standard drinks    Types: 1 Glasses of wine, 4 Cans of beer per week  . Drug use: No     Office Visit from 11/30/2018 in [facility]  AUDIT-C Score  1      Interim medical history since last visit reviewed. Allergies and medications reviewed  Review of Systems Per HPI unless specifically indicated above     Objective:    BP 128/70   Pulse 93   Temp 98.4 F (36.9 C)   Resp 16   Ht 5\' 2"  (1.575 m)   Wt  152 lb 9.6 oz (69.2 kg)   SpO2 93%   BMI 27.91 kg/m   Wt Readings from Last 3 Encounters:  11/30/18 152 lb 9.6 oz (69.2 kg)  05/08/18 153 lb 11.2 oz (69.7 kg)  03/06/18 161 lb 14.4 oz (73.4 kg)    Physical Exam Constitutional:      General: She is not in acute distress.    Appearance: She is well-developed. She is not diaphoretic.  HENT:     Head: Normocephalic and atraumatic.  Eyes:     General: No scleral icterus. Neck:     Thyroid: No thyromegaly.  Cardiovascular:     Rate and Rhythm: Normal rate and regular rhythm.     Heart sounds: Normal heart sounds. No murmur.  Pulmonary:     Effort: Pulmonary effort is normal. No respiratory distress.     Breath sounds: Normal breath sounds. No wheezing.  Abdominal:     General: Bowel sounds are normal. There is no distension.     Palpations: Abdomen is soft.  Skin:    General: Skin is warm and dry.     Coloration: Skin is not pale.  Neurological:     Mental Status: She is alert.  Psychiatric:        Mood and Affect: Mood normal.        Behavior: Behavior normal.        Thought Content: Thought content normal.        Judgment: Judgment normal.       Assessment & Plan:   Problem List Items Addressed This Visit      Cardiovascular and Mediastinum   Essential hypertension, benign (Chronic)    Controlled today; continue medicine; DASH guidelines      Aortic atherosclerosis (H[facility]) (Chronic)    Noted on previous imaging; goal LDL less than 70; smoking cessation would be beneficial      Relevant Orders   Lipid panel     Respiratory   COPD (chronic obstructive pulmonary disease) (HCC) (Chronic)    Urged her to quit smoking; see AVS        Digestive   Hepatic steatosis (Chronic)    Encouraged weight loss; limit fried foods, stay on the vascepa; check liver enzymes      Relevant Orders   COMPLETE METABOLIC PANEL WITH GFR  Other   Prediabetes    Noticed trend upward, will check fasting glucose and A1c in a few  weeks      Relevant Orders   Hemoglobin A1c   Hypertriglyceridemia    Check lipids on another; do come fasting; limit fried foods and sugary and starchy      Relevant Orders   Lipid panel   Tobacco abuse    Encouraged pt to quit smoking; I am here to help if/when needed      Medication monitoring encounter    Check labs      Relevant Orders   COMPLETE METABOLIC PANEL WITH GFR   Hyperlipidemia    Check lipids, patient will come back another day soon; healthy eating encouraged      Relevant Orders   Lipid panel   Elevated platelet count    Recheck when she returns for labs; will also check path review      Relevant Orders   CBC with Differential/Platelet   Pathologist smear review       Follow up plan: Return in about 1 week (around 12/07/2018) for fasting labs only; 3.5 months with Dr. Sanda Klein.  An after-visit summary was printed and given to the patient at [place].  Please see the patient instructions which may contain other information and recommendations beyond what is mentioned above in the assessment and plan.  No orders of the defined types were placed in this encounter.   Orders Placed This Encounter  Procedures  . CBC with Differential/Platelet  . COMPLETE METABOLIC PANEL WITH GFR  . Hemoglobin A1c  . Lipid panel  . Pathologist smear review

## 2018-11-30 NOTE — Assessment & Plan Note (Signed)
Urged her to quit smoking; see AVS

## 2018-11-30 NOTE — Patient Instructions (Addendum)
Please return in 1-2 weeks for FASTING labs  I do encourage you to quit smoking Call [phone] to sign up for smoking cessation classes You can call 1-800-QUIT-NOW to talk with a smoking cessation coach Please do not vape  Check out the information at familydoctor.org entitled "Nutrition for Weight Loss: What You Need to Know about Fad Diets" Try to lose between 1-2 pounds per week by taking in fewer calories and burning off more calories You can succeed by limiting portions, limiting foods dense in calories and fat, becoming more active, and drinking 8 glasses of water a day (64 ounces) Don't skip meals, especially breakfast, as skipping meals may alter your metabolism Do not use over-the-counter weight loss pills or gimmicks that claim rapid weight loss A healthy BMI (or body mass index) is between 18.5 and 24.9 You can calculate your ideal BMI at the NIH website ClubMonetize.fr  Try to limit saturated fats in your diet (bologna, hot dogs, barbeque, cheeseburgers, hamburgers, steak, bacon, sausage, cheese, etc.) and get more fresh fruits, vegetables, and whole grains  You can ask your pharmacist for the generic [place] manufactured albuterol HFA inhaler when it becomes available   High Triglycerides Eating Plan Triglycerides are a type of fat in the blood. High levels of triglycerides can increase your risk of heart disease and stroke. If your triglyceride levels are high, choosing the right foods can help lower your triglycerides and keep your heart healthy. Work with your health care provider or a diet and nutrition specialist (dietitian) to develop an eating plan that is right for you. What are tips for following this plan? General guidelines   Lose weight, if you are overweight. For most people, losing 5-10 lbs (2-5 kg) helps lower triglyceride levels. A weight-loss plan may include. ? 30 minutes of exercise at least 5 days a  week. ? Reducing the amount of calories, sugar, and fat you eat.  Eat a wide variety of fresh fruits, vegetables, and whole grains. These foods are high in fiber.  Eat foods that contain healthy fats, such as fatty fish, nuts, seeds, and olive oil.  Avoid foods that are high in added sugar, added salt (sodium), saturated fat, and trans fat.  Avoid low-fiber, refined carbohydrates such as white bread, crackers, noodles, and white rice.  Avoid foods with partially hydrogenated oils (trans fats), such as fried foods or stick margarine.  Limit alcohol intake to no more than 1 drink a day for nonpregnant women and 2 drinks a day for men. One drink equals 12 oz of beer, 5 oz of wine, or 1 oz of hard liquor. Your health care provider may recommend that you drink less depending on your overall health. Reading food labels  Check food labels for the amount of saturated fat. Choose foods with no or very little saturated fat.  Check food labels for the amount of trans fat. Choose foods with no trans fat.  Check food labels for the amount of cholesterol. Choose foods low in cholesterol. Ask your dietitian how much cholesterol you should have each day.  Check food labels for the amount of sodium. Choose foods with less than 140 milligrams (mg) per serving. Shopping  Buy dairy products labeled as nonfat (skim) or low-fat (1%).  Avoid buying processed or prepackaged foods. These are often high in added sugar, sodium, and fat. Cooking  Choose healthy fats when cooking, such as olive oil or canola oil.  Cook foods using lower fat methods, such as baking, broiling, boiling,  or grilling.  Make your own sauces, dressings, and marinades when possible, instead of buying them. Store-bought sauces, dressings, and marinades are often high in sodium and sugar. Meal planning  Eat more home-cooked food and less restaurant, buffet, and fast food.  Eat fatty fish at least 2 times each week. Examples of  fatty fish include salmon, trout, mackerel, tuna, and herring.  If you eat whole eggs, do not eat more than 3 egg yolks per week. What foods are recommended? The items listed may not be a complete list. Talk with your dietitian about what dietary choices are best for you. Grains Whole wheat or whole grain breads, crackers, cereals, and pasta. Unsweetened oatmeal. Bulgur. Barley. Quinoa. Brown rice. Whole wheat flour tortillas. Vegetables Fresh or frozen vegetables. Low-sodium canned vegetables. Fruits All fresh, canned (in natural juice), or frozen fruits. Meats and other protein foods Skinless chicken or [place]. Ground chicken or [place]. Lean cuts of pork, trimmed of fat. Fish and seafood, especially salmon, trout, and herring. Egg whites. Dried beans, peas, or lentils. Unsalted nuts or seeds. Unsalted canned beans. Natural peanut or almond butter. Dairy Low-fat dairy products. Skim or low-fat (1%) milk. Reduced fat (2%) and low-sodium cheese. Low-fat ricotta cheese. Low-fat cottage cheese. Plain, low-fat yogurt. Fats and oils Tub margarine without trans fats. Light or reduced-fat mayonnaise. Light or reduced-fat salad dressings. Avocado. Safflower, olive, sunflower, soybean, and canola oils. What foods are not recommended? The items listed may not be a complete list. Talk with your dietitian about what dietary choices are best for you. Grains White bread. White (regular) pasta. White rice. Cornbread. Bagels. Pastries. Crackers that contain trans fat. Vegetables Creamed or fried vegetables. Vegetables in a cheese sauce. Fruits Sweetened dried fruit. Canned fruit in syrup. Fruit juice. Meats and other protein foods Fatty cuts of meat. Ribs. Chicken wings. Berniece Salines. Sausage. Bologna. Salami. Chitterlings. Fatback. Hot dogs. Bratwurst. Packaged lunch meats. Dairy Whole or reduced-fat (2%) milk. Half-and-half. Cream cheese. Full-fat or sweetened yogurt. Full-fat cheese. Nondairy creamers.  Whipped toppings. Processed cheese or cheese spreads. Cheese curds. Beverages Alcohol. Sweetened drinks, such as soda, lemonade, fruit drinks, or punches. Fats and oils Butter. Stick margarine. Lard. Shortening. Ghee. Bacon fat. Tropical oils, such as coconut, palm kernel, or palm oils. Sweets and desserts Corn syrup. Sugars. Honey. Molasses. Candy. Jam and jelly. Syrup. Sweetened cereals. Cookies. Pies. Cakes. Donuts. Muffins. Ice cream. Condiments Store-bought sauces, dressings, and marinades that are high in sugar, such as ketchup and barbecue sauce. Summary  High levels of triglycerides can increase the risk of heart disease and stroke. Choosing the right foods can help lower your triglycerides.  Eat plenty of fresh fruits, vegetables, and whole grains. Choose low-fat dairy and lean meats. Eat fatty fish at least twice a week.  Avoid processed and prepackaged foods with added sugar, sodium, saturated fat, and trans fat.  If you need suggestions or have questions about what types of food are good for you, talk with your health care provider or a dietitian. This information is not intended to replace advice given to you by your health care provider. Make sure you discuss any questions you have with your health care provider. Document Released: 07/04/2004 Document Revised: 11/19/2016 Document Reviewed: 11/19/2016 Elsevier Interactive Patient Education  2019 [employer].  Steps to Quit Smoking  Smoking tobacco can be bad for your health. It can also affect almost every organ in your body. Smoking puts you and people around you at risk for many serious long-lasting (chronic) diseases.  Quitting smoking is hard, but it is one of the best things that you can do for your health. It is never too late to quit. What are the benefits of quitting smoking? When you quit smoking, you lower your risk for getting serious diseases and conditions. They can include:  Lung cancer or lung  disease.  Heart disease.  Stroke.  Heart attack.  Not being able to have children (infertility).  Weak bones (osteoporosis) and broken bones (fractures). If you have coughing, wheezing, and shortness of breath, those symptoms may get better when you quit. You may also get sick less often. If you are pregnant, quitting smoking can help to lower your chances of having a baby of low birth weight. What can I do to help me quit smoking? Talk with your doctor about what can help you quit smoking. Some things you can do (strategies) include:  Quitting smoking totally, instead of slowly cutting back how much you smoke over a period of time.  Going to in-person counseling. You are more likely to quit if you go to many counseling sessions.  Using resources and support systems, such as: ? Database administrator with a Social worker. ? Phone quitlines. ? Careers information officer. ? Support groups or group counseling. ? Text messaging programs. ? Mobile phone apps or applications.  Taking medicines. Some of these medicines may have nicotine in them. If you are pregnant or breastfeeding, do not take any medicines to quit smoking unless your doctor says it is okay. Talk with your doctor about counseling or other things that can help you. Talk with your doctor about using more than one strategy at the same time, such as taking medicines while you are also going to in-person counseling. This can help make quitting easier. What things can I do to make it easier to quit? Quitting smoking might feel very hard at first, but there is a lot that you can do to make it easier. Take these steps:  Talk to your family and friends. Ask them to support and encourage you.  Call phone quitlines, reach out to support groups, or work with a Social worker.  Ask people who smoke to not smoke around you.  Avoid places that make you want (trigger) to smoke, such as: ? Bars. ? Parties. ? Smoke-break areas at work.  Spend time  with people who do not smoke.  Lower the stress in your life. Stress can make you want to smoke. Try these things to help your stress: ? Getting regular exercise. ? Deep-breathing exercises. ? Yoga. ? Meditating. ? Doing a body scan. To do this, close your eyes, focus on one area of your body at a time from head to toe, and notice which parts of your body are tense. Try to relax the muscles in those areas.  Download or buy apps on your mobile phone or tablet that can help you stick to your quit plan. There are many free apps, such as QuitGuide from the [employer] Office manager for Disease Control and Prevention). You can find more support from smokefree.gov and other websites. This information is not intended to replace advice given to you by your health care provider. Make sure you discuss any questions you have with your health care provider. Document Released: 07/13/2009 Document Revised: 05/14/2016 Document Reviewed: 01/31/2015 Elsevier Interactive Patient Education  2019 [employer].

## 2018-11-30 NOTE — Assessment & Plan Note (Signed)
Noticed trend upward, will check fasting glucose and A1c in a few weeks

## 2018-12-01 ENCOUNTER — Encounter: Payer: Self-pay | Admitting: Family Medicine

## 2018-12-01 DIAGNOSIS — F17200 Nicotine dependence, unspecified, uncomplicated: Secondary | ICD-10-CM | POA: Insufficient documentation

## 2018-12-01 DIAGNOSIS — Z720 Tobacco use: Secondary | ICD-10-CM | POA: Insufficient documentation

## 2018-12-01 NOTE — Assessment & Plan Note (Signed)
Check labs 

## 2018-12-01 NOTE — Assessment & Plan Note (Signed)
Recheck when she returns for labs; will also check path review

## 2018-12-01 NOTE — Assessment & Plan Note (Signed)
Noted on previous imaging; goal LDL less than 70; smoking cessation would be beneficial

## 2018-12-01 NOTE — Assessment & Plan Note (Signed)
Encouraged pt to quit smoking; I am here to help if/when needed

## 2018-12-01 NOTE — Assessment & Plan Note (Signed)
Check lipids, patient will come back another day soon; healthy eating encouraged

## 2018-12-02 ENCOUNTER — Encounter: Payer: Self-pay | Admitting: *Deleted

## 2018-12-06 ENCOUNTER — Encounter: Payer: Self-pay | Admitting: Family Medicine

## 2018-12-10 ENCOUNTER — Other Ambulatory Visit: Payer: Self-pay | Admitting: Nurse Practitioner

## 2018-12-10 DIAGNOSIS — E785 Hyperlipidemia, unspecified: Secondary | ICD-10-CM

## 2018-12-10 DIAGNOSIS — R7303 Prediabetes: Secondary | ICD-10-CM

## 2018-12-12 ENCOUNTER — Telehealth: Payer: Self-pay

## 2018-12-12 NOTE — Telephone Encounter (Signed)
Lab Results  Component Value Date   CREATININE 0.89 12/05/2017   Lab Results  Component Value Date   ALT 19 12/05/2017   ALT 19 11/25/2011   Lab Results  Component Value Date   HGBA1C 6.2 (H) 03/06/2018   Patient is way overdue for labs I ordered a full panel on March 3rd I need those results asap I'll provide limited Rx but ask her to come ASAP (FASTING) Thank you

## 2018-12-12 NOTE — Telephone Encounter (Signed)
Call pt regarding lung screening. PT is a current smoker, smoking about 1/2 pack per day. Pt would like scan in the afternoon on any day. Pt denies any new health issues.

## 2018-12-14 ENCOUNTER — Other Ambulatory Visit: Payer: Self-pay

## 2018-12-14 DIAGNOSIS — K760 Fatty (change of) liver, not elsewhere classified: Secondary | ICD-10-CM

## 2018-12-14 DIAGNOSIS — R7303 Prediabetes: Secondary | ICD-10-CM

## 2018-12-14 DIAGNOSIS — I700 Atherosclerosis of aorta: Secondary | ICD-10-CM

## 2018-12-14 DIAGNOSIS — E781 Pure hyperglyceridemia: Secondary | ICD-10-CM

## 2018-12-14 DIAGNOSIS — E782 Mixed hyperlipidemia: Secondary | ICD-10-CM

## 2018-12-14 DIAGNOSIS — R7989 Other specified abnormal findings of blood chemistry: Secondary | ICD-10-CM

## 2018-12-14 DIAGNOSIS — Z5181 Encounter for therapeutic drug level monitoring: Secondary | ICD-10-CM

## 2018-12-14 NOTE — Telephone Encounter (Signed)
Pt.notified

## 2018-12-15 ENCOUNTER — Encounter: Payer: Self-pay | Admitting: Family Medicine

## 2018-12-15 ENCOUNTER — Other Ambulatory Visit: Payer: Self-pay | Admitting: Family Medicine

## 2018-12-15 DIAGNOSIS — E785 Hyperlipidemia, unspecified: Secondary | ICD-10-CM

## 2018-12-15 DIAGNOSIS — E781 Pure hyperglyceridemia: Secondary | ICD-10-CM

## 2018-12-15 DIAGNOSIS — E782 Mixed hyperlipidemia: Secondary | ICD-10-CM

## 2018-12-15 DIAGNOSIS — Z5181 Encounter for therapeutic drug level monitoring: Secondary | ICD-10-CM

## 2018-12-15 LAB — CBC WITH DIFFERENTIAL/PLATELET
Absolute Monocytes: 902 cells/uL (ref 200–950)
BASOS PCT: 0.9 %
Basophils Absolute: 87 cells/uL (ref 0–200)
Eosinophils Absolute: 369 cells/uL (ref 15–500)
Eosinophils Relative: 3.8 %
HCT: 41.2 % (ref 35.0–45.0)
Hemoglobin: 14.2 g/dL (ref 11.7–15.5)
Lymphs Abs: 3308 cells/uL (ref 850–3900)
MCH: 30.9 pg (ref 27.0–33.0)
MCHC: 34.5 g/dL (ref 32.0–36.0)
MCV: 89.8 fL (ref 80.0–100.0)
MPV: 9.1 fL (ref 7.5–12.5)
Monocytes Relative: 9.3 %
Neutro Abs: 5034 cells/uL (ref 1500–7800)
Neutrophils Relative %: 51.9 %
Platelets: 440 10*3/uL — ABNORMAL HIGH (ref 140–400)
RBC: 4.59 10*6/uL (ref 3.80–5.10)
RDW: 13 % (ref 11.0–15.0)
Total Lymphocyte: 34.1 %
WBC: 9.7 10*3/uL (ref 3.8–10.8)

## 2018-12-15 LAB — HEMOGLOBIN A1C
Hgb A1c MFr Bld: 6.2 % of total Hgb — ABNORMAL HIGH (ref <5.7)
Mean Plasma Glucose: 131 (calc)
eAG (mmol/L): 7.3 (calc)

## 2018-12-15 LAB — COMPLETE METABOLIC PANEL WITH GFR
AG Ratio: 1.8 (calc) (ref 1.0–2.5)
ALBUMIN MSPROF: 4.4 g/dL (ref 3.6–5.1)
ALT: 16 U/L (ref 6–29)
AST: 30 U/L (ref 10–35)
Alkaline phosphatase (APISO): 58 U/L (ref 37–153)
BUN: 10 mg/dL (ref 7–25)
CO2: 29 mmol/L (ref 20–32)
Calcium: 9.4 mg/dL (ref 8.6–10.4)
Chloride: 100 mmol/L (ref 98–110)
Creat: 0.84 mg/dL (ref 0.50–1.05)
GFR, Est African American: 89 mL/min/{1.73_m2} (ref >=60)
GFR, Est Non African American: 77 mL/min/{1.73_m2} (ref >=60)
GLUCOSE: 99 mg/dL (ref 65–99)
Globulin: 2.4 g/dL (calc) (ref 1.9–3.7)
Potassium: 4.3 mmol/L (ref 3.5–5.3)
Sodium: 138 mmol/L (ref 135–146)
Total Bilirubin: 0.4 mg/dL (ref 0.2–1.2)
Total Protein: 6.8 g/dL (ref 6.1–8.1)

## 2018-12-15 LAB — LIPID PANEL
Cholesterol: 169 mg/dL (ref <200)
HDL: 45 mg/dL — ABNORMAL LOW (ref >=50)
Non-HDL Cholesterol (Calc): 124 mg/dL (calc) (ref <130)
TRIGLYCERIDES: 500 mg/dL — AB (ref <150)
Total CHOL/HDL Ratio: 3.8 (calc) (ref <5.0)

## 2018-12-15 LAB — PATHOLOGIST SMEAR REVIEW

## 2018-12-15 MED ORDER — ATORVASTATIN CALCIUM 40 MG PO TABS: 40.0000 mg | ORAL_TABLET | Freq: Every day | ORAL | 1 refills | Status: DC

## 2018-12-15 MED ORDER — FENOFIBRATE 48 MG PO TABS: 48.0000 mg | ORAL_TABLET | Freq: Every day | ORAL | 1 refills | Status: DC

## 2018-12-15 MED ORDER — PRAVASTATIN SODIUM 40 MG PO TABS: 40.0000 mg | ORAL_TABLET | Freq: Every day | ORAL | 1 refills | Status: DC

## 2018-12-15 NOTE — Telephone Encounter (Signed)
I spoke personally with the patient We talked about having her see lipid specialist versus switching up meds Explained risk of rhabdomyolysis, reasons to stop BOTH new chol meds and seek help right away Stop the atorvastatin Start pravatatin Wait TWO weeks before starting the fenofibrate to let the atorvastatin get completley out of her system We'll recheck labs after 6 weeks

## 2018-12-16 ENCOUNTER — Telehealth: Payer: Self-pay | Admitting: *Deleted

## 2018-12-16 DIAGNOSIS — Z122 Encounter for screening for malignant neoplasm of respiratory organs: Secondary | ICD-10-CM

## 2018-12-16 NOTE — Telephone Encounter (Signed)
Patient has been notified that the annual lung cancer screening low dose CT scan is due currently or will be in the near future.  Confirmed that the patient is within the age range of 70-80, and asymptomatic, and currently exhibits no signs or symptoms of lung cancer.  Patient denies illness that would prevent curative treatment for lung cancer if found.  Verified smoking history, current smoker 0.5ppd with 44pkyr hx .  The shared decision making visit was completed on 12-24-18.  Patient is agreeable for the CT scan to be scheduled.  Will call patient back with date and time of appointment.

## 2018-12-17 ENCOUNTER — Other Ambulatory Visit: Payer: Self-pay | Admitting: Family Medicine

## 2018-12-17 ENCOUNTER — Telehealth: Payer: Self-pay | Admitting: *Deleted

## 2018-12-17 DIAGNOSIS — R7989 Other specified abnormal findings of blood chemistry: Secondary | ICD-10-CM

## 2018-12-17 NOTE — Telephone Encounter (Signed)
Patient notified/message left to notify patient that due to current restrictions lung screening appointments are cancelled and patient will be contacted regarding rescheduling.  

## 2018-12-23 ENCOUNTER — Encounter: Payer: Self-pay | Admitting: Family Medicine

## 2018-12-24 ENCOUNTER — Ambulatory Visit (INDEPENDENT_AMBULATORY_CARE_PROVIDER_SITE_OTHER): Payer: BLUE CROSS/BLUE SHIELD | Admitting: Family Medicine

## 2018-12-24 ENCOUNTER — Encounter: Payer: Self-pay | Admitting: Family Medicine

## 2018-12-24 DIAGNOSIS — M5416 Radiculopathy, lumbar region: Secondary | ICD-10-CM

## 2018-12-24 MED ORDER — IBUPROFEN 600 MG PO TABS: 600.0000 mg | ORAL_TABLET | Freq: Three times a day (TID) | ORAL | 0 refills | Status: DC

## 2018-12-24 MED ORDER — METHOCARBAMOL 500 MG PO TABS: 500.0000 mg | ORAL_TABLET | Freq: Four times a day (QID) | ORAL | 0 refills | Status: DC | PRN

## 2018-12-24 NOTE — Progress Notes (Signed)
Name: Savannah Washington   MRN: [id number]    DOB: June 02, 1961   Date:12/24/2018       Progress Note  Subjective  Chief Complaint  Chief Complaint  Patient presents with  . Numbness    front part of her leg    I connected with@ on 12/24/18 at  2:20 PM EDT by a video enabled telemedicine application and verified that I am speaking with the correct person using two identifiers.  I discussed the limitations of evaluation and management by telemedicine and the availability of in person appointments. The patient expressed understanding and agreed to proceed. Pt is at home and I am in office for phone call today which is performed due to [place] pandemic.  Patient of Dr. Sanda Klein  Patient is at work I am at work   HPI  She states she developed a pain on her left outer hip a few days ago, the pain is described as burning and throbbing, sometimes like a soreness and goes from left outer hip to left groin and sometimes also feels on left anterior chin area, not in the thigh. She denies bowel or bladder incontinence. No rashes, fever or chills. She never had this pain before but had cervical radiculitis in the past and was seen by Ortho. She used ibuprofen and robaxin in the past with good response but she has not taken any medication for this new pain. She has been able to walk and work. Pain is 6/10.   Patient Active Problem List   Diagnosis Date Noted  . Tobacco abuse 12/01/2018  . Preventative health care 05/15/2018  . Emphysema lung ([place]) 12/29/2017  . Aortic atherosclerosis ([place]) 12/29/2017  . Hepatic steatosis 12/29/2017  . COPD (chronic obstructive pulmonary disease) ([place]) 12/05/2017  . Degenerative disc disease, cervical 09/04/2017  . Cervical arthritis 09/04/2017  . Muscle spasm of left shoulder area 09/03/2017  . Hypertriglyceridemia 07/22/2017  . Elevated platelet count 04/11/2017  . Marijuana use 03/17/2017  . Prediabetes 03/14/2017  . Essential hypertension, benign 03/14/2017  .  Hyperlipidemia 03/14/2017  . Medication monitoring encounter 03/14/2017  . Anxiety disorder 03/14/2017    History reviewed. No pertinent surgical history.  Family History  Problem Relation Age of Onset  . Dementia Mother   . Breast cancer Sister 61  . Aneurysm Father   . Heart attack Brother   . Heart defect Brother   . Brain cancer Daughter   . Heart attack Paternal Grandfather   . Drug abuse Sister     Social History   Socioeconomic History  . Marital status: Widowed    Spouse name: Not on file  . Number of children: Not on file  . Years of education: Not on file  . Highest education level: Not on file  Occupational History  . Not on file  Social Needs  . Financial resource strain: Not on file  . Food insecurity:    Worry: Not on file    Inability: Not on file  . Transportation needs:    Medical: Not on file    Non-medical: Not on file  Tobacco Use  . Smoking status: Current Every Day Smoker    Packs/day: 0.50    Years: 44.00    Pack years: 22.00    Types: Cigarettes    Start date: 08/30/2018  . Smokeless tobacco: Never Used  Substance and Sexual Activity  . Alcohol use: Yes    Alcohol/week: 5.0 standard drinks    Types: 1 Glasses of wine,  4 Cans of beer per week  . Drug use: No  . Sexual activity: Not Currently    Birth control/protection: Post-menopausal  Lifestyle  . Physical activity:    Days per week: Not on file    Minutes per session: Not on file  . Stress: Not on file  Relationships  . Social connections:    Talks on phone: Not on file    Gets together: Not on file    Attends religious service: Not on file    Active member of club or organization: Not on file    Attends meetings of clubs or organizations: Not on file    Relationship status: Not on file  . Intimate partner violence:    Fear of current or ex partner: Not on file    Emotionally abused: Not on file    Physically abused: Not on file    Forced sexual activity: Not on file   Other Topics Concern  . Not on file  Social History Narrative  . Not on file     Current Outpatient Medications:  .  buPROPion (WELLBUTRIN XL) 300 MG 24 hr tablet, TAKE 1 TABLET BY MOUTH ONCE DAILY, Disp: 90 tablet, Rfl: 1 .  busPIRone (BUSPAR) 15 MG tablet, Take 1 tablet (15 mg total) by mouth 2 (two) times daily., Disp: 60 tablet, Rfl: 5 .  calcium-vitamin D (OSCAL WITH D) 250-125 MG-UNIT tablet, Take 1 tablet by mouth daily., Disp: , Rfl:  .  cetirizine (ZYRTEC) 10 MG tablet, Take 10 mg by mouth daily., Disp: , Rfl:  .  fenofibrate (TRICOR) 48 MG tablet, Take 1 tablet (48 mg total) by mouth daily., Disp: 30 tablet, Rfl: 1 .  Fluticasone-Umeclidin-Vilant (TRELEGY ELLIPTA) 100-62.5-25 MCG/INH AEPB, Inhale 1 each into the lungs daily. (this replaces BREO), Disp: 1 each, Rfl: 12 .  ibuprofen (ADVIL,MOTRIN) 600 MG tablet, TAKE 1 TABLET BY MOUTH EVERY 8 HOURS AS NEEDED .DO  NOT  TAKE  WITH  MELOXICAM OR OTHER NSAIDS, Disp: 20 tablet, Rfl: 0 .  Icosapent Ethyl (VASCEPA) 1 g CAPS, Take 2 capsules (2 g total) by mouth 2 (two) times daily., Disp: 120 capsule, Rfl: 11 .  lisinopril (PRINIVIL,ZESTRIL) 5 MG tablet, Take 1 tablet (5 mg total) by mouth daily., Disp: 30 tablet, Rfl: 0 .  metFORMIN (GLUCOPHAGE) 1000 MG tablet, TAKE 1 TABLET BY MOUTH TWICE DAILY WITH A MEAL, Disp: 60 tablet, Rfl: 0 .  methocarbamol (ROBAXIN) 500 MG tablet, TAKE 1 TABLET BY MOUTH EVERY 6 HOURS AS NEEDED FOR MUSCLE SPASM, Disp: 30 tablet, Rfl: 0 .  Multiple Vitamins-Minerals (MULTI COMPLETE PO), Take 1,000 Units by mouth daily., Disp: , Rfl:  .  nystatin (MYCOSTATIN/NYSTOP) powder, Apply topically 3 (three) times daily as needed., Disp: 15 g, Rfl: 2 .  PROAIR HFA 108 (90 Base) MCG/ACT inhaler, INHALE 2 PUFFS INTO LUNGS EVERY 6 HOURS AS NEEDED FOR WHEEZING FOR SHORTNESS OF BREATH, Disp: 1 Inhaler, Rfl: 1 .  Turmeric 500 MG CAPS, Take 500 mg by mouth daily., Disp: , Rfl:  .  aspirin EC 81 MG tablet, Take 1 tablet (81 mg total)  by mouth daily., Disp: , Rfl:  .  pravastatin (PRAVACHOL) 40 MG tablet, Take 1 tablet (40 mg total) by mouth daily. For cholesterol; this replaces atorvastatin (Patient not taking: Reported on 12/24/2018), Disp: 30 tablet, Rfl: 1  No Known Allergies  I personally reviewed active problem list, medication list, allergies, family history with the patient/caregiver today.   ROS  Ten systems  reviewed and is negative except as mentioned in HPI   Objective  Virtual encounter, vitals not obtained.  There is no height or weight on file to calculate BMI.  Physical Exam  Awake and alert , normal speech She has normal rom of spine, she sat down and negative straight leg raise  PHQ2/9: Depression screen [facility] 2/9 12/24/2018 11/30/2018 05/08/2018 05/08/2018 03/06/2018  Decreased Interest 0 0 0 0 0  Down, Depressed, Hopeless 0 0 0 0 1  PHQ - 2 Score 0 0 0 0 1  Altered sleeping 1 0 0 - 0  Tired, decreased energy 0 0 0 - 0  Change in appetite 0 0 0 - 0  Feeling bad or failure about yourself  0 0 0 - 0  Trouble concentrating 0 0 0 - 1  Moving slowly or fidgety/restless 0 0 0 - 0  Suicidal thoughts 0 0 0 - 0  PHQ-9 Score 1 0 0 - 2  Difficult doing work/chores Not difficult at all Not difficult at all - - Not difficult at all   PHQ-2/9 Result is negative.    Fall Risk: Fall Risk  12/24/2018 11/30/2018 05/08/2018 01/12/2018 12/05/2017  Falls in the past year? 0 0 No No No  Number falls in past yr: 0 - - - -  Injury with Fall? 0 - - - -     Assessment & Plan  1. Left lumbar radiculitis  Discussed prednisone but because of history of pre-diabetes and good response to ibuprofen in the past we will refill ibuprofen and robaxin today, she has seen ortho in the past and can schedule follow up with Arvella Nigh if needed  - methocarbamol (ROBAXIN) 500 MG tablet; Take 1 tablet (500 mg total) by mouth every 6 (six) hours as needed for muscle spasms.  Dispense: 30 tablet; Refill: 0 - ibuprofen (ADVIL,MOTRIN) 600 MG  tablet; Take 1 tablet (600 mg total) by mouth 3 (three) times daily.  Dispense: 30 tablet; Refill: 0  I discussed the assessment and treatment plan with the patient. The patient was provided an opportunity to ask questions and all were answered. The patient agreed with the plan and demonstrated an understanding of the instructions.  The patient was advised to call back or seek an in-person evaluation if the symptoms worsen or if the condition fails to improve as anticipated.  I provided 15 minutes of non-face-to-face time during this encounter.

## 2018-12-30 ENCOUNTER — Emergency Department
Admission: EM | Admit: 2018-12-30 | Discharge: 2018-12-30 | Disposition: A | Discharge status: Home / Self Care | Payer: BLUE CROSS/BLUE SHIELD | Attending: Student in an Organized Health Care Education/Training Program | Admitting: Student in an Organized Health Care Education/Training Program

## 2018-12-30 ENCOUNTER — Encounter: Payer: Self-pay | Admitting: Emergency Medicine

## 2018-12-30 ENCOUNTER — Other Ambulatory Visit: Payer: Self-pay

## 2018-12-30 ENCOUNTER — Other Ambulatory Visit: Payer: Self-pay | Admitting: Family Medicine

## 2018-12-30 ENCOUNTER — Emergency Department: Payer: BLUE CROSS/BLUE SHIELD

## 2018-12-30 DIAGNOSIS — M51379 Other intervertebral disc degeneration, lumbosacral region without mention of lumbar back pain or lower extremity pain: Secondary | ICD-10-CM

## 2018-12-30 DIAGNOSIS — M25552 Pain in left hip: Secondary | ICD-10-CM | POA: Diagnosis present

## 2018-12-30 DIAGNOSIS — I10 Essential (primary) hypertension: Secondary | ICD-10-CM | POA: Diagnosis not present

## 2018-12-30 DIAGNOSIS — F17210 Nicotine dependence, cigarettes, uncomplicated: Secondary | ICD-10-CM | POA: Diagnosis not present

## 2018-12-30 DIAGNOSIS — Z79899 Other long term (current) drug therapy: Secondary | ICD-10-CM | POA: Diagnosis not present

## 2018-12-30 DIAGNOSIS — J449 Chronic obstructive pulmonary disease, unspecified: Secondary | ICD-10-CM | POA: Diagnosis not present

## 2018-12-30 DIAGNOSIS — M5137 Other intervertebral disc degeneration, lumbosacral region: Secondary | ICD-10-CM

## 2018-12-30 DIAGNOSIS — M5136 Other intervertebral disc degeneration, lumbar region: Secondary | ICD-10-CM

## 2018-12-30 DIAGNOSIS — Z7982 Long term (current) use of aspirin: Secondary | ICD-10-CM | POA: Insufficient documentation

## 2018-12-30 DIAGNOSIS — M5416 Radiculopathy, lumbar region: Secondary | ICD-10-CM

## 2018-12-30 MED ORDER — ICOSAPENT ETHYL 1 G PO CAPS: 2.0000 | ORAL_CAPSULE | Freq: Two times a day (BID) | ORAL | 3 refills | Status: DC

## 2018-12-30 MED ORDER — TRAMADOL HCL 50 MG PO TABS: 50.0000 mg | ORAL_TABLET | Freq: Four times a day (QID) | ORAL | 0 refills | Status: DC | PRN

## 2018-12-30 MED ORDER — KETOROLAC TROMETHAMINE 10 MG PO TABS: 10.0000 mg | ORAL_TABLET | Freq: Four times a day (QID) | ORAL | 0 refills | Status: DC | PRN

## 2018-12-30 MED ORDER — KETOROLAC TROMETHAMINE 30 MG/ML IJ SOLN
30.0000 mg | Freq: Once | INTRAMUSCULAR | Status: AC
  Administered 2018-12-30: 30 mg via INTRAMUSCULAR
  Filled 2018-12-30: qty 1

## 2018-12-30 MED ORDER — PREDNISONE 10 MG (21) PO TBPK: ORAL_TABLET | ORAL | 0 refills | Status: DC

## 2018-12-30 NOTE — ED Notes (Signed)

## 2018-12-30 NOTE — ED Notes (Signed)
Hip pain x 3weeks. Pt taken medications (methocarbam 500mg  x 6hrs) for muscle spasms, but states it is not working. Shin pain started 3 days ago. Pt states they tried to start new cholesterol medication, took only one day due to experiencing generalized muscle pain. Waited around 3 days before starting her prior med for cholesterol.

## 2018-12-30 NOTE — Telephone Encounter (Signed)
Lovaza requested by pharmacy Denied Wrong agent She is on Vascepa

## 2018-12-30 NOTE — ED Provider Notes (Signed)
[facility] Emergency Department Provider Note ____________________________________________  Time seen: Approximately 6:21 PM  I have reviewed the triage vital signs and the nursing notes.  HISTORY  Chief Complaint Sciatica   HPI Savannah Washington is a 58 y.o. female who presents to the emergency department for treatment and evaluation of 3 days of left hip pain with radiation into the left leg and groin area. No injury. No improvement with muscle relaxer.   Past Medical History:  Diagnosis Date  . Anxiety   . Aortic atherosclerosis ([place]) 12/29/2017   Chest CT March 2019  . Cervical arthritis 09/04/2017   xrays Dec 2018  . Degenerative disc disease, cervical 09/04/2017   Xrays Dec 2018  . Depression   . Emphysema lung ([place]) 12/29/2017   Chest CT March 2019  . Hepatic steatosis 12/29/2017   Chest CT March 2019  . Hyperlipidemia   . Hypertension   . Prediabetes 03/14/2017    Patient Active Problem List   Diagnosis Date Noted  . Tobacco abuse 12/01/2018  . Preventative health care 05/15/2018  . Emphysema lung ([place]) 12/29/2017  . Aortic atherosclerosis ([place]) 12/29/2017  . Hepatic steatosis 12/29/2017  . COPD (chronic obstructive pulmonary disease) ([place]) 12/05/2017  . Degenerative disc disease, cervical 09/04/2017  . Cervical arthritis 09/04/2017  . Muscle spasm of left shoulder area 09/03/2017  . Hypertriglyceridemia 07/22/2017  . Elevated platelet count 04/11/2017  . Marijuana use 03/17/2017  . Prediabetes 03/14/2017  . Essential hypertension, benign 03/14/2017  . Hyperlipidemia 03/14/2017  . Medication monitoring encounter 03/14/2017  . Anxiety disorder 03/14/2017    History reviewed. No pertinent surgical history.  Prior to Admission medications   Medication Sig Start Date End Date Taking? Authorizing Provider  aspirin EC 81 MG tablet Take 1 tablet (81 mg total) by mouth daily. 03/14/17   Lada, Satira Anis, MD  buPROPion (WELLBUTRIN XL) 300 MG 24  hr tablet TAKE 1 TABLET BY MOUTH ONCE DAILY 08/11/18   Lada, Satira Anis, MD  busPIRone (BUSPAR) 15 MG tablet Take 1 tablet (15 mg total) by mouth 2 (two) times daily. 12/05/17   Lada, Satira Anis, MD  calcium-vitamin D (OSCAL WITH D) 250-125 MG-UNIT tablet Take 1 tablet by mouth daily.    [provider]  cetirizine (ZYRTEC) 10 MG tablet Take 10 mg by mouth daily.    [provider]  fenofibrate (TRICOR) 48 MG tablet Take 1 tablet (48 mg total) by mouth daily. 12/15/18   Lada, Satira Anis, MD  Fluticasone-Umeclidin-Vilant (TRELEGY ELLIPTA) 100-62.5-25 MCG/INH AEPB Inhale 1 each into the lungs daily. (this replaces BREO) 03/06/18   Lada, Satira Anis, MD  ibuprofen (ADVIL,MOTRIN) 600 MG tablet Take 1 tablet (600 mg total) by mouth 3 (three) times daily. 12/24/18   Steele Sizer, MD  Icosapent Ethyl (VASCEPA) 1 g CAPS Take 2 capsules (2 g total) by mouth 2 (two) times daily. 12/30/18   Arnetha Courser, MD  ketorolac (TORADOL) 10 MG tablet Take 1 tablet (10 mg total) by mouth every 6 (six) hours as needed. Do not start until finished with prednisone 12/30/18   Ahlam Piscitelli B, FNP  lisinopril (PRINIVIL,ZESTRIL) 5 MG tablet Take 1 tablet (5 mg total) by mouth daily. 11/16/18   Poulose, Bethel Born, NP  metFORMIN (GLUCOPHAGE) 1000 MG tablet TAKE 1 TABLET BY MOUTH TWICE DAILY WITH A MEAL 12/12/18   Lada, Satira Anis, MD  methocarbamol (ROBAXIN) 500 MG tablet Take 1 tablet (500 mg total) by mouth every 6 (six) hours  as needed for muscle spasms. 12/24/18   Steele Sizer, MD  Multiple Vitamins-Minerals (MULTI COMPLETE PO) Take 1,000 Units by mouth daily.    [provider]  nystatin (MYCOSTATIN/NYSTOP) powder Apply topically 3 (three) times daily as needed. 08/18/18   Arnetha Courser, MD  pravastatin (PRAVACHOL) 40 MG tablet Take 1 tablet (40 mg total) by mouth daily. For cholesterol; this replaces atorvastatin Patient not taking: Reported on 12/24/2018 12/15/18   Arnetha Courser, MD  predniSONE  (STERAPRED UNI-PAK 21 TAB) 10 MG (21) TBPK tablet Take 6 tablets on the first day and decrease by 1 tablet each day until finished. 12/30/18   Keary Hanak B, FNP  PROAIR HFA 108 (90 Base) MCG/ACT inhaler INHALE 2 PUFFS INTO LUNGS EVERY 6 HOURS AS NEEDED FOR WHEEZING FOR SHORTNESS OF BREATH 11/09/18   Lada, Satira Anis, MD  traMADol (ULTRAM) 50 MG tablet Take 1 tablet (50 mg total) by mouth every 6 (six) hours as needed. 12/30/18   Zakaria Sedor B, FNP  Turmeric 500 MG CAPS Take 500 mg by mouth daily.    [provider]    Allergies Patient has no known allergies.  Family History  Problem Relation Age of Onset  . Dementia Mother   . Breast cancer Sister 41  . Aneurysm Father   . Heart attack Brother   . Heart defect Brother   . Brain cancer Daughter   . Heart attack Paternal Grandfather   . Drug abuse Sister     Social History Social History   Tobacco Use  . Smoking status: Current Every Day Smoker    Packs/day: 0.50    Years: 44.00    Pack years: 22.00    Types: Cigarettes    Start date: 08/30/2018  . Smokeless tobacco: Never Used  Substance Use Topics  . Alcohol use: Yes    Alcohol/week: 5.0 standard drinks    Types: 1 Glasses of wine, 4 Cans of beer per week  . Drug use: No    Review of Systems Constitutional: Well appearing. Respiratory: Negative for dyspnea. Cardiovascular: Negative for change in skin temperature or color. Musculoskeletal:   Negative for chronic steroid use   Negative for trauma in the presence of osteoporosis  Negative for age over 36 and trauma.  Negative for constitutional symptoms, or history of cancer   Negative for pain worse at night. Skin: Negative for rash, lesion, or wound.  Genitourinary: Negative for urinary retention. Rectal: Negative for fecal incontinence or new onset constipation/bowel habit changes. Hematological/Immunilogical: Negative for immunosuppression, IV drug use, or fever Neurological: Positive for burning,  tingling, numb, electric, radiating pain in the right lower extremity.                        Negative for saddle anesthesia.                        Negative for focal neurologic deficit, progressive or disabling symptoms             Negative for saddle anesthesia. ____________________________________________   PHYSICAL EXAM:  VITAL SIGNS: ED Triage Vitals  Enc Vitals Group     BP 12/30/18 1649 (!) 143/98     Pulse Rate 12/30/18 1649 86     Resp 12/30/18 1649 18     Temp 12/30/18 1649 98.2 F (36.8 C)     Temp Source 12/30/18 1649 Oral     SpO2 12/30/18 1649 95 %  Weight 12/30/18 1647 155 lb (70.3 kg)     Height 12/30/18 1647 5\' 2"  (1.575 m)     Head Circumference --      Peak Flow --      Pain Score 12/30/18 1647 8     Pain Loc --      Pain Edu? --      Excl. in [place]? --     Constitutional: Alert and oriented. Well appearing and in no acute distress. Eyes: Conjunctivae are clear without discharge or drainage.  Head: Atraumatic. Neck: Full, active range of motion. Respiratory: Respirations even and unlabored. Musculoskeletal: Full ROM of the back and extremities, Strength 5/5 of the lower extremities as tested. Neurologic: Reflexes of the lower extremities are 2+. Negative straight leg raise on the left side. Skin: Atraumatic.  Psychiatric: Behavior and affect are normal.  ____________________________________________   LABS (all labs ordered are listed, but only abnormal results are displayed)  Labs Reviewed - No data to display ____________________________________________  RADIOLOGY  Moderate degenerative disc disease with grade 1 anterolisthesis at L4-L5 and severe degenerative disc disease at L5-S1. ____________________________________________   PROCEDURES  Procedure(s) performed:  Procedures ____________________________________________   INITIAL IMPRESSION / ASSESSMENT AND PLAN / ED COURSE  KATHYRN WARMUTH is a 58 y.o. female who presents to the  emergency department for treatment of left hip pain that radiates into left groin and left lower extremity down to the shin. X-ray shows severe degenerative disc disease. She had some relief with Toradol. She will receive prescriptions for prednisone, tramadol, and toradol. She was advised not to take the toradol until she is finished with the prednisone. She is to follow up with primary care or return to the ER for symptoms that change or worsen.  Medications  ketorolac (TORADOL) 30 MG/ML injection 30 mg (30 mg Intramuscular Given 12/30/18 1735)    ED Discharge Orders         Ordered    predniSONE (STERAPRED UNI-PAK 21 TAB) 10 MG (21) TBPK tablet     12/30/18 1839    ketorolac (TORADOL) 10 MG tablet  Every 6 hours PRN     12/30/18 1839    traMADol (ULTRAM) 50 MG tablet  Every 6 hours PRN     12/30/18 1839           Pertinent labs & imaging results that were available during my care of the patient were reviewed by me and considered in my medical decision making (see chart for details).  _________________________________________   FINAL CLINICAL IMPRESSION(S) / ED DIAGNOSES  Final diagnoses:  Acute left lumbar radiculopathy  Degenerative disc disease at L5-S1 level     If controlled substance prescribed during this visit, 12 month history viewed on the [place] prior to issuing an initial prescription for Schedule II or III opiod.   Victorino Dike, FNP 12/30/18 1919    Merlyn Lot, MD 12/30/18 2003

## 2018-12-30 NOTE — Discharge Instructions (Signed)
Please return for any symptoms that worsen if unable to see primary care.

## 2018-12-30 NOTE — ED Triage Notes (Signed)
Pt in via POV, reports left hip pain with radiation down into leg x approximately 2 weeks.  Reports receiving muscle relaxer from PCP without any relief.  Ambulatory to triage; NAD noted at this time.

## 2019-01-06 ENCOUNTER — Other Ambulatory Visit: Payer: Self-pay | Admitting: Family Medicine

## 2019-01-07 NOTE — Telephone Encounter (Signed)
Lab Results  Component Value Date   CREATININE 0.84 12/14/2018   Lab Results  Component Value Date   K 4.3 12/14/2018

## 2019-01-29 ENCOUNTER — Ambulatory Visit: Admission: RE | Admit: 2019-01-29 | Payer: BLUE CROSS/BLUE SHIELD | Source: Ambulatory Visit

## 2019-02-01 ENCOUNTER — Other Ambulatory Visit: Payer: Self-pay | Admitting: Family Medicine

## 2019-02-05 ENCOUNTER — Telehealth: Payer: Self-pay | Admitting: Family Medicine

## 2019-02-05 ENCOUNTER — Telehealth: Payer: Self-pay | Admitting: *Deleted

## 2019-02-05 NOTE — Telephone Encounter (Signed)
Contacted regarding lung screening scan reschedule. appt given.

## 2019-02-05 NOTE — Telephone Encounter (Signed)
Copied from [place] [id number]. Topic: Quick Communication - Rx Refill/Question >> Feb 05, 2019  1:20 PM Burchel, Abbi R wrote: Medication: PROAIR HFA 108 (90 Base) MCG/ACT inhaler  Pharmacy requesting permission to fill generic.   Preferred Pharmacy:  [facility] [address]), [place]  [phone] (Phone) [phone] (Fax)

## 2019-02-05 NOTE — Telephone Encounter (Signed)
Already done

## 2019-02-08 ENCOUNTER — Ambulatory Visit: Payer: BLUE CROSS/BLUE SHIELD

## 2019-02-12 ENCOUNTER — Other Ambulatory Visit: Payer: Self-pay

## 2019-02-12 ENCOUNTER — Ambulatory Visit
Admission: RE | Admit: 2019-02-12 | Discharge: 2019-02-12 | Disposition: A | Discharge status: Home / Self Care | Payer: BLUE CROSS/BLUE SHIELD | Source: Ambulatory Visit | Attending: Nurse Practitioner | Admitting: Nurse Practitioner

## 2019-02-12 ENCOUNTER — Ambulatory Visit: Admission: RE | Admit: 2019-02-12 | Payer: BLUE CROSS/BLUE SHIELD | Source: Ambulatory Visit

## 2019-02-12 DIAGNOSIS — Z122 Encounter for screening for malignant neoplasm of respiratory organs: Secondary | ICD-10-CM | POA: Diagnosis not present

## 2019-02-16 ENCOUNTER — Encounter: Payer: Self-pay | Admitting: *Deleted

## 2019-02-17 ENCOUNTER — Other Ambulatory Visit: Payer: Self-pay | Admitting: Family Medicine

## 2019-02-17 DIAGNOSIS — M5416 Radiculopathy, lumbar region: Secondary | ICD-10-CM

## 2019-02-19 ENCOUNTER — Other Ambulatory Visit: Payer: Self-pay

## 2019-02-19 ENCOUNTER — Telehealth (INDEPENDENT_AMBULATORY_CARE_PROVIDER_SITE_OTHER): Payer: BLUE CROSS/BLUE SHIELD | Admitting: Cardiothoracic Surgery

## 2019-02-19 DIAGNOSIS — R911 Solitary pulmonary nodule: Secondary | ICD-10-CM

## 2019-02-19 NOTE — Progress Notes (Signed)
Virtual Visit via Telephone Note  I connected with@ on 02/19/19 at 10:30 AM EDT by telephone and verified that I am speaking with the correct person using two identifiers.   I discussed the limitations, risks, security and privacy concerns of performing an evaluation and management service by telephone and the availability of in person appointments. I also discussed with the patient that there may be a patient responsible charge related to this service. The patient expressed understanding and agreed to proceed.  This service was provided via telemedicine.  The patient consented to the visit being carried via telemedicine.  Patient's location: Home  Provider's location: Office  Referring Provider: Dr. Varney Biles  People participating in this telemedicine visit: Myself and the patient   History of Present Illness: I had the opportunity today to call [name] on the telephone to review the results of her CT scan.  She is a 58 year old woman with a longstanding smoking history of continues to smoke about a pack of cigarettes a day.  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  She is not had any hemoptysis.  She states that she would like to stop smoking and I have suggested she contact Dr. Jacinto Reap to review that.    Observations/Objective: Her CT scan was independently reviewed by me and I reviewed my observations with the patient.  There are scattered groundglass nodules throughout the lungs.  The largest is in the right upper lobe measuring about 7 mm.  There is no other obvious lung pathology.   Assessment and Plan: I have recommended to her that she speak with Dr. Lavonia Drafts about smoking cessation and she is agreeable to that.  We will bring her back again in 1 year with a another low-dose CT of the chest.    I discussed the assessment and treatment plan with the patient. The patient was provided an opportunity to ask questions and all were  answered. The patient agreed with the plan and demonstrated an understanding of the instructions.   The patient was advised to call back or seek an in-person evaluation if the symptoms worsen or if the condition fails to improve as anticipated.  I provided 15 minutes of non-face-to-face time during this encounter.   Nestor Lewandowsky, MD

## 2019-02-26 ENCOUNTER — Telehealth: Payer: Self-pay | Admitting: Family Medicine

## 2019-02-26 DIAGNOSIS — F418 Other specified anxiety disorders: Secondary | ICD-10-CM

## 2019-02-26 DIAGNOSIS — Z87891 Personal history of nicotine dependence: Secondary | ICD-10-CM

## 2019-02-26 DIAGNOSIS — R7303 Prediabetes: Secondary | ICD-10-CM

## 2019-02-27 ENCOUNTER — Encounter: Payer: Self-pay | Admitting: Family Medicine

## 2019-02-27 NOTE — Telephone Encounter (Signed)
Please schedule patient for follow up in the next 30 days.  

## 2019-03-01 NOTE — Telephone Encounter (Signed)
Pt scheduled  

## 2019-03-08 ENCOUNTER — Other Ambulatory Visit: Payer: Self-pay | Admitting: Nurse Practitioner

## 2019-03-08 ENCOUNTER — Other Ambulatory Visit: Payer: Self-pay | Admitting: Family Medicine

## 2019-03-08 ENCOUNTER — Encounter: Payer: Self-pay | Admitting: Family Medicine

## 2019-03-08 DIAGNOSIS — M5416 Radiculopathy, lumbar region: Secondary | ICD-10-CM

## 2019-03-24 ENCOUNTER — Other Ambulatory Visit: Payer: Self-pay

## 2019-03-24 ENCOUNTER — Encounter: Payer: Self-pay | Admitting: Nurse Practitioner

## 2019-03-24 ENCOUNTER — Ambulatory Visit: Payer: BLUE CROSS/BLUE SHIELD | Admitting: Nurse Practitioner

## 2019-03-24 VITALS — BP 120/78 | HR 90 | Temp 98.0°F | Resp 14 | Ht 62.0 in | Wt 149.5 lb

## 2019-03-24 DIAGNOSIS — Z5181 Encounter for therapeutic drug level monitoring: Secondary | ICD-10-CM

## 2019-03-24 DIAGNOSIS — J449 Chronic obstructive pulmonary disease, unspecified: Secondary | ICD-10-CM | POA: Diagnosis not present

## 2019-03-24 DIAGNOSIS — I10 Essential (primary) hypertension: Secondary | ICD-10-CM

## 2019-03-24 DIAGNOSIS — E782 Mixed hyperlipidemia: Secondary | ICD-10-CM

## 2019-03-24 DIAGNOSIS — K760 Fatty (change of) liver, not elsewhere classified: Secondary | ICD-10-CM

## 2019-03-24 DIAGNOSIS — Z87891 Personal history of nicotine dependence: Secondary | ICD-10-CM

## 2019-03-24 DIAGNOSIS — I700 Atherosclerosis of aorta: Secondary | ICD-10-CM | POA: Diagnosis not present

## 2019-03-24 DIAGNOSIS — R7303 Prediabetes: Secondary | ICD-10-CM

## 2019-03-24 DIAGNOSIS — F411 Generalized anxiety disorder: Secondary | ICD-10-CM

## 2019-03-24 DIAGNOSIS — F418 Other specified anxiety disorders: Secondary | ICD-10-CM

## 2019-03-24 LAB — HEPATIC FUNCTION PANEL
AG Ratio: 1.9 (calc) (ref 1.0–2.5)
ALT: 17 U/L (ref 6–29)
AST: 33 U/L (ref 10–35)
Albumin: 4.4 g/dL (ref 3.6–5.1)
Alkaline phosphatase (APISO): 56 U/L (ref 37–153)
Bilirubin, Direct: 0.1 mg/dL (ref 0.0–0.2)
Globulin: 2.3 g/dL (ref 1.9–3.7)
Indirect Bilirubin: 0.3 mg/dL (ref 0.2–1.2)
Total Bilirubin: 0.4 mg/dL (ref 0.2–1.2)
Total Protein: 6.7 g/dL (ref 6.1–8.1)

## 2019-03-24 LAB — LIPID PANEL
Cholesterol: 198 mg/dL (ref <200)
HDL: 48 mg/dL — ABNORMAL LOW (ref >=50)
Non-HDL Cholesterol (Calc): 150 mg/dL (calc) — ABNORMAL HIGH (ref <130)
Total CHOL/HDL Ratio: 4.1 (calc) (ref <5.0)
Triglycerides: 583 mg/dL — ABNORMAL HIGH (ref <150)

## 2019-03-24 MED ORDER — VASCEPA 1 G PO CAPS: 2.0000 | ORAL_CAPSULE | Freq: Two times a day (BID) | ORAL | 3 refills | Status: DC

## 2019-03-24 MED ORDER — METFORMIN HCL 1000 MG PO TABS: 1000.0000 mg | ORAL_TABLET | Freq: Two times a day (BID) | ORAL | 1 refills | Status: DC

## 2019-03-24 MED ORDER — TRELEGY ELLIPTA 100-62.5-25 MCG/INH IN AEPB: 1.0000 | INHALATION_SPRAY | Freq: Every day | RESPIRATORY_TRACT | 12 refills | Status: DC

## 2019-03-24 MED ORDER — LISINOPRIL 5 MG PO TABS: 5.0000 mg | ORAL_TABLET | Freq: Every day | ORAL | 1 refills | Status: DC

## 2019-03-24 MED ORDER — BUPROPION HCL ER (XL) 300 MG PO TB24: 300.0000 mg | ORAL_TABLET | Freq: Every day | ORAL | 1 refills | Status: DC

## 2019-03-24 MED ORDER — ATORVASTATIN CALCIUM 40 MG PO TABS: 40.0000 mg | ORAL_TABLET | Freq: Every day | ORAL | 1 refills | Status: DC

## 2019-03-24 MED ORDER — BUSPIRONE HCL 15 MG PO TABS: 15.0000 mg | ORAL_TABLET | Freq: Two times a day (BID) | ORAL | 1 refills | Status: DC

## 2019-03-24 NOTE — Addendum Note (Signed)
Addended by: Fredderick Severance on: 03/24/2019 11:14 AM   Modules accepted: Orders

## 2019-03-24 NOTE — Patient Instructions (Addendum)
Bad cholesterol, also called low-density lipoprotein (LDL), carries cholesterol and other fats that your liver makes to your body tissue. If it builds up in blood vessels, LDL can cause heart disease and other health problems. Your LDL level should be below 100. If you have diabetes or a possible heart problem, your LDL should be below 70.  Eat: Eat 20 to 30 grams of soluble fiber every day. Foods such as fruits and vegetables, whole grains, beans, peas, nuts, and seeds can help lower LDL. Avoid: Saturated fats (Dairy foods - such as butter, cream, ghee, regular-fat milk and cheese. Meat - such as fatty cuts of beef, pork and lamb, processed meats like salami, sausages and the skin on chicken. Lard., fatty snack foods, cakes, biscuits, pies and deep fried foods) Avoid smoking    Steps to Quit Smoking  Smoking tobacco can be harmful to your health and can affect almost every organ in your body. Smoking puts you, and those around you, at risk for developing many serious chronic diseases. Quitting smoking is difficult, but it is one of the best things that you can do for your health. It is never too late to quit. What are the benefits of quitting smoking? When you quit smoking, you lower your risk of developing serious diseases and conditions, such as:  Lung cancer or lung disease, such as COPD.  Heart disease.  Stroke.  Heart attack.  Infertility.  Osteoporosis and bone fractures. Additionally, symptoms such as coughing, wheezing, and shortness of breath may get better when you quit. You may also find that you get sick less often because your body is stronger at fighting off colds and infections. If you are pregnant, quitting smoking can help to reduce your chances of having a baby of low birth weight. How do I get ready to quit? When you decide to quit smoking, create a plan to make sure that you are successful. Before you quit:  Pick a date to quit. Set a date within the next two weeks to  give you time to prepare.  Write down the reasons why you are quitting. Keep this list in places where you will see it often, such as on your bathroom mirror or in your car or wallet.  Identify the people, places, things, and activities that make you want to smoke (triggers) and avoid them. Make sure to take these actions: ? Throw away all cigarettes at home, at work, and in your car. ? Throw away smoking accessories, such as Scientist, research (medical). ? Clean your car and make sure to empty the ashtray. ? Clean your home, including curtains and carpets.  Tell your family, friends, and coworkers that you are quitting. Support from your loved ones can make quitting easier.  Talk with your health care provider about your options for quitting smoking.  Find out what treatment options are covered by your health insurance. What strategies can I use to quit smoking? Talk with your healthcare provider about different strategies to quit smoking. Some strategies include:  Quitting smoking altogether instead of gradually lessening how much you smoke over a period of time. Research shows that quitting "cold [place]" is more successful than gradually quitting.  Attending in-person counseling to help you build problem-solving skills. You are more likely to have success in quitting if you attend several counseling sessions. Even short sessions of 10 minutes can be effective.  Finding resources and support systems that can help you to quit smoking and remain smoke-free after you quit.  These resources are most helpful when you use them often. They can include: ? Online chats with a Social worker. ? Telephone quitlines. ? Careers information officer. ? Support groups or group counseling. ? Text messaging programs. ? Mobile phone applications.  Taking medicines to help you quit smoking. (If you are pregnant or breastfeeding, talk with your health care provider first.) Some medicines contain nicotine and some do  not. Both types of medicines help with cravings, but the medicines that include nicotine help to relieve withdrawal symptoms. Your health care provider may recommend: ? Nicotine patches, gum, or lozenges. ? Nicotine inhalers or sprays. ? Non-nicotine medicine that is taken by mouth. Talk with your health care provider about combining strategies, such as taking medicines while you are also receiving in-person counseling. Using these two strategies together makes you more likely to succeed in quitting than if you used either strategy on its own. If you are pregnant or breastfeeding, talk with your health care provider about finding counseling or other support strategies to quit smoking. Do not take medicine to help you quit smoking unless told to do so by your health care provider. What things can I do to make it easier to quit? Quitting smoking might feel overwhelming at first, but there is a lot that you can do to make it easier. Take these important actions:  Reach out to your family and friends and ask that they support and encourage you during this time. Call telephone quitlines, reach out to support groups, or work with a counselor for support.  Ask people who smoke to avoid smoking around you.  Avoid places that trigger you to smoke, such as bars, parties, or smoke-break areas at work.  Spend time around people who do not smoke.  Lessen stress in your life, because stress can be a smoking trigger for some people. To lessen stress, try: ? Exercising regularly. ? Deep-breathing exercises. ? Yoga. ? Meditating. ? Performing a body scan. This involves closing your eyes, scanning your body from head to toe, and noticing which parts of your body are particularly tense. Purposefully relax the muscles in those areas.  Download or purchase mobile phone or tablet apps (applications) that can help you stick to your quit plan by providing reminders, tips, and encouragement. There are many free apps,  such as QuitGuide from the [employer] Office manager for Disease Control and Prevention). You can find other support for quitting smoking (smoking cessation) through smokefree.gov and other websites. How will I feel when I quit smoking? Within the first 24 hours of quitting smoking, you may start to feel some withdrawal symptoms. These symptoms are usually most noticeable 2-3 days after quitting, but they usually do not last beyond 2-3 weeks. Changes or symptoms that you might experience include:  Mood swings.  Restlessness, anxiety, or irritation.  Difficulty concentrating.  Dizziness.  Strong cravings for sugary foods in addition to nicotine.  Mild weight gain.  Constipation.  Nausea.  Coughing or a sore throat.  Changes in how your medicines work in your body.  A depressed mood.  Difficulty sleeping (insomnia). After the first 2-3 weeks of quitting, you may start to notice more positive results, such as:  Improved sense of smell and taste.  Decreased coughing and sore throat.  Slower heart rate.  Lower blood pressure.  Clearer skin.  The ability to breathe more easily.  Fewer sick days. Quitting smoking is very challenging for most people. Do not get discouraged if you are not successful the first  time. Some people need to make many attempts to quit before they achieve long-term success. Do your best to stick to your quit plan, and talk with your health care provider if you have any questions or concerns. This information is not intended to replace advice given to you by your health care provider. Make sure you discuss any questions you have with your health care provider. Document Released: 09/10/2001 Document Revised: 04/22/2017 Document Reviewed: 01/31/2015 Elsevier Interactive Patient Education  2019 [employer].

## 2019-03-24 NOTE — Progress Notes (Signed)
Name: Savannah Washington   MRN: [id number]    DOB: 31-Jan-1961   Date:03/24/2019       Progress Note  Subjective  Chief Complaint  Chief Complaint  Patient presents with  . Medication Refill    HPI  Patient requesting ear check due to intermittent ear fullness. Denies pain, discharge, sinus pressure or pain, fevers or chills.   Hypertension Patient is on lisinopril 5mg  daily.  Takes medications as prescribed with no missed doses a month.  She is relatively compliant with low-salt diet.  Denies chest pain, headaches, blurry vision. BP Readings from Last 3 Encounters:  03/24/19 120/78  12/30/18 (!) 144/99  11/30/18 128/70    Prediabetes Patient is taking metformin 1000mg  BID. Takes medications as prescribed with no missed doses a month.  Diet: eating out maybe 2 times a week, fried foods- 3 times a week. Eats mostly chicken, doesn't snack much- if she does its with fruits  Denies polyphagia, polydipsia, polyuria.  Lab Results  Component Value Date   HGBA1C 6.2 (H) 12/14/2018    Hyperlipidemia Patient taking atorvastatin 40mg  daily, She has not been taking the Vascepa 4g daily. Takes medications as prescribed with no missed doses a month. Patient states she stopped the atorvastatin and started tricor and had significant myaglias with this. Patient has aortic atherosclerosis and fatty liver disease.  Denies myalgias Lab Results  Component Value Date   CHOL 169 12/14/2018   HDL 45 (L) 12/14/2018   LDLCALC  12/14/2018     Comment:     . LDL cholesterol not calculated. Triglyceride levels greater than 400 mg/dL invalidate calculated LDL results. . Reference range: <100 . Desirable range <100 mg/dL for primary prevention;   <70 mg/dL for patients with CHD or diabetic patients  with > or = 2 CHD risk factors. Marland Kitchen LDL-C is now calculated using the Martin-Hopkins  calculation, which is a validated novel method providing  better accuracy than the Friedewald equation in the   estimation of LDL-C.  Cresenciano Genre et al. Annamaria Helling. 2706;237(62): 2061-2068  (http://education.[facility].com/faq/FAQ164)    TRIG 500 (H) 12/14/2018   CHOLHDL 3.8 12/14/2018    COPD Patient takes trelegy daily and albuterol PRN. States with the allergies has been needing her albuterol more often. For allergies she takes cetirizine daily  Smokes 1 ppd. States she is ready to quit and wants to quit smoking cold [place]. Son said he would help remodel her back porch if she quit.  Denies shortness of breath or wheezings  Depression & Anxiety Patient takes wellbutrin and buspar 15mg  once a day but sometimes she takes 2.   PHQ2/9: Depression screen [facility] 2/9 12/24/2018 11/30/2018 05/08/2018 05/08/2018 03/06/2018  Decreased Interest 0 0 0 0 0  Down, Depressed, Hopeless 0 0 0 0 1  PHQ - 2 Score 0 0 0 0 1  Altered sleeping 1 0 0 - 0  Tired, decreased energy 0 0 0 - 0  Change in appetite 0 0 0 - 0  Feeling bad or failure about yourself  0 0 0 - 0  Trouble concentrating 0 0 0 - 1  Moving slowly or fidgety/restless 0 0 0 - 0  Suicidal thoughts 0 0 0 - 0  PHQ-9 Score 1 0 0 - 2  Difficult doing work/chores Not difficult at all Not difficult at all - - Not difficult at all     PHQ reviewed. Negative  Patient Active Problem List   Diagnosis Date Noted  . Tobacco abuse 12/01/2018  .  Preventative health care 05/15/2018  . Emphysema lung ([place]) 12/29/2017  . Aortic atherosclerosis ([place]) 12/29/2017  . Hepatic steatosis 12/29/2017  . COPD (chronic obstructive pulmonary disease) ([place]) 12/05/2017  . Degenerative disc disease, cervical 09/04/2017  . Cervical arthritis 09/04/2017  . Muscle spasm of left shoulder area 09/03/2017  . Hypertriglyceridemia 07/22/2017  . Elevated platelet count 04/11/2017  . Marijuana use 03/17/2017  . Prediabetes 03/14/2017  . Essential hypertension, benign 03/14/2017  . Hyperlipidemia 03/14/2017  . Medication monitoring encounter 03/14/2017  . Anxiety disorder 03/14/2017     Past Medical History:  Diagnosis Date  . Anxiety   . Aortic atherosclerosis ([place]) 12/29/2017   Chest CT March 2019  . Cervical arthritis 09/04/2017   xrays Dec 2018  . Degenerative disc disease, cervical 09/04/2017   Xrays Dec 2018  . Depression   . Emphysema lung ([place]) 12/29/2017   Chest CT March 2019  . Hepatic steatosis 12/29/2017   Chest CT March 2019  . Hyperlipidemia   . Hypertension   . Prediabetes 03/14/2017    No past surgical history on file.  Social History   Tobacco Use  . Smoking status: Current Every Day Smoker    Packs/day: 0.50    Years: 44.00    Pack years: 22.00    Types: Cigarettes    Start date: 08/30/2018  . Smokeless tobacco: Never Used  Substance Use Topics  . Alcohol use: Yes    Alcohol/week: 5.0 standard drinks    Types: 1 Glasses of wine, 4 Cans of beer per week     Current Outpatient Medications:  .  aspirin EC 81 MG tablet, Take 1 tablet (81 mg total) by mouth daily., Disp: , Rfl:  .  atorvastatin (LIPITOR) 40 MG tablet, Take 40 mg by mouth at bedtime., Disp: , Rfl:  .  buPROPion (WELLBUTRIN XL) 300 MG 24 hr tablet, Take 1 tablet by mouth once daily, Disp: 30 tablet, Rfl: 0 .  busPIRone (BUSPAR) 15 MG tablet, Take 1 tablet by mouth twice daily, Disp: 60 tablet, Rfl: 0 .  calcium-vitamin D (OSCAL WITH D) 250-125 MG-UNIT tablet, Take 1 tablet by mouth daily., Disp: , Rfl:  .  cetirizine (ZYRTEC) 10 MG tablet, Take 10 mg by mouth daily., Disp: , Rfl:  .  fenofibrate (TRICOR) 48 MG tablet, Take 1 tablet (48 mg total) by mouth daily., Disp: 30 tablet, Rfl: 1 .  Fluticasone-Umeclidin-Vilant (TRELEGY ELLIPTA) 100-62.5-25 MCG/INH AEPB, Inhale 1 each into the lungs daily. (this replaces BREO), Disp: 1 each, Rfl: 12 .  ibuprofen (ADVIL) 600 MG tablet, TAKE 1 TABLET BY MOUTH THREE TIMES DAILY, Disp: 30 tablet, Rfl: 0 .  Icosapent Ethyl (VASCEPA) 1 g CAPS, Take 2 capsules (2 g total) by mouth 2 (two) times daily., Disp: 360 capsule, Rfl: 3 .  ketorolac  (TORADOL) 10 MG tablet, Take 1 tablet (10 mg total) by mouth every 6 (six) hours as needed. Do not start until finished with prednisone, Disp: 20 tablet, Rfl: 0 .  lisinopril (PRINIVIL,ZESTRIL) 5 MG tablet, Take 1 tablet by mouth once daily, Disp: 90 tablet, Rfl: 1 .  metFORMIN (GLUCOPHAGE) 1000 MG tablet, TAKE 1 TABLET BY MOUTH TWICE DAILY WITH A MEAL, Disp: 60 tablet, Rfl: 0 .  methocarbamol (ROBAXIN) 500 MG tablet, TAKE 1 TABLET BY MOUTH EVERY 6 HOURS AS NEEDED FOR MUSCLE SPASM, Disp: 30 tablet, Rfl: 0 .  Multiple Vitamins-Minerals (MULTI COMPLETE PO), Take 1,000 Units by mouth daily., Disp: , Rfl:  .  nystatin (MYCOSTATIN/NYSTOP) powder,  Apply topically 3 (three) times daily as needed., Disp: 15 g, Rfl: 2 .  pravastatin (PRAVACHOL) 40 MG tablet, Take 1 tablet (40 mg total) by mouth daily. For cholesterol; this replaces atorvastatin (Patient not taking: Reported on 12/24/2018), Disp: 30 tablet, Rfl: 1 .  predniSONE (STERAPRED UNI-PAK 21 TAB) 10 MG (21) TBPK tablet, Take 6 tablets on the first day and decrease by 1 tablet each day until finished., Disp: 21 tablet, Rfl: 0 .  PROAIR HFA 108 (90 Base) MCG/ACT inhaler, INHALE 2 PUFFS BY MOUTH EVERY 6 HOURS AS NEEDED FOR WHEEZING FOR SHORTNESS OF BREATH, Disp: 9 g, Rfl: 0 .  traMADol (ULTRAM) 50 MG tablet, Take 1 tablet (50 mg total) by mouth every 6 (six) hours as needed., Disp: 12 tablet, Rfl: 0 .  Turmeric 500 MG CAPS, Take 500 mg by mouth daily., Disp: , Rfl:   No Known Allergies  Review of Systems  Constitutional: Negative for chills, fever and malaise/fatigue.  HENT: Negative for congestion, sinus pain and sore throat.   Eyes: Negative for blurred vision.  Respiratory: Positive for shortness of breath (resolves with albuterol PRN). Negative for cough.   Cardiovascular: Negative for chest pain, palpitations and leg swelling.  Gastrointestinal: Negative for abdominal pain, blood in stool, constipation, diarrhea and nausea.  Genitourinary:  Negative for dysuria and hematuria.  Musculoskeletal: Negative for falls and joint pain.  Skin: Negative for rash.  Neurological: Negative for dizziness, tingling and headaches.  Endo/Heme/Allergies: Negative for polydipsia.  Psychiatric/Behavioral: Negative for depression. The patient is not nervous/anxious.       No other specific complaints in a complete review of systems (except as listed in HPI above).  Objective  Vitals:   03/24/19 0900  BP: 120/78  Pulse: 90  Resp: 14  Temp: 98 F (36.7 C)  TempSrc: Oral  SpO2: 96%  Weight: 149 lb 8 oz (67.8 kg)  Height: 5\' 2"  (1.575 m)     Body mass index is 27.34 kg/m.  Nursing Note and Vital Signs reviewed.  Physical Exam Vitals signs reviewed.  Constitutional:      Appearance: She is well-developed.  HENT:     Head: Normocephalic and atraumatic.     Right Ear: Tympanic membrane, ear canal and external ear normal. There is no impacted cerumen.     Left Ear: Tympanic membrane, ear canal and external ear normal. There is no impacted cerumen.  Neck:     Musculoskeletal: Normal range of motion and neck supple.     Vascular: No carotid bruit.  Cardiovascular:     Heart sounds: Normal heart sounds.  Pulmonary:     Effort: Pulmonary effort is normal.     Breath sounds: Normal breath sounds.  Abdominal:     General: Bowel sounds are normal.     Palpations: Abdomen is soft.     Tenderness: There is no abdominal tenderness.  Musculoskeletal: Normal range of motion.  Skin:    General: Skin is warm and dry.     Capillary Refill: Capillary refill takes less than 2 seconds.  Neurological:     Mental Status: She is alert and oriented to person, place, and time.     GCS: GCS eye subscore is 4. GCS verbal subscore is 5. GCS motor subscore is 6.     Sensory: No sensory deficit.  Psychiatric:        Speech: Speech normal.        Behavior: Behavior normal.        Thought  Content: Thought content normal.        Judgment: Judgment  normal.        No results found for this or any previous visit (from the past 48 hour(s)).  Assessment & Plan  1. Essential hypertension, benign stable - lisinopril (ZESTRIL) 5 MG tablet; Take 1 tablet (5 mg total) by mouth daily.  Dispense: 90 tablet; Refill: 1  2. Aortic atherosclerosis (HCC) - Icosapent Ethyl (VASCEPA) 1 g CAPS; Take 2 capsules (2 g total) by mouth 2 (two) times daily.  Dispense: 360 capsule; Refill: 3 - atorvastatin (LIPITOR) 40 MG tablet; Take 1 tablet (40 mg total) by mouth at bedtime.  Dispense: 90 tablet; Refill: 1 - Lipid Profile  3. Chronic obstructive pulmonary disease, unspecified COPD type ([place]) Discussed if using albuterol frequently can refer to pulm. Would like to hold off, recc quit smoking - Fluticasone-Umeclidin-Vilant (TRELEGY ELLIPTA) 100-62.5-25 MCG/INH AEPB; Inhale 1 each into the lungs daily.  Dispense: 1 each; Refill: 12  4. Hepatic steatosis Diet  5. Prediabetes - metFORMIN (GLUCOPHAGE) 1000 MG tablet; Take 1 tablet (1,000 mg total) by mouth 2 (two) times daily with a meal.  Dispense: 180 tablet; Refill: 1  6. Mixed hyperlipidemia vascepa coupon to be given, if she cannot afford will call us and will send to CCM pharmacist to assist - Lipid Profile  7. Generalized anxiety disorder - busPIRone (BUSPAR) 15 MG tablet; Take 1 tablet (15 mg total) by mouth 2 (two) times daily.  Dispense: 180 tablet; Refill: 1  8. Other specified anxiety disorders - buPROPion (WELLBUTRIN XL) 300 MG 24 hr tablet; Take 1 tablet (300 mg total) by mouth daily.  Dispense: 90 tablet; Refill: 1  9. History of tobacco use, presenting hazards to health Spent greater than 3 minutes discussing this  - buPROPion (WELLBUTRIN XL) 300 MG 24 hr tablet; Take 1 tablet (300 mg total) by mouth daily.  Dispense: 90 tablet; Refill: 1  10. Medication monitoring encounter - Hepatic function panel  -Red flags and when to present for emergency care or RTC including fever  >101.72F, chest pain, shortness of breath, new/worsening/un-resolving symptoms,  reviewed with patient at time of visit. Follow up and care instructions discussed and provided in AVS.

## 2019-03-25 ENCOUNTER — Other Ambulatory Visit: Payer: Self-pay | Admitting: Nurse Practitioner

## 2019-03-25 DIAGNOSIS — I700 Atherosclerosis of aorta: Secondary | ICD-10-CM

## 2019-03-25 MED ORDER — ATORVASTATIN CALCIUM 80 MG PO TABS: 80.0000 mg | ORAL_TABLET | Freq: Every day | ORAL | 1 refills | Status: DC

## 2019-03-26 ENCOUNTER — Ambulatory Visit: Payer: Self-pay | Admitting: *Deleted

## 2019-03-26 NOTE — Chronic Care Management (AMB) (Signed)
   Chronic Care Management   Unsuccessful Call Note 03/26/2019 Name: Savannah Washington MRN: [id number] DOB: 1961-04-14  Patient  is a 58 year old female who sees Suezanne Cheshire, NP for primary care.  Suezanne Cheshire asked the CCM team to consult the patient for medication assistance. Referral was placed on 03/24/19. Patient's last office visit was 03/24/19.     Was unable to reach patient via telephone today to discuss CCM services and obtain cosent. I have left HIPAA compliant voicemail asking patient to return my call. (unsuccessful outreach #1).   Plan: Will follow-up within 7 business days via telephone.     Elliot Gurney, [place] Administrator, [facility] Care Management [phone]

## 2019-04-08 ENCOUNTER — Ambulatory Visit: Payer: Self-pay

## 2019-04-08 DIAGNOSIS — E782 Mixed hyperlipidemia: Secondary | ICD-10-CM

## 2019-04-08 NOTE — Chronic Care Management (AMB) (Signed)
  Care Management   Note  04/08/2019 Name: Savannah Washington MRN: [id number] DOB: [birth year]  Savannah Washington is a 58 year old femal who sees [facility] for primary care. Isaias Cowman asked the CCM team to consult the patient for medication assistance as her [name] is too costly (see referral notes). Patient has a history of but not limited to COPD, Tobacco abuse, HTN, Hyperlipidemia. Referral was placed 03/24/2019 during last office visit.  Telephone outreach to patient today to introduce CCM services.  Savannah Washington was given information about Care Management services today including:  1. Case Management services include personalized support from designated clinical staff supervised by a physician, including individualized plan of care and coordination with other care providers 2. 24/7 contact phone numbers for assistance for urgent and routine care needs. 3. The patient may stop CCM services at any time (effective at the end of the month) by phone call to the office staff.   Patient agreed to services and verbal consent obtained.     Plan: Patient has been placed on [place] schedule for next week for telephone follow up    [place]. Rollene Rotunda, RN, BSN Nurse Care Coordinator [facility] Care Management  [phone]

## 2019-04-14 ENCOUNTER — Ambulatory Visit: Payer: Self-pay | Admitting: Pharmacist

## 2019-04-14 DIAGNOSIS — E782 Mixed hyperlipidemia: Secondary | ICD-10-CM

## 2019-04-14 NOTE — Chronic Care Management (AMB) (Signed)
  Care Management   Note  04/14/2019 Name: Savannah Washington MRN: [id number] DOB: 1960-12-05   Subjective: Savannah Washington is 58 year old patient seen by Suezanne Cheshire, NP on 03/24/19. CM clinical pharmacist consulted for medication assistance for Vascepa. Patient was provided with Vasecpa coupon at 03/24/19 office visit. Successful telephone outreach with patient today, HIPAA identifiers verified.  Assessment: Patient states that she does have Vascepa, she sometimes has difficulty early in the year with medication affordability early in the year due to the deductible on her insurance plan. She has Vascepa and has been taking it. No affordability issues, used coupon.   Plan and Follow up: No further follow up required: patient is able to afford medications now that deductible on her commercial insurance plan has been met. The care management team is available to follow up with the patient after provider conversation with the patient regarding recommendation for care management engagement and subsequent re-referral to the care management team.   Ruben Reason, PharmD Clinical Pharmacist [place] [phone]

## 2019-04-28 ENCOUNTER — Other Ambulatory Visit: Payer: Self-pay | Admitting: Nurse Practitioner

## 2019-06-01 ENCOUNTER — Encounter: Payer: Self-pay | Admitting: Family Medicine

## 2019-06-01 ENCOUNTER — Other Ambulatory Visit: Payer: Self-pay | Admitting: Nurse Practitioner

## 2019-06-01 MED ORDER — PROAIR HFA 108 (90 BASE) MCG/ACT IN AERS: INHALATION_SPRAY | RESPIRATORY_TRACT | 1 refills | Status: DC

## 2019-07-06 ENCOUNTER — Encounter: Payer: Self-pay | Admitting: Family Medicine

## 2019-07-06 DIAGNOSIS — Z1231 Encounter for screening mammogram for malignant neoplasm of breast: Secondary | ICD-10-CM

## 2019-07-16 ENCOUNTER — Other Ambulatory Visit: Payer: Self-pay

## 2019-07-16 ENCOUNTER — Ambulatory Visit (INDEPENDENT_AMBULATORY_CARE_PROVIDER_SITE_OTHER): Payer: BLUE CROSS/BLUE SHIELD

## 2019-07-16 DIAGNOSIS — Z23 Encounter for immunization: Secondary | ICD-10-CM | POA: Diagnosis not present

## 2019-08-09 ENCOUNTER — Ambulatory Visit: Admission: RE | Admit: 2019-08-09 | Payer: BLUE CROSS/BLUE SHIELD | Source: Ambulatory Visit

## 2019-08-09 ENCOUNTER — Other Ambulatory Visit: Payer: Self-pay

## 2019-08-20 ENCOUNTER — Encounter: Payer: Self-pay | Admitting: Emergency Medicine

## 2019-08-20 ENCOUNTER — Other Ambulatory Visit: Payer: Self-pay

## 2019-08-20 ENCOUNTER — Emergency Department
Admission: EM | Admit: 2019-08-20 | Discharge: 2019-08-20 | Disposition: A | Discharge status: Home / Self Care | Payer: BLUE CROSS/BLUE SHIELD | Attending: Emergency Medicine | Admitting: Emergency Medicine

## 2019-08-20 ENCOUNTER — Emergency Department: Payer: BLUE CROSS/BLUE SHIELD

## 2019-08-20 DIAGNOSIS — X500XXA Overexertion from strenuous movement or load, initial encounter: Secondary | ICD-10-CM | POA: Insufficient documentation

## 2019-08-20 DIAGNOSIS — Y93F2 Activity, caregiving, lifting: Secondary | ICD-10-CM | POA: Insufficient documentation

## 2019-08-20 DIAGNOSIS — S299XXA Unspecified injury of thorax, initial encounter: Secondary | ICD-10-CM | POA: Diagnosis present

## 2019-08-20 DIAGNOSIS — F17210 Nicotine dependence, cigarettes, uncomplicated: Secondary | ICD-10-CM | POA: Diagnosis not present

## 2019-08-20 DIAGNOSIS — S20212A Contusion of left front wall of thorax, initial encounter: Secondary | ICD-10-CM | POA: Insufficient documentation

## 2019-08-20 DIAGNOSIS — Z79899 Other long term (current) drug therapy: Secondary | ICD-10-CM | POA: Diagnosis not present

## 2019-08-20 DIAGNOSIS — Z7982 Long term (current) use of aspirin: Secondary | ICD-10-CM | POA: Diagnosis not present

## 2019-08-20 DIAGNOSIS — Y929 Unspecified place or not applicable: Secondary | ICD-10-CM | POA: Insufficient documentation

## 2019-08-20 DIAGNOSIS — I10 Essential (primary) hypertension: Secondary | ICD-10-CM | POA: Insufficient documentation

## 2019-08-20 DIAGNOSIS — J449 Chronic obstructive pulmonary disease, unspecified: Secondary | ICD-10-CM | POA: Insufficient documentation

## 2019-08-20 DIAGNOSIS — Y999 Unspecified external cause status: Secondary | ICD-10-CM | POA: Diagnosis not present

## 2019-08-20 MED ORDER — TRAMADOL HCL 50 MG PO TABS: 50.0000 mg | ORAL_TABLET | Freq: Four times a day (QID) | ORAL | 0 refills | Status: DC | PRN

## 2019-08-20 MED ORDER — LIDOCAINE 5 % EX PTCH
1.0000 | MEDICATED_PATCH | CUTANEOUS | Status: DC
  Administered 2019-08-20: 1 via TRANSDERMAL
  Filled 2019-08-20: qty 1

## 2019-08-20 NOTE — ED Triage Notes (Signed)
Pt here with c/o left rib pain, COPD, smoker, sats normally 96% on RA, however, today, pt states painful to breathe deep, grandson picked her up last night to "hug" her and squeezed too tight, pt heard a few pops and has been in pain since. NAD.
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
## 2019-08-30 ENCOUNTER — Encounter: Payer: Self-pay | Admitting: Family Medicine

## 2019-08-31 ENCOUNTER — Other Ambulatory Visit: Payer: Self-pay

## 2019-08-31 ENCOUNTER — Ambulatory Visit
Admission: RE | Admit: 2019-08-31 | Discharge: 2019-08-31 | Disposition: A | Discharge status: Home / Self Care | Payer: BLUE CROSS/BLUE SHIELD | Source: Ambulatory Visit | Attending: Family Medicine | Admitting: Family Medicine

## 2019-08-31 ENCOUNTER — Encounter: Payer: Self-pay | Admitting: Family Medicine

## 2019-08-31 ENCOUNTER — Ambulatory Visit (INDEPENDENT_AMBULATORY_CARE_PROVIDER_SITE_OTHER): Payer: BLUE CROSS/BLUE SHIELD | Admitting: Family Medicine

## 2019-08-31 ENCOUNTER — Ambulatory Visit
Admission: RE | Admit: 2019-08-31 | Discharge: 2019-08-31 | Disposition: A | Discharge status: Home / Self Care | Payer: BLUE CROSS/BLUE SHIELD | Attending: Family Medicine | Admitting: Family Medicine

## 2019-08-31 VITALS — BP 122/82 | HR 87 | Temp 97.8°F | Resp 14 | Ht 62.0 in | Wt 142.1 lb

## 2019-08-31 DIAGNOSIS — R0789 Other chest pain: Secondary | ICD-10-CM | POA: Insufficient documentation

## 2019-08-31 DIAGNOSIS — S20212D Contusion of left front wall of thorax, subsequent encounter: Secondary | ICD-10-CM | POA: Diagnosis not present

## 2019-08-31 DIAGNOSIS — J449 Chronic obstructive pulmonary disease, unspecified: Secondary | ICD-10-CM | POA: Diagnosis not present

## 2019-08-31 DIAGNOSIS — S298XXD Other specified injuries of thorax, subsequent encounter: Secondary | ICD-10-CM
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
## 2019-09-01 ENCOUNTER — Encounter: Payer: Self-pay | Admitting: Family Medicine

## 2019-09-01 ENCOUNTER — Telehealth: Payer: Self-pay | Admitting: Family Medicine

## 2019-09-01 DIAGNOSIS — S20212D Contusion of left front wall of thorax, subsequent encounter: Secondary | ICD-10-CM

## 2019-09-01 DIAGNOSIS — R0789 Other chest pain: Secondary | ICD-10-CM

## 2019-09-01 MED ORDER — TRAMADOL HCL 50 MG PO TABS: 50.0000 mg | ORAL_TABLET | Freq: Three times a day (TID) | ORAL | 0 refills | Status: AC | PRN

## 2019-09-01 MED ORDER — PREDNISONE 20 MG PO TABS: 40.0000 mg | ORAL_TABLET | Freq: Every day | ORAL | 0 refills | Status: AC

## 2019-09-01 NOTE — Telephone Encounter (Signed)
Pt calling to find out what medication she is supposed to be taking and if it will be called in to the pharmacy today.

## 2019-09-01 NOTE — Telephone Encounter (Signed)
Patient states that a medication was supposed to be sent yesterday after her appointment. She was advised by pharmacy that they have not yet received RX.

## 2019-09-02 NOTE — Telephone Encounter (Signed)
-----   Message from [name], [place] sent at 09/02/2019  4:32 PM EST ----- Sorry your last note didn't come back to me I saw it when I was closing charts

## 2019-09-06 ENCOUNTER — Ambulatory Visit: Payer: BLUE CROSS/BLUE SHIELD | Admitting: Family Medicine

## 2019-09-08 ENCOUNTER — Other Ambulatory Visit: Payer: Self-pay | Admitting: Family Medicine

## 2019-09-08 DIAGNOSIS — M5416 Radiculopathy, lumbar region: Secondary | ICD-10-CM

## 2019-09-27 ENCOUNTER — Other Ambulatory Visit: Payer: Self-pay

## 2019-09-27 ENCOUNTER — Other Ambulatory Visit: Payer: Self-pay | Admitting: Family Medicine

## 2019-09-27 DIAGNOSIS — F418 Other specified anxiety disorders: Secondary | ICD-10-CM

## 2019-09-27 DIAGNOSIS — F411 Generalized anxiety disorder: Secondary | ICD-10-CM

## 2019-09-27 DIAGNOSIS — Z87891 Personal history of nicotine dependence: Secondary | ICD-10-CM

## 2019-09-27 DIAGNOSIS — M5416 Radiculopathy, lumbar region: Secondary | ICD-10-CM

## 2019-09-27 DIAGNOSIS — I700 Atherosclerosis of aorta: Secondary | ICD-10-CM

## 2019-09-27 MED ORDER — BUSPIRONE HCL 15 MG PO TABS: 15.0000 mg | ORAL_TABLET | Freq: Two times a day (BID) | ORAL | 1 refills | Status: DC

## 2019-09-27 MED ORDER — BUPROPION HCL ER (XL) 300 MG PO TB24: 300.0000 mg | ORAL_TABLET | Freq: Every day | ORAL | 1 refills | Status: DC

## 2019-09-27 MED ORDER — ATORVASTATIN CALCIUM 80 MG PO TABS: 80.0000 mg | ORAL_TABLET | Freq: Every day | ORAL | 1 refills | Status: DC

## 2019-09-29 ENCOUNTER — Telehealth: Payer: Self-pay

## 2019-09-29 MED ORDER — ALBUTEROL SULFATE HFA 108 (90 BASE) MCG/ACT IN AERS: 2.0000 | INHALATION_SPRAY | RESPIRATORY_TRACT | 5 refills | Status: DC | PRN

## 2019-09-29 NOTE — Telephone Encounter (Signed)
new rx per ins

## 2019-09-30 ENCOUNTER — Encounter: Payer: Self-pay | Admitting: Family Medicine

## 2019-09-30 ENCOUNTER — Ambulatory Visit: Payer: BLUE CROSS/BLUE SHIELD | Attending: Internal Medicine

## 2019-09-30 DIAGNOSIS — Z20822 Contact with and (suspected) exposure to covid-19: Secondary | ICD-10-CM

## 2019-10-04 ENCOUNTER — Other Ambulatory Visit: Payer: Self-pay

## 2019-10-04 ENCOUNTER — Telehealth: Payer: Self-pay

## 2019-10-04 DIAGNOSIS — R7303 Prediabetes: Secondary | ICD-10-CM

## 2019-10-04 MED ORDER — METFORMIN HCL 1000 MG PO TABS: 1000.0000 mg | ORAL_TABLET | Freq: Two times a day (BID) | ORAL | 1 refills | Status: DC

## 2019-10-04 NOTE — Telephone Encounter (Signed)
Pt called to get covid test results. Results still pending. Encouraged her to check mychart for update. Voices understanding.   [place]

## 2019-10-06 ENCOUNTER — Encounter: Payer: Self-pay | Admitting: Family Medicine

## 2019-10-06 ENCOUNTER — Ambulatory Visit (INDEPENDENT_AMBULATORY_CARE_PROVIDER_SITE_OTHER): Payer: BLUE CROSS/BLUE SHIELD | Admitting: Family Medicine

## 2019-10-06 VITALS — Ht 62.0 in | Wt 145.0 lb

## 2019-10-06 DIAGNOSIS — J069 Acute upper respiratory infection, unspecified: Secondary | ICD-10-CM | POA: Diagnosis not present

## 2019-10-06 DIAGNOSIS — J441 Chronic obstructive pulmonary disease with (acute) exacerbation: Secondary | ICD-10-CM

## 2019-10-06 LAB — NOVEL CORONAVIRUS, NAA

## 2019-10-06 MED ORDER — ZYRTEC ALLERGY 10 MG PO TBDP: 10.0000 mg | ORAL_TABLET | Freq: Every day | ORAL | 0 refills | Status: DC

## 2019-10-06 MED ORDER — IPRATROPIUM-ALBUTEROL 0.5-2.5 (3) MG/3ML IN SOLN: 3.0000 mL | Freq: Three times a day (TID) | RESPIRATORY_TRACT | 1 refills | Status: DC | PRN

## 2019-10-06 MED ORDER — BENZONATATE 100 MG PO CAPS: 100.0000 mg | ORAL_CAPSULE | Freq: Three times a day (TID) | ORAL | 1 refills | Status: DC | PRN

## 2019-10-06 MED ORDER — AZITHROMYCIN 250 MG PO TABS: 250.0000 mg | ORAL_TABLET | Freq: Every day | ORAL | 0 refills | Status: DC

## 2019-10-06 MED ORDER — PREDNISONE 20 MG PO TABS: 40.0000 mg | ORAL_TABLET | Freq: Every day | ORAL | 0 refills | Status: AC

## 2019-10-06 MED ORDER — GUAIFENESIN ER 600 MG PO TB12: 600.0000 mg | ORAL_TABLET | Freq: Two times a day (BID) | ORAL | 0 refills | Status: AC

## 2019-10-06 NOTE — Progress Notes (Signed)
Name: Savannah Washington   MRN: [id number]    DOB: 02/08/1961   Date:10/06/2019       Progress Note  Subjective:    Chief Complaint  Chief Complaint  Patient presents with  . URI    onset 2 weeks, symptoms include: runny nose, cough. Pt already awaiting covid results    I connected with  Dorthula Perfect on 10/06/19 at  8:20 AM EST by telephone and verified that I am speaking with the correct person using two identifiers.   I discussed the limitations, risks, security and privacy concerns of performing an evaluation and management service by telephone and the availability of in person appointments. Staff also discussed with the patient that there may be a patient responsible charge related to this service. Patient Location: home Provider Location: [facility] Additional Individuals present: none  HPI Patient presents with URI symptoms that started about 2 weeks ago. Her symptoms consist of runny nose, congestion, cough and wheeze. Nasal discharge has been mostly clear, her cough is mostly nonproductive but associated with wheeze and shortness of breath, does feel consistent with past COPD exacerbation. She did get tested for Covid about 4 days after her symptoms started, her testing was done on 1/31 about 7 days ago and her test results are still pending. She had no known sick contacts prior to onset of symptoms. She has been isolating at home ever since her symptoms started roughly a week and a half ago.  She denies any fever, sweats, chest pain, exertional chest pain dyspnea or near syncope, confusion.   Patient Active Problem List   Diagnosis Date Noted  . Tobacco abuse 12/01/2018  . Aortic atherosclerosis ([place]) 12/29/2017  . Hepatic steatosis 12/29/2017  . COPD (chronic obstructive pulmonary disease) ([place]) 12/05/2017  . Degenerative disc disease, cervical 09/04/2017  . Cervical arthritis 09/04/2017  . Muscle spasm of left shoulder area 09/03/2017  . Elevated platelet count  04/11/2017  . Marijuana use 03/17/2017  . Prediabetes 03/14/2017  . Essential hypertension, benign 03/14/2017  . Hyperlipidemia 03/14/2017  . Anxiety disorder 03/14/2017    Social History   Tobacco Use  . Smoking status: Current Every Day Smoker    Packs/day: 0.50    Years: 44.00    Pack years: 22.00    Types: Cigarettes    Start date: 08/30/2018  . Smokeless tobacco: Never Used  Substance Use Topics  . Alcohol use: Yes    Alcohol/week: 5.0 standard drinks    Types: 1 Glasses of wine, 4 Cans of beer per week     Current Outpatient Medications:  .  albuterol (VENTOLIN HFA) 108 (90 Base) MCG/ACT inhaler, Inhale 2 puffs into the lungs every 4 (four) hours as needed for wheezing or shortness of breath., Disp: 18 g, Rfl: 5 .  aspirin EC 81 MG tablet, Take 1 tablet (81 mg total) by mouth daily., Disp: , Rfl:  .  atorvastatin (LIPITOR) 80 MG tablet, Take 1 tablet (80 mg total) by mouth at bedtime., Disp: 90 tablet, Rfl: 1 .  buPROPion (WELLBUTRIN XL) 300 MG 24 hr tablet, Take 1 tablet (300 mg total) by mouth daily., Disp: 90 tablet, Rfl: 1 .  busPIRone (BUSPAR) 15 MG tablet, Take 1 tablet (15 mg total) by mouth 2 (two) times daily., Disp: 180 tablet, Rfl: 1 .  calcium-vitamin D (OSCAL WITH D) 250-125 MG-UNIT tablet, Take 1 tablet by mouth daily., Disp: , Rfl:  .  cetirizine (ZYRTEC) 10 MG tablet, Take 10 mg by  mouth daily., Disp: , Rfl:  .  Fluticasone-Umeclidin-Vilant (TRELEGY ELLIPTA) 100-62.5-25 MCG/INH AEPB, Inhale 1 each into the lungs daily., Disp: 1 each, Rfl: 12 .  ibuprofen (ADVIL) 600 MG tablet, TAKE 1 TABLET BY MOUTH THREE TIMES DAILY, Disp: 30 tablet, Rfl: 0 .  Icosapent Ethyl (VASCEPA) 1 g CAPS, Take 2 capsules (2 g total) by mouth 2 (two) times daily., Disp: 360 capsule, Rfl: 3 .  lisinopril (ZESTRIL) 5 MG tablet, Take 1 tablet (5 mg total) by mouth daily., Disp: 90 tablet, Rfl: 1 .  metFORMIN (GLUCOPHAGE) 1000 MG tablet, Take 1 tablet (1,000 mg total) by mouth 2 (two)  times daily with a meal., Disp: 180 tablet, Rfl: 1 .  methocarbamol (ROBAXIN) 500 MG tablet, TAKE 1 TABLET BY MOUTH EVERY 6 HOURS AS NEEDED FOR MUSCLE SPASM, Disp: 30 tablet, Rfl: 0 .  Multiple Vitamins-Minerals (MULTI COMPLETE PO), Take 1,000 Units by mouth daily., Disp: , Rfl:  .  nystatin (MYCOSTATIN/NYSTOP) powder, Apply topically 3 (three) times daily as needed., Disp: 15 g, Rfl: 2 .  Turmeric 500 MG CAPS, Take 500 mg by mouth daily., Disp: , Rfl:   No Known Allergies I personally reviewed active problem list, medication list, allergies, family history, social history, health maintenance, notes from last encounter, lab results, imaging with the patient/caregiver today. Review of Systems  Constitutional: Negative.   HENT: Negative.   Eyes: Negative.   Respiratory: Negative.   Cardiovascular: Negative.   Gastrointestinal: Negative.   Endocrine: Negative.   Genitourinary: Negative.   Musculoskeletal: Negative.   Skin: Negative.   Allergic/Immunologic: Negative.   Neurological: Negative.   Hematological: Negative.   Psychiatric/Behavioral: Negative.   All other systems reviewed and are negative.    Objective:    Virtual encounter, vitals limited, only able to obtain the following Today's Vitals   10/06/19 0810  Weight: 145 lb (65.8 kg)  Height: 5\' 2"  (1.575 m)   Body mass index is 26.52 kg/m. Nursing Note and Vital Signs reviewed.  Physical Exam Vitals and nursing note reviewed.  Pulmonary:     Comments: Frequent coughing no audible wheeze or stridor Neurological:     Mental Status: She is alert.     PE limited by telephone encounter  No results found for this or any previous visit (from the past 72 hour(s)).  Assessment and Plan:     ICD-10-CM   1. COPD with acute exacerbation (HCC)  J44.1 predniSONE (DELTASONE) 20 MG tablet    azithromycin (ZITHROMAX Z-PAK) 250 MG tablet    ipratropium-albuterol (DUONEB) 0.5-2.5 (3) MG/3ML SOLN    benzonatate (TESSALON)  100 MG capsule    guaiFENesin (MUCINEX) 600 MG 12 hr tablet    Cetirizine HCl (ZYRTEC ALLERGY) 10 MG TBDP   unclear etiology re covid vs other virus -steroid burst, nebs, Mucinex and Z-Pak, red flags reviewed, close follow-up indicated  2. Viral upper respiratory tract infection  J06.9 benzonatate (TESSALON) 100 MG capsule    guaiFENesin (MUCINEX) 600 MG 12 hr tablet    Cetirizine HCl (ZYRTEC ALLERGY) 10 MG TBDP   covid test pending - cont to isolate, tx supportive and sx - add antihistamines, nebs, cough meds, push fluids, rest, etc      -Red flags and when to present for emergency care or RTC including fever >101.54F, chest pain, shortness of breath, new/worsening/un-resolving symptoms,  reviewed with patient at time of visit. Follow up and care instructions discussed and provided in AVS. - I discussed the assessment and treatment plan with  the patient. The patient was provided an opportunity to ask questions and all were answered. The patient agreed with the plan and demonstrated an understanding of the instructions.  - The patient was advised to call back or seek an in-person evaluation if the symptoms worsen or if the condition fails to improve as anticipated.  I provided 12 minutes of non-face-to-face time during this encounter.  [name], PA-C 10/06/19 8:49 AM

## 2019-12-22 ENCOUNTER — Other Ambulatory Visit: Payer: Self-pay | Admitting: Family Medicine

## 2019-12-22 DIAGNOSIS — M5416 Radiculopathy, lumbar region: Secondary | ICD-10-CM

## 2019-12-22 NOTE — Telephone Encounter (Signed)
Refill request for general medication.  Last office [id number]  No follow-ups on file.

## 2019-12-22 NOTE — Telephone Encounter (Signed)
Requested medication (s) are due for refill today: yes  Requested medication (s) are on the active medication list:yes  Last refill:  09/27/19  Future visit scheduled: No  Notes to clinic:  Labs overdue    Requested Prescriptions  Pending Prescriptions Disp Refills   ibuprofen (ADVIL) 600 MG tablet [Pharmacy Med Name: Ibuprofen 600 MG Oral Tablet] 30 tablet 0    Sig: TAKE 1 TABLET BY MOUTH THREE TIMES DAILY      Analgesics:  NSAIDS Failed - 12/22/2019 11:38 AM      Failed - Cr in normal range and within 360 days    Creat  Date Value Ref Range Status  12/14/2018 0.84 0.50 - 1.05 mg/dL Final    Comment:    For patients >34 years of age, the reference limit for Creatinine is approximately 13% higher for people identified as African-American. .           Failed - HGB in normal range and within 360 days    Hemoglobin  Date Value Ref Range Status  12/14/2018 14.2 11.7 - 15.5 g/dL Final   HGB  Date Value Ref Range Status  11/25/2011 14.1 12.0 - 16.0 g/dL Final          Passed - Patient is not pregnant      Passed - Valid encounter within last 12 months    Recent Outpatient Visits           2 months ago COPD with acute exacerbation [facility])   [facility] [place], Kristeen Miss, PA-C   3 months ago Left-sided chest wall pain   [facility] [name], PA-C   9 months ago Essential hypertension, benign   [place], NP   12 months ago Left lumbar radiculitis   [facility] Steele Sizer, MD   1 year ago Essential hypertension, benign   [place], Satira Anis, MD

## 2019-12-29 ENCOUNTER — Telehealth: Payer: Self-pay

## 2019-12-29 NOTE — Telephone Encounter (Signed)
Pharmacy sent a message about changing her inhaler to generic alternative?

## 2020-01-21 ENCOUNTER — Telehealth: Payer: Self-pay

## 2020-01-21 ENCOUNTER — Other Ambulatory Visit: Payer: Self-pay

## 2020-01-21 ENCOUNTER — Encounter: Payer: Self-pay | Admitting: Family Medicine

## 2020-01-21 DIAGNOSIS — I10 Essential (primary) hypertension: Secondary | ICD-10-CM

## 2020-01-21 DIAGNOSIS — Z87891 Personal history of nicotine dependence: Secondary | ICD-10-CM

## 2020-01-21 MED ORDER — LISINOPRIL 5 MG PO TABS: 5.0000 mg | ORAL_TABLET | Freq: Every day | ORAL | 3 refills | Status: DC

## 2020-01-21 NOTE — Telephone Encounter (Signed)
Hypertension medication request:  Last office visit pertaining to hypertension:03/24/19  BP Readings from Last 3 Encounters:  08/31/19 122/82  08/20/19 (!) 130/93  03/24/19 120/78    Lab Results  Component Value Date   CREATININE 0.84 12/14/2018   BUN 10 12/14/2018   NA 138 12/14/2018   K 4.3 12/14/2018   CL 100 12/14/2018   CO2 29 12/14/2018     No follow-ups on file.

## 2020-01-21 NOTE — Telephone Encounter (Signed)
Patient has been notified that the low dose lung cancer screening CT scan is due currently or will be in near future.  Confirmed that patient is within the appropriate age range and asymptomatic, (no signs or symptoms of lung cancer).  Patient denies illness that would prevent curative treatment for lung cancer if found.  Verified smoking history (current smoker, with 45 year 0.5 ppd history).  Patient is agreeable for CT scan being scheduled.    Needs to be scheduled after 02/12/20, prefers a Monday or Friday morning.

## 2020-01-24 NOTE — Telephone Encounter (Signed)
Smoking history current, 44.5 pack year

## 2020-01-24 NOTE — Addendum Note (Signed)
Addended by: Lieutenant Diego on: 01/24/2020 01:37 PM   Modules accepted: Orders

## 2020-02-04 ENCOUNTER — Encounter: Payer: Self-pay | Admitting: Family Medicine

## 2020-02-04 ENCOUNTER — Ambulatory Visit: Payer: BLUE CROSS/BLUE SHIELD | Admitting: Family Medicine

## 2020-02-04 ENCOUNTER — Other Ambulatory Visit: Payer: Self-pay

## 2020-02-04 VITALS — BP 132/78 | HR 90 | Temp 98.1°F | Resp 16 | Ht 62.0 in | Wt 146.5 lb

## 2020-02-04 DIAGNOSIS — E1142 Type 2 diabetes mellitus with diabetic polyneuropathy: Secondary | ICD-10-CM | POA: Insufficient documentation

## 2020-02-04 DIAGNOSIS — I700 Atherosclerosis of aorta: Secondary | ICD-10-CM | POA: Diagnosis not present

## 2020-02-04 DIAGNOSIS — I10 Essential (primary) hypertension: Secondary | ICD-10-CM | POA: Diagnosis not present

## 2020-02-04 DIAGNOSIS — M62838 Other muscle spasm: Secondary | ICD-10-CM

## 2020-02-04 DIAGNOSIS — Z720 Tobacco use: Secondary | ICD-10-CM

## 2020-02-04 DIAGNOSIS — Z716 Tobacco abuse counseling: Secondary | ICD-10-CM

## 2020-02-04 DIAGNOSIS — M5030 Other cervical disc degeneration, unspecified cervical region: Secondary | ICD-10-CM

## 2020-02-04 DIAGNOSIS — Z5181 Encounter for therapeutic drug level monitoring: Secondary | ICD-10-CM

## 2020-02-04 DIAGNOSIS — M5416 Radiculopathy, lumbar region: Secondary | ICD-10-CM

## 2020-02-04 DIAGNOSIS — E782 Mixed hyperlipidemia: Secondary | ICD-10-CM

## 2020-02-04 DIAGNOSIS — K760 Fatty (change of) liver, not elsewhere classified: Secondary | ICD-10-CM

## 2020-02-04 DIAGNOSIS — J441 Chronic obstructive pulmonary disease with (acute) exacerbation: Secondary | ICD-10-CM

## 2020-02-04 DIAGNOSIS — F419 Anxiety disorder, unspecified: Secondary | ICD-10-CM

## 2020-02-04 DIAGNOSIS — F320 Major depressive disorder, single episode, mild: Secondary | ICD-10-CM

## 2020-02-04 DIAGNOSIS — J449 Chronic obstructive pulmonary disease, unspecified: Secondary | ICD-10-CM

## 2020-02-04 MED ORDER — METHOCARBAMOL 500 MG PO TABS: 500.0000 mg | ORAL_TABLET | Freq: Three times a day (TID) | ORAL | 2 refills | Status: DC | PRN

## 2020-02-04 MED ORDER — CHANTIX STARTING MONTH PAK 0.5 MG X 11 & 1 MG X 42 PO TABS: ORAL_TABLET | ORAL | 0 refills | Status: DC

## 2020-02-04 MED ORDER — VARENICLINE TARTRATE 1 MG PO TABS: 1.0000 mg | ORAL_TABLET | Freq: Two times a day (BID) | ORAL | 4 refills | Status: DC

## 2020-02-04 NOTE — Patient Instructions (Signed)
  Return in the next week for fasting labs and urine sample  We may change your statin if your cholesterol is not well controlled  Call and get mammogram scheduled  [facility] [place],  [place]  [id number] Get Driving Directions Main: [phone]

## 2020-02-04 NOTE — Progress Notes (Signed)
Name: Savannah Washington   MRN: [id number]    DOB: [birth year]   Date:02/04/2020       Progress Note  Chief Complaint  Patient presents with  . Follow-up  . Nicotine Dependence    wants to quitrx for chantix  . Depression  . Hyperlipidemia  . COPD     Subjective:   Savannah Washington is a 59 y.o. female, presents to clinic for routine follow up on the conditions listed above.  Hyperlipidemia: Current Medication Regimen:   lipitor 80 mg and vascepa  takes daily, good compliance - bedtime Last Lipids: Lab Results  Component Value Date   CHOL 198 03/24/2019   HDL 48 (L) 03/24/2019   [place]  03/24/2019     Comment:     . LDL cholesterol not calculated. Triglyceride levels greater than 400 mg/dL invalidate calculated LDL results. . Reference range: <100 . Desirable range <100 mg/dL for primary prevention;   <70 mg/dL for patients with CHD or diabetic patients  with > or = 2 CHD risk factors. Marland Kitchen LDL-C is now calculated using the Martin-Hopkins  calculation, which is a validated novel method providing  better accuracy than the Friedewald equation in the  estimation of LDL-C.  Cresenciano Genre et al. Annamaria Helling. [id number]): 2061-2068  (http://education.[employer].com/faq/FAQ164)    TRIG 583 (H) 03/24/2019   CHOLHDL 4.1 03/24/2019  - Denies: Chest pain, shortness of breath, myalgias. - Documented aortic atherosclerosis? No - Risk factors for atherosclerosis: hypercholesterolemia, hypertension and smoking Triglycerides are really high   Current smoker - tried chantix in the past - would like to try again  COPD- on trelegy and has duonebs and albuterol inhaler Does lung cancer screening CT annually  She quit smoking in the past for 16 months and then restarted smoking with death of husband and her mother died on same day as husbands burial/funeral 1 ppd estimates at least 13 years smoking average 1ppd ~45 pack year  Smoking cessation instruction/counseling given:  counseled  patient on the dangers of tobacco use, advised patient to stop smoking, and reviewed strategies to maximize success   Hypertension:   On for HTN and DM renal protection Currently managed on lisinopril 5 mg  Pt reports good med compliance and denies any SE.  No lightheadedness, hypotension, syncope. Blood pressure today is well controlled. BP Readings from Last 3 Encounters:  02/04/20 132/78  08/31/19 122/82  08/20/19 (!) 130/93   Pt denies CP, SOB, exertional sx, LE edema, palpitation, Ha's, visual disturbances  Prediabetes/DM (?) Currently managing with metformin 1000 BID Pt notes good med compliance Pt has no SE from meds. Doesn't check sugars  No hypoglycemic episodes Denies: Polyuria, polydipsia, polyphagia, vision changes, or neuropathy Recent pertinent labs: Lab Results  Component Value Date   HGBA1C 6.2 (H) 12/14/2018   HGBA1C 6.2 (H) 03/06/2018   HGBA1C 6.3 (H) 12/05/2017   Pt is due DM foot exam and eye exam ACEI/ARB: Yes Statin: Yes  MDD: Pt states she is doing well, it has been a hard year or two.  She is tired and wants to stop smoking, but doing well with wellbutrin  Depression screen [facility] 2/9 02/04/2020 10/06/2019  Decreased Interest 0 0  Down, Depressed, Hopeless 0 0  PHQ - 2 Score 0 0  Altered sleeping 1 0  Tired, decreased energy 3 0  Change in appetite 0 0  Feeling bad or failure about yourself  0 0  Trouble concentrating 0 0  Moving slowly or fidgety/restless 0 0  Suicidal thoughts 0 0  PHQ-9 Score 4 0  Difficult doing work/chores - Not difficult at all  Some recent data might be hidden    She also notes that her back hurts and sends shooting pain down her left leg   Patient Active Problem List   Diagnosis Date Noted  . Tobacco abuse 12/01/2018  . Aortic atherosclerosis ([place]) 12/29/2017  . Hepatic steatosis 12/29/2017  . COPD (chronic obstructive pulmonary disease) ([place]) 12/05/2017  . Degenerative disc disease, cervical 09/04/2017  . Cervical  arthritis 09/04/2017  . Muscle spasm of left shoulder area 09/03/2017  . Elevated platelet count 04/11/2017  . Marijuana use 03/17/2017  . Prediabetes 03/14/2017  . Essential hypertension, benign 03/14/2017  . Hyperlipidemia 03/14/2017  . Anxiety disorder 03/14/2017    History reviewed. No pertinent surgical history.  Family History  Problem Relation Age of Onset  . Dementia Mother   . Breast cancer Sister 44  . Aneurysm Father   . Heart attack Brother   . Heart defect Brother   . Brain cancer Daughter   . Heart attack Paternal Grandfather   . Drug abuse Sister     Social History   Tobacco Use  . Smoking status: Current Every Day Smoker    Packs/day: 0.50    Years: 44.00    Pack years: 22.00    Types: Cigarettes    Start date: 08/30/2018  . Smokeless tobacco: Never Used  Substance Use Topics  . Alcohol use: Yes    Alcohol/week: 5.0 standard drinks    Types: 1 Glasses of wine, 4 Cans of beer per week  . Drug use: No      Current Outpatient Medications:  .  albuterol (VENTOLIN HFA) 108 (90 Base) MCG/ACT inhaler, Inhale 2 puffs into the lungs every 4 (four) hours as needed for wheezing or shortness of breath., Disp: 18 g, Rfl: 5 .  atorvastatin (LIPITOR) 80 MG tablet, Take 1 tablet (80 mg total) by mouth at bedtime., Disp: 90 tablet, Rfl: 1 .  buPROPion (WELLBUTRIN XL) 300 MG 24 hr tablet, Take 1 tablet (300 mg total) by mouth daily., Disp: 90 tablet, Rfl: 1 .  busPIRone (BUSPAR) 15 MG tablet, Take 1 tablet (15 mg total) by mouth 2 (two) times daily., Disp: 180 tablet, Rfl: 1 .  cetirizine (ZYRTEC) 10 MG tablet, Take 10 mg by mouth daily., Disp: , Rfl:  .  Fluticasone-Umeclidin-Vilant (TRELEGY ELLIPTA) 100-62.5-25 MCG/INH AEPB, Inhale 1 each into the lungs daily., Disp: 1 each, Rfl: 12 .  ibuprofen (ADVIL) 600 MG tablet, TAKE 1 TABLET BY MOUTH THREE TIMES DAILY, Disp: 30 tablet, Rfl: 0 .  Icosapent Ethyl (VASCEPA) 1 g CAPS, Take 2 capsules (2 g total) by mouth 2 (two)  times daily., Disp: 360 capsule, Rfl: 3 .  ipratropium-albuterol (DUONEB) 0.5-2.5 (3) MG/3ML SOLN, Take 3 mLs by nebulization every 8 (eight) hours as needed., Disp: 180 mL, Rfl: 1 .  lisinopril (ZESTRIL) 5 MG tablet, Take 1 tablet (5 mg total) by mouth daily., Disp: 90 tablet, Rfl: 3 .  metFORMIN (GLUCOPHAGE) 1000 MG tablet, Take 1 tablet (1,000 mg total) by mouth 2 (two) times daily with a meal., Disp: 180 tablet, Rfl: 1 .  methocarbamol (ROBAXIN) 500 MG tablet, TAKE 1 TABLET BY MOUTH EVERY 6 HOURS AS NEEDED FOR MUSCLE SPASM, Disp: 30 tablet, Rfl: 0 .  Multiple Vitamins-Minerals (MULTI COMPLETE PO), Take 1,000 Units by mouth daily., Disp: , Rfl:  .  nystatin (MYCOSTATIN/NYSTOP) powder, Apply topically 3 (three) times  daily as needed., Disp: 15 g, Rfl: 2 .  Turmeric 500 MG CAPS, Take 500 mg by mouth daily., Disp: , Rfl:   No Known Allergies  Chart Review Today: I personally reviewed active problem list, medication list, allergies, family history, social history, health maintenance, notes from last encounter, lab results, imaging with the patient/caregiver today.   Review of Systems  10 Systems reviewed and are negative for acute change except as noted in the HPI.  Objective:    Vitals:   02/04/20 1047  BP: 132/78  Pulse: 90  Resp: 16  Temp: 98.1 F (36.7 C)  SpO2: 98%  Weight: 146 lb 8 oz (66.5 kg)  Height: 5\' 2"  (1.575 m)    Body mass index is 26.8 kg/m.  Physical Exam Vitals and nursing note reviewed.  Constitutional:      General: She is not in acute distress.    Appearance: Normal appearance. She is well-developed. She is not ill-appearing, toxic-appearing or diaphoretic.     Interventions: Face mask in place.  HENT:     Head: Normocephalic and atraumatic.     Right Ear: External ear normal.     Left Ear: External ear normal.  Eyes:     General: Lids are normal. No scleral icterus.       Right eye: No discharge.        Left eye: No discharge.      Conjunctiva/sclera: Conjunctivae normal.  Neck:     Trachea: Phonation normal. No tracheal deviation.  Cardiovascular:     Rate and Rhythm: Normal rate and regular rhythm.     Pulses: Normal pulses.          Radial pulses are 2+ on the right side and 2+ on the left side.       Posterior tibial pulses are 2+ on the right side and 2+ on the left side.     Heart sounds: Normal heart sounds. No murmur. No friction rub. No gallop.   Pulmonary:     Effort: Pulmonary effort is normal. No respiratory distress.     Breath sounds: Normal breath sounds. No stridor. No wheezing, rhonchi or rales.  Abdominal:     General: Bowel sounds are normal. There is no distension.     Palpations: Abdomen is soft.  Musculoskeletal:        General: No deformity.     Cervical back: Normal range of motion and neck supple.     Right lower leg: No edema.     Left lower leg: No edema.     Comments: Left lumbar paraspinal muscle ttp, no midline ttp from cervical to lumbar spine   Lymphadenopathy:     Cervical: No cervical adenopathy.  Skin:    General: Skin is warm and dry.     Coloration: Skin is not jaundiced or pale.     Findings: No rash.  Neurological:     Mental Status: She is alert.     Motor: No abnormal muscle tone.     Gait: Gait normal.  Psychiatric:        Speech: Speech normal.        Behavior: Behavior normal.       Diabetic Foot Exam: Diabetic Foot Exam - Simple   Simple Foot Form Diabetic Foot exam was performed with the following findings: Yes 02/04/2020 11:00 AM  Visual Inspection Sensation Testing Pulse Check Comments     PHQ2/9: Depression screen [facility] 2/9 02/04/2020 10/06/2019 08/31/2019 03/24/2019 12/24/2018  Decreased  Interest 0 0 0 0 0  Down, Depressed, Hopeless 0 0 0 0 0  PHQ - 2 Score 0 0 0 0 0  Altered sleeping 1 0 0 0 1  Tired, decreased energy 3 0 0 0 0  Change in appetite 0 0 0 0 0  Feeling bad or failure about yourself  0 0 0 0 0  Trouble concentrating 0 0 0 0 0   Moving slowly or fidgety/restless 0 0 0 0 0  Suicidal thoughts 0 0 0 0 0  PHQ-9 Score 4 0 0 0 1  Difficult doing work/chores - Not difficult at all Not difficult at all Not difficult at all Not difficult at all  Some recent data might be hidden    phq 9 is mildly positive - reviewed today, on meds  Fall Risk: Fall Risk  02/04/2020 10/06/2019 08/31/2019 03/24/2019 12/24/2018  Falls in the past year? 0 0 0 0 0  Number falls in past yr: 0 0 0 0 0  Injury with Fall? 0 0 0 0 0    Functional Status Survey: Is the patient deaf or have difficulty hearing?: No Does the patient have difficulty seeing, even when wearing glasses/contacts?: No Does the patient have difficulty concentrating, remembering, or making decisions?: No Does the patient have difficulty walking or climbing stairs?: No Does the patient have difficulty dressing or bathing?: No Does the patient have difficulty doing errands alone such as visiting a doctor's office or shopping?: No   Assessment & Plan:     ICD-10-CM   1. Mixed hyperlipidemia  E78.2 Lipid panel    COMPLETE METABOLIC PANEL WITH GFR   vascepa and statin - failed lovaza in the past, due for labs (about one year ago) pt endorses good med compliance and no SE or myalgias  2. Essential hypertension, benign  [id number] COMPLETE METABOLIC PANEL WITH GFR   well controlled and stable BP on lisinopril  3. Type 2 diabetes mellitus with diabetic polyneuropathy, without long-term current use of insulin (HCC)  E11.42 Microalbumin, urine    Hemoglobin A1c    Lipid panel    COMPLETE METABOLIC PANEL WITH GFR    Ambulatory referral to Ophthalmology   foot exam done today, referred to ophtho for DM eye exam - overdue for labs, has been well controlled, compliant with metformin  4. Aortic atherosclerosis (HCC)  I70.0 Lipid panel    COMPLETE METABOLIC PANEL WITH GFR   on statin, monitoring  5. Hepatic steatosis  K76.0 Lipid panel    COMPLETE METABOLIC PANEL WITH GFR   on high intensity  statin and vascepa, enccourage healthy diet and exercise, monitoring LFTs  6. Left lumbar radiculitis  M54.16 methocarbamol (ROBAXIN) 500 MG tablet   flare of low back pain, hx of neck pain, back pain - refill robaxin, encouraged supportive/sx tx, gentle stretching, heat thx f/up if not improving, no red flag  7. Chronic obstructive pulmonary disease, unspecified COPD type ([place])  J44.9    currently well controlled on trelegy, has duonebs/albuterol inhaler, pt wants to stop smoking, felt breathing was better when stop smoking - see below  8. Tobacco abuse  Z72.0 Lipid panel    COMPLETE METABOLIC PANEL WITH GFR    CBC with Differential/Platelet   tobacco counseling done today, discussed restarting chantix, f/up if needed, resources offered  9. Current mild episode of major depressive disorder, unspecified whether recurrent (HCC)  F32.0    mild dx, doing well with wellbutrin  10. Anxiety disorder, unspecified type  F41.9    sx well controlled with wellbutrin and buspar  11. Encounter for smoking cessation counseling  Z71.6 varenicline (CHANTIX STARTING MONTH PAK) 0.5 MG X 11 & 1 MG X 42 tablet    varenicline (CHANTIX) 1 MG tablet   done today - chantix sent in   12. Encounter for medication monitoring  Z51.81 Microalbumin, urine    Hemoglobin A1c    Lipid panel    COMPLETE METABOLIC PANEL WITH GFR    CBC with Differential/Platelet   due for labs today - last were about a year ago     No follow-ups on file.   [name], PA-C 02/04/20 10:56 AM

## 2020-02-10 ENCOUNTER — Encounter: Payer: Self-pay | Admitting: Family Medicine

## 2020-02-10 ENCOUNTER — Ambulatory Visit (INDEPENDENT_AMBULATORY_CARE_PROVIDER_SITE_OTHER): Payer: BLUE CROSS/BLUE SHIELD | Admitting: Internal Medicine

## 2020-02-10 ENCOUNTER — Encounter: Payer: Self-pay | Admitting: Internal Medicine

## 2020-02-10 VITALS — Ht 62.0 in | Wt 145.0 lb

## 2020-02-10 DIAGNOSIS — F17210 Nicotine dependence, cigarettes, uncomplicated: Secondary | ICD-10-CM

## 2020-02-10 DIAGNOSIS — I10 Essential (primary) hypertension: Secondary | ICD-10-CM

## 2020-02-10 DIAGNOSIS — J301 Allergic rhinitis due to pollen: Secondary | ICD-10-CM | POA: Diagnosis not present

## 2020-02-10 DIAGNOSIS — J069 Acute upper respiratory infection, unspecified: Secondary | ICD-10-CM

## 2020-02-10 DIAGNOSIS — J00 Acute nasopharyngitis [common cold]: Secondary | ICD-10-CM

## 2020-02-10 DIAGNOSIS — F17200 Nicotine dependence, unspecified, uncomplicated: Secondary | ICD-10-CM

## 2020-02-10 NOTE — Progress Notes (Signed)
Name: Savannah Washington   MRN: [id number]    DOB: [birth year]   Date:02/10/2020       Progress Note  Subjective  Chief Complaint  Chief Complaint  Patient presents with  . Nasal Congestion    Head congestion  . Cough    started yesterday morning cough has improved some today    I connected with  Dorthula Perfect on 02/10/20 at 10:00 AM EDT by telephone and verified that I am speaking with the correct person using two identifiers.  I discussed the limitations, risks, security and privacy concerns of performing an evaluation and management service by telephone and the availability of in person appointments. Staff also discussed with the patient that there may be a patient responsible charge related to this service. Patient Location: Home Provider Location: [facility] Additional Individuals present: none I was having technical difficulties with the video, and gave her a phone call and she agreed to continue the visit by phone HPI  Patient is a 59 year old female patient of [name] Presents today with cough, started yesterday morning,  And this morning increased head congestion. Not blowing nose much, feels like drying up, clear mucus. Feels more under eyes and nasal congestion.  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 Type 2 diabetes mellitus with diabetic polyneuropathy, without long-term current use of insulin ([place]) 02/04/2020  . Tobacco abuse 12/01/2018  . Aortic atherosclerosis ([place]) 12/29/2017  . Hepatic steatosis 12/29/2017  . COPD (chronic obstructive pulmonary disease) ([place]) 12/05/2017  . Degenerative disc disease, cervical 09/04/2017  . Cervical arthritis 09/04/2017  . Muscle spasm of left shoulder area 09/03/2017  . Elevated platelet count 04/11/2017  . Marijuana use 03/17/2017  . Prediabetes 03/14/2017  . Essential hypertension, benign 03/14/2017  . Hyperlipidemia 03/14/2017  . Anxiety disorder 03/14/2017    No past surgical history on file.  Family History  Problem Relation Age of Onset  . Dementia Mother   . Breast cancer Sister 54  . Aneurysm Father   . Heart attack Brother   . Heart defect Brother   . Brain cancer Daughter   . Heart attack Paternal Grandfather   . Drug abuse Sister     Social History   Tobacco Use  . Smoking status: Current Every Day Smoker    Packs/day: 0.50    Years: 44.00    Pack years: 22.00    Types: Cigarettes    Start date: 08/30/2018  . Smokeless tobacco: Never Used  Substance Use Topics  . Alcohol use: Yes    Alcohol/week: 5.0 standard drinks    Types: 1 Glasses of wine, 4 Cans of beer per week     Current Outpatient Medications:  .  albuterol (VENTOLIN HFA)  108 (90 Base) MCG/ACT inhaler, Inhale 2 puffs into the lungs every 4 (four) hours as needed for wheezing or shortness of breath., Disp: 18 g, Rfl: 5 .  atorvastatin (LIPITOR) 80 MG tablet, Take 1 tablet (80 mg total) by mouth at bedtime., Disp: 90 tablet, Rfl: 1 .  buPROPion (WELLBUTRIN XL) 300 MG 24 hr tablet, Take 1 tablet (300 mg total) by mouth daily., Disp: 90 tablet, Rfl: 1 .  busPIRone (BUSPAR) 15 MG tablet, Take 1 tablet (15 mg total) by mouth 2 (two) times daily., Disp: 180 tablet, Rfl: 1 .  cetirizine (ZYRTEC) 10 MG tablet, Take 10 mg  by mouth daily., Disp: , Rfl:  .  Fluticasone-Umeclidin-Vilant (TRELEGY ELLIPTA) 100-62.5-25 MCG/INH AEPB, Inhale 1 each into the lungs daily., Disp: 1 each, Rfl: 12 .  ibuprofen (ADVIL) 600 MG tablet, TAKE 1 TABLET BY MOUTH THREE TIMES DAILY, Disp: 30 tablet, Rfl: 0 .  Icosapent Ethyl (VASCEPA) 1 g CAPS, Take 2 capsules (2 g total) by mouth 2 (two) times daily., Disp: 360 capsule, Rfl: 3 .  ipratropium-albuterol (DUONEB) 0.5-2.5 (3) MG/3ML SOLN, Take 3 mLs by nebulization every 8 (eight) hours as needed., Disp: 180 mL, Rfl: 1 .  lisinopril (ZESTRIL) 5 MG tablet, Take 1 tablet (5 mg total) by mouth daily., Disp: 90 tablet, Rfl: 3 .  metFORMIN (GLUCOPHAGE) 1000 MG tablet, Take 1 tablet (1,000 mg total) by mouth 2 (two) times daily with a meal., Disp: 180 tablet, Rfl: 1 .  methocarbamol (ROBAXIN) 500 MG tablet, Take 1 tablet (500 mg total) by mouth 3 (three) times daily as needed for muscle spasms., Disp: 90 tablet, Rfl: 2 .  Multiple Vitamins-Minerals (MULTI COMPLETE PO), Take 1,000 Units by mouth daily., Disp: , Rfl:  .  nystatin (MYCOSTATIN/NYSTOP) powder, Apply topically 3 (three) times daily as needed., Disp: 15 g, Rfl: 2 .  Turmeric 500 MG CAPS, Take 500 mg by mouth daily., Disp: , Rfl:  .  varenicline (CHANTIX STARTING MONTH PAK) 0.5 MG X 11 & 1 MG X 42 tablet, Take 0.5 mg tablet by mouth 1x daily for 3 days, then increase to 0.5 mg tablet 2x daily for 4 days, then increase to 1 mg tablet 2x daily., Disp: 53 tablet, Rfl: 0 .  [START ON 03/06/2020] varenicline (CHANTIX) 1 MG tablet, Take 1 tablet (1 mg total) by mouth 2 (two) times daily., Disp: 60 tablet, Rfl: 4  No Known Allergies  With staff assistance, above reviewed with the patient today.  ROS: As per HPI, otherwise no specific complaints on a limited and focused system review   Objective  Virtual encounter, vitals not obtained.  Body mass index is 26.52 kg/m.  Physical Exam   Appears in NAD via conversation, pleasant, no  coughing episodes during our phone conversation today Breathing: No obvious respiratory distress. Speaking in complete sentences Neurological: Pt is alert and oriented, Speech is normal Psychiatric: Patient has a normal mood and affect,  Judgment and thought content normal.   No results found for this or any previous visit (from the past 72 hour(s)).  PHQ2/9: Depression screen [facility] 2/9 02/10/2020 02/04/2020 10/06/2019 08/31/2019 03/24/2019  Decreased Interest 0 0 0 0 0  Down, Depressed, Hopeless 0 0 0 0 0  PHQ - 2 Score 0 0 0 0 0  Altered sleeping 1 1 0 0 0  Tired, decreased energy 0 3 0 0 0  Change in appetite 0 0 0 0 0  Feeling bad or failure about yourself  0 0  0 0 0  Trouble concentrating 0 0 0 0 0  Moving slowly or fidgety/restless 0 0 0 0 0  Suicidal thoughts 0 0 0 0 0  PHQ-9 Score 1 4 0 0 0  Difficult doing work/chores Not difficult at all - Not difficult at all Not difficult at all Not difficult at all  Some recent data might be hidden   PHQ-2/9 Result reviewed  Fall Risk: Fall Risk  02/10/2020 02/04/2020 10/06/2019 08/31/2019 03/24/2019  Falls in the past year? 0 0 0 0 0  Number falls in past yr: 0 0 0 0 0  Injury with Fall? 0 0 0 0 0     Assessment & Plan 1. Viral upper respiratory tract infection/cough Educated patient, do feel may have the beginnings of an infectious component in addition to contributions from an allergic rhinitis component presently.  Reassured no fevers, no chest congestion, no shortness of breath, with it mostly nasal congestion presently.  Discussed if there is an infectious component, these are most likely viral, and did note Covid is always in the differential presently as well.  Emphasized the importance of closely monitoring over the next few days and if symptoms increasing or new ones developing, following up and should get a Covid test likely as well were concerns for this arise. We will not add antibiotics presently and discussed reasoning for that and she  understood. Symptomatic measures with OTC products prn (like an OTC mucinex or robitussin product with an expectorant) recommended, and do not think adding a Sudafed product is good presently and she noted she tries to avoid with her blood pressure history. OTC acetaminophen products prn Fluticasone nasal spray, can use twice daily first 3 days, then once daily after to help, and she notes she has Benadryl at home, and can use as needed in the short-term, and can take 2 at bedtime to help, Rest and increased fluids recommended Discussed concerns with potential contagiousness and staying especially can relatively isolated at home until symptoms improve/resolve (she notes that she has her son at home presently) Follow-up if not better or worsening emphasized at length today  2. Acute rhinitis Did note likely has an allergy contribution as well to her symptoms  3. Seasonal allergic rhinitis due to pollen As above  4. Essential hypertension, benign Avoiding pseudoephedrine type products recommended presently.  5. Tobacco dependence Strongly encouraged her to stay with the plans of trying to quit smoking as she just started Chantix and the importance of this. Do not have more concerns for a COPD exacerbation presently, although needs to continue to closely monitor as we discussed today.    I discussed the assessment and treatment plan with the patient. The patient was provided an opportunity to ask questions and all were answered. The patient agreed with the plan and demonstrated an understanding of the instructions.  Red flags and when to present for emergency care or RTC including higher fever, chest pain, shortness of breath, new/worsening/un-resolving symptoms reviewed with patient at time of visit.   The patient was advised to call back or seek an in-person evaluation if the symptoms worsen or if the condition fails to improve as anticipated.  I provided 15 minutes of non-face-to-face  time during this encounter that included discussing at length patient's sx/history, pertinent pmhx, medications, treatment and follow up plan. This time also included the necessary documentation, orders, and chart review.  Towanda Malkin, MD

## 2020-02-14 ENCOUNTER — Ambulatory Visit: Payer: BLUE CROSS/BLUE SHIELD

## 2020-02-20 ENCOUNTER — Encounter: Payer: Self-pay | Admitting: Family Medicine

## 2020-02-22 ENCOUNTER — Telehealth: Payer: Self-pay | Admitting: *Deleted

## 2020-02-22 NOTE — Telephone Encounter (Signed)
Notified patient of LDCT lung cancer screening program results with recommendation for 3 month follow up imaging. Also notified of incidental findings noted below and is encouraged to discuss further with PCP who will receive a copy of this note and/or the CT report. Patient verbalizes understanding.   IMPRESSION:  Lung-RADS Category: 2 S given the presence of patchy bilateral centrilobular groundglass opacities which may be infectious versus inflammatory in etiology. Recommend follow-up CT chest in 3 months for reassessment.

## 2020-02-25 ENCOUNTER — Ambulatory Visit: Payer: BLUE CROSS/BLUE SHIELD | Admitting: Cardiothoracic Surgery

## 2020-02-29 ENCOUNTER — Encounter: Payer: Self-pay | Admitting: Family Medicine

## 2020-03-01 ENCOUNTER — Telehealth: Payer: Self-pay | Admitting: Family Medicine

## 2020-03-01 ENCOUNTER — Encounter: Payer: Self-pay | Admitting: Family Medicine

## 2020-03-01 ENCOUNTER — Ambulatory Visit (INDEPENDENT_AMBULATORY_CARE_PROVIDER_SITE_OTHER): Payer: BLUE CROSS/BLUE SHIELD | Admitting: Family Medicine

## 2020-03-01 VITALS — BP 118/78 | HR 97 | Temp 97.9°F | Resp 14 | Ht 62.0 in | Wt 141.7 lb

## 2020-03-01 DIAGNOSIS — M5030 Other cervical disc degeneration, unspecified cervical region: Secondary | ICD-10-CM

## 2020-03-01 DIAGNOSIS — M25512 Pain in left shoulder: Secondary | ICD-10-CM | POA: Diagnosis not present

## 2020-03-01 DIAGNOSIS — M5412 Radiculopathy, cervical region: Secondary | ICD-10-CM

## 2020-03-01 MED ORDER — PREGABALIN 25 MG PO CAPS
25.0000 mg | ORAL_CAPSULE | Freq: Two times a day (BID) | ORAL | 2 refills | Status: DC
Start: 2020-03-01 — End: 2020-06-12

## 2020-03-01 MED ORDER — TRAMADOL HCL 50 MG PO TABS: 50.0000 mg | ORAL_TABLET | Freq: Three times a day (TID) | ORAL | 0 refills | Status: DC | PRN

## 2020-03-01 MED ORDER — MELOXICAM 15 MG PO TABS
15.0000 mg | ORAL_TABLET | Freq: Every day | ORAL | 1 refills | Status: DC
Start: 2020-03-01 — End: 2020-03-02

## 2020-03-01 NOTE — Telephone Encounter (Signed)
Representative from Pharmacy called saying they are out of Tramadol and the refill rquest needs to be sent to another location

## 2020-03-01 NOTE — Progress Notes (Signed)
Patient ID: Savannah Washington, female    DOB: 01-May-1961, 59 y.o.   MRN: [id number]  PCP: [name], PA-C  Chief Complaint  Patient presents with  . Neck Pain    Subjective:   Savannah Washington is a 59 y.o. female, presents to clinic with CC of the following:  Shoulder Pain  The pain is present in the left shoulder, left arm, back and neck. This is a recurrent problem. The current episode started 1 to 4 weeks ago. There has been no history of extremity trauma. The problem occurs constantly. The problem has been gradually worsening. The quality of the pain is described as aching and sharp. The pain is at a severity of 10/10. Associated symptoms include numbness and tingling. Pertinent negatives include no fever, inability to bear weight, itching, joint locking, joint swelling, limited range of motion or stiffness. The symptoms are aggravated by activity. She has tried nothing for the symptoms. Family history does not include gout or rheumatoid arthritis. Her past medical history is significant for osteoarthritis.  Neck Pain  This is a chronic problem. The problem occurs constantly. The problem has been waxing and waning. The pain is associated with nothing. The pain is present in the left side. The symptoms are aggravated by position and twisting. The pain is same all the time. Associated symptoms include numbness and tingling. Pertinent negatives include no chest pain, fever, headaches, leg pain, pain with swallowing, paresis, photophobia, syncope, trouble swallowing, visual change, weakness or weight loss. She has tried chiropractic manipulation and muscle relaxants for the symptoms. The treatment provided no relief.      Patient Active Problem List   Diagnosis Date Noted  . Type 2 diabetes mellitus with diabetic polyneuropathy, without long-term current use of insulin ([place]) 02/04/2020  . Tobacco abuse 12/01/2018  . Aortic atherosclerosis ([place]) 12/29/2017  . Hepatic steatosis 12/29/2017   . COPD (chronic obstructive pulmonary disease) ([place]) 12/05/2017  . Degenerative disc disease, cervical 09/04/2017  . Cervical arthritis 09/04/2017  . Muscle spasm of left shoulder area 09/03/2017  . Elevated platelet count 04/11/2017  . Marijuana use 03/17/2017  . Prediabetes 03/14/2017  . Essential hypertension, benign 03/14/2017  . Hyperlipidemia 03/14/2017  . Anxiety disorder 03/14/2017      Current Outpatient Medications:  .  albuterol (VENTOLIN HFA) 108 (90 Base) MCG/ACT inhaler, Inhale 2 puffs into the lungs every 4 (four) hours as needed for wheezing or shortness of breath., Disp: 18 g, Rfl: 5 .  atorvastatin (LIPITOR) 80 MG tablet, Take 1 tablet (80 mg total) by mouth at bedtime., Disp: 90 tablet, Rfl: 1 .  buPROPion (WELLBUTRIN XL) 300 MG 24 hr tablet, Take 1 tablet (300 mg total) by mouth daily., Disp: 90 tablet, Rfl: 1 .  busPIRone (BUSPAR) 15 MG tablet, Take 1 tablet (15 mg total) by mouth 2 (two) times daily., Disp: 180 tablet, Rfl: 1 .  cetirizine (ZYRTEC) 10 MG tablet, Take 10 mg by mouth daily., Disp: , Rfl:  .  Fluticasone-Umeclidin-Vilant (TRELEGY ELLIPTA) 100-62.5-25 MCG/INH AEPB, Inhale 1 each into the lungs daily., Disp: 1 each, Rfl: 12 .  ibuprofen (ADVIL) 600 MG tablet, TAKE 1 TABLET BY MOUTH THREE TIMES DAILY, Disp: 30 tablet, Rfl: 0 .  Icosapent Ethyl (VASCEPA) 1 g CAPS, Take 2 capsules (2 g total) by mouth 2 (two) times daily., Disp: 360 capsule, Rfl: 3 .  ipratropium-albuterol (DUONEB) 0.5-2.5 (3) MG/3ML SOLN, Take 3 mLs by nebulization every 8 (eight) hours as needed., Disp: 180 mL,  Rfl: 1 .  lisinopril (ZESTRIL) 5 MG tablet, Take 1 tablet (5 mg total) by mouth daily., Disp: 90 tablet, Rfl: 3 .  metFORMIN (GLUCOPHAGE) 1000 MG tablet, Take 1 tablet (1,000 mg total) by mouth 2 (two) times daily with a meal., Disp: 180 tablet, Rfl: 1 .  methocarbamol (ROBAXIN) 500 MG tablet, Take 1 tablet (500 mg total) by mouth 3 (three) times daily as needed for muscle spasms.,  Disp: 90 tablet, Rfl: 2 .  Multiple Vitamins-Minerals (MULTI COMPLETE PO), Take 1,000 Units by mouth daily., Disp: , Rfl:  .  nystatin (MYCOSTATIN/NYSTOP) powder, Apply topically 3 (three) times daily as needed., Disp: 15 g, Rfl: 2 .  Turmeric 500 MG CAPS, Take 500 mg by mouth daily., Disp: , Rfl:  .  varenicline (CHANTIX STARTING MONTH PAK) 0.5 MG X 11 & 1 MG X 42 tablet, Take 0.5 mg tablet by mouth 1x daily for 3 days, then increase to 0.5 mg tablet 2x daily for 4 days, then increase to 1 mg tablet 2x daily., Disp: 53 tablet, Rfl: 0 .  [START ON 03/06/2020] varenicline (CHANTIX) 1 MG tablet, Take 1 tablet (1 mg total) by mouth 2 (two) times daily., Disp: 60 tablet, Rfl: 4   No Known Allergies   Family History  Problem Relation Age of Onset  . Dementia Mother   . Breast cancer Sister 69  . Aneurysm Father   . Heart attack Brother   . Heart defect Brother   . Brain cancer Daughter   . Heart attack Paternal Grandfather   . Drug abuse Sister      Social History   Socioeconomic History  . Marital status: Widowed    Spouse name: Not on file  . Number of children: Not on file  . Years of education: Not on file  . Highest education level: Not on file  Occupational History  . Not on file  Tobacco Use  . Smoking status: Current Every Day Smoker    Packs/day: 0.50    Years: 44.00    Pack years: 22.00    Types: Cigarettes    Start date: 08/30/2018  . Smokeless tobacco: Never Used  Substance and Sexual Activity  . Alcohol use: Yes    Alcohol/week: 5.0 standard drinks    Types: 1 Glasses of wine, 4 Cans of beer per week  . Drug use: No  . Sexual activity: Not Currently    Birth control/protection: Post-menopausal  Other Topics Concern  . Not on file  Social History Narrative  . Not on file   Social Determinants of Health   Financial Resource Strain:   . Difficulty of Paying Living Expenses:   Food Insecurity:   . Worried About Charity fundraiser in the Last Year:   . Arts development officer in the Last Year:   Transportation Needs:   . Film/video editor (Medical):   Marland Kitchen Lack of Transportation (Non-Medical):   Physical Activity:   . Days of Exercise per Week:   . Minutes of Exercise per Session:   Stress:   . Feeling of Stress :   Social Connections:   . Frequency of Communication with Friends and Family:   . Frequency of Social Gatherings with Friends and Family:   . Attends Religious Services:   . Active Member of Clubs or Organizations:   . Attends Archivist Meetings:   Marland Kitchen Marital Status:   Intimate Partner Violence:   . Fear of Current or Ex-Partner:   .  Emotionally Abused:   Marland Kitchen Physically Abused:   . Sexually Abused:     Chart Review Today: I personally reviewed active problem list, medication list, allergies, family history, social history, health maintenance, notes from last encounter, lab results, imaging with the patient/caregiver today.   Review of Systems  Constitutional: Negative.  Negative for fever and weight loss.  HENT: Negative.  Negative for trouble swallowing.   Eyes: Negative.  Negative for photophobia.  Respiratory: Negative.   Cardiovascular: Negative.  Negative for chest pain and syncope.  Gastrointestinal: Negative.   Endocrine: Negative.   Genitourinary: Negative.   Musculoskeletal: Positive for neck pain. Negative for stiffness.  Skin: Negative for itching.  Allergic/Immunologic: Negative.   Neurological: Positive for tingling and numbness. Negative for weakness and headaches.  Hematological: Negative.   All other systems reviewed and are negative.      Objective:   Vitals:   03/01/20 1349  BP: 118/78  Pulse: 97  Resp: 14  Temp: 97.9 F (36.6 C)  SpO2: 93%  Weight: 141 lb 11.2 oz (64.3 kg)  Height: 5\' 2"  (1.575 m)    Body mass index is 25.92 kg/m.  Physical Exam Vitals and nursing note reviewed.  Constitutional:      General: She is not in acute distress.    Appearance: She is not  ill-appearing, toxic-appearing or diaphoretic.  Neck:     Trachea: Trachea and phonation normal.     Comments: Good flexion and extension Slightly decrease rotation to the right, normal rotation to left - B/L rotation >45 degrees Increased tension to left trap Musculoskeletal:     Left shoulder: Tenderness present. Normal range of motion. Normal strength. Normal pulse.     Cervical back: Muscular tenderness present. No spinous process tenderness.  Neurological:     Mental Status: She is alert.      Results for orders placed or performed in visit on 09/30/19  Novel Coronavirus, NAA ([facility])   Specimen: Nasopharyngeal(NP) swabs in vial transport medium   NASOPHARYNGE  TESTING  Result Value Ref Range   SARS-CoV-2, NAA CANCELED         Assessment & Plan:      ICD-10-CM   1. Degenerative disc disease, cervical  M50.30 Ambulatory referral to Orthopedics  2. Cervical radiculopathy  M54.12 Ambulatory referral to Orthopedics  3. Left shoulder pain, unspecified chronicity  M25.512 Ambulatory referral to Orthopedics    Worsening neck pain, please see prior office visit -had reviewed some of her neck and back pain at that time and was given Robaxin, encouraged to do supportive treatment gentle stretching and heat therapy.  Today much worse with radicular symptoms add Mobic, Lyrica and as needed tramadol for severe pain.  Will refer to Ortho for further evaluation.   Our clinic computers, EMR and phones were down during the time of the patient's encounter today  [name], PA-C 03/01/20 1:53 PM

## 2020-03-02 ENCOUNTER — Other Ambulatory Visit: Payer: Self-pay

## 2020-03-02 MED ORDER — MELOXICAM 15 MG PO TABS: 15.0000 mg | ORAL_TABLET | Freq: Every day | ORAL | 1 refills | Status: DC

## 2020-03-02 NOTE — Telephone Encounter (Signed)
Pt states send to [facility] [address]

## 2020-03-03 MED ORDER — TRAMADOL HCL 50 MG PO TABS: 50.0000 mg | ORAL_TABLET | Freq: Three times a day (TID) | ORAL | 0 refills | Status: AC | PRN

## 2020-03-06 ENCOUNTER — Other Ambulatory Visit: Payer: Self-pay

## 2020-03-06 ENCOUNTER — Ambulatory Visit (INDEPENDENT_AMBULATORY_CARE_PROVIDER_SITE_OTHER): Payer: BLUE CROSS/BLUE SHIELD | Admitting: Cardiothoracic Surgery

## 2020-03-06 ENCOUNTER — Encounter: Payer: Self-pay | Admitting: Cardiothoracic Surgery

## 2020-03-06 VITALS — BP 132/94 | HR 76 | Temp 97.9°F | Resp 11 | Ht 62.0 in | Wt 144.0 lb

## 2020-03-06 DIAGNOSIS — R911 Solitary pulmonary nodule: Secondary | ICD-10-CM | POA: Diagnosis not present

## 2020-03-06 NOTE — Patient Instructions (Addendum)
We will have our radiologists review your images.   We will have Savannah Washington contact you about the smoking cessation program.  Call us on Monday to get your review results.  We will have you get repeat Chest CT in 3 months at [facility] and have them send the CD of images to [place].

## 2020-03-06 NOTE — Progress Notes (Signed)
Savannah Washington Follow Up Note  Patient ID: Savannah Washington, female   DOB: July 04, 1961, 59 y.o.   MRN: [id number]  HISTORY: She returns today in follow-up.  She did have a chest CT scan made several weeks ago.  Because of her insurance she had those made at [facility] and I did review those with her.  I am unable to pull them up from our system but we could view the report and also could review some of the images as well.  Unfortunately she does continue to smoke about a pack of cigarettes every other day.  I did review with her in detail of the smoking cessation program we have she has declined any further intervention at this time.    Vitals:   03/06/20 0924  BP: (!) 132/94  Pulse: 76  Resp: 11  Temp: 97.9 F (36.6 C)  SpO2: 92%     EXAM:  Resp: Lungs are clear bilaterally.  No respiratory distress, normal effort. Heart:  Regular without murmurs Abd:  Abdomen is soft, non distended and non tender. No masses are palpable.  There is no rebound and no guarding.  Neurological: Alert and oriented to person, place, and time. Coordination normal.  Skin: Skin is warm and dry. No rash noted. No diaphoretic. No erythema. No pallor.  Psychiatric: Normal mood and affect. Normal behavior. Judgment and thought content normal.      ASSESSMENT: I have independently reviewed the patient's CT scan.  There are groundglass opacities scattered throughout the lungs.  I reviewed those with the prior CT scan that we have in our system.  It was difficult for me to compare them side-by-side as I was unable to pull them up and our current work environment.  However some seem old and some do seem to be new since the last CT.   PLAN:   I would like to have our radiologist officially review the most recent CT scan that was performed at [facility].  In addition I will bring her back in 3 months time with another CT scan as that was the official recommendation from [facility].  She declined smoking cessation intervention.    Nestor Lewandowsky,  MD

## 2020-03-07 LAB — COMPLETE METABOLIC PANEL WITH GFR
AG Ratio: 2 (calc) (ref 1.0–2.5)
ALT: 12 U/L (ref 6–29)
AST: 24 U/L (ref 10–35)
Albumin: 4.3 g/dL (ref 3.6–5.1)
Alkaline phosphatase (APISO): 57 U/L (ref 37–153)
BUN: 7 mg/dL (ref 7–25)
CO2: 32 mmol/L (ref 20–32)
Calcium: 9.4 mg/dL (ref 8.6–10.4)
Chloride: 100 mmol/L (ref 98–110)
Creat: 0.69 mg/dL (ref 0.50–0.99)
GFR, Est African American: 110 mL/min/{1.73_m2} (ref >=60)
GFR, Est Non African American: 95 mL/min/{1.73_m2} (ref >=60)
Globulin: 2.1 g/dL (calc) (ref 1.9–3.7)
Glucose, Bld: 103 mg/dL — ABNORMAL HIGH (ref 65–99)
Potassium: 5.3 mmol/L (ref 3.5–5.3)
Sodium: 138 mmol/L (ref 135–146)
Total Bilirubin: 0.3 mg/dL (ref 0.2–1.2)
Total Protein: 6.4 g/dL (ref 6.1–8.1)

## 2020-03-07 LAB — LIPID PANEL
Cholesterol: 175 mg/dL (ref <200)
HDL: 61 mg/dL (ref >=50)
LDL Cholesterol (Calc): 86 mg/dL (calc)
Non-HDL Cholesterol (Calc): 114 mg/dL (calc) (ref <130)
Total CHOL/HDL Ratio: 2.9 (calc) (ref <5.0)
Triglycerides: 183 mg/dL — ABNORMAL HIGH (ref <150)

## 2020-03-07 LAB — CBC WITH DIFFERENTIAL/PLATELET
Absolute Monocytes: 812 cells/uL (ref 200–950)
Basophils Absolute: 57 cells/uL (ref 0–200)
Basophils Relative: 0.7 %
Eosinophils Absolute: 418 cells/uL (ref 15–500)
Eosinophils Relative: 5.1 %
HCT: 44.5 % (ref 35.0–45.0)
Hemoglobin: 14.9 g/dL (ref 11.7–15.5)
Lymphs Abs: 2403 cells/uL (ref 850–3900)
MCH: 31.2 pg (ref 27.0–33.0)
MCHC: 33.5 g/dL (ref 32.0–36.0)
MCV: 93.1 fL (ref 80.0–100.0)
MPV: 8.3 fL (ref 7.5–12.5)
Monocytes Relative: 9.9 %
Neutro Abs: 4510 cells/uL (ref 1500–7800)
Neutrophils Relative %: 55 %
Platelets: 465 10*3/uL — ABNORMAL HIGH (ref 140–400)
RBC: 4.78 10*6/uL (ref 3.80–5.10)
RDW: 12.5 % (ref 11.0–15.0)
Total Lymphocyte: 29.3 %
WBC: 8.2 10*3/uL (ref 3.8–10.8)

## 2020-03-07 LAB — HEMOGLOBIN A1C
Hgb A1c MFr Bld: 5.7 % of total Hgb — ABNORMAL HIGH (ref <5.7)
Mean Plasma Glucose: 117 (calc)
eAG (mmol/L): 6.5 (calc)

## 2020-03-07 LAB — MICROALBUMIN, URINE: Microalb, Ur: 0.5 mg/dL

## 2020-03-08 ENCOUNTER — Encounter: Payer: Self-pay | Admitting: Family Medicine

## 2020-03-17 ENCOUNTER — Telehealth: Payer: Self-pay | Admitting: Cardiothoracic Surgery

## 2020-03-17 NOTE — Telephone Encounter (Signed)
Patient is calling and is asking if her results have came in was told to call the office. Patient can be reached at [phone]. Please call patient and advise.

## 2020-03-17 NOTE — Telephone Encounter (Signed)
Dr Genevive Bi notified and will call her back today.

## 2020-03-29 ENCOUNTER — Telehealth: Payer: Self-pay | Admitting: Oncology

## 2020-03-29 NOTE — Telephone Encounter (Signed)
Re:  Smoking Cessation   Received referral from patient's PCP, [name].  Called to introduce our smoking cessation program and to see when she might be available to discuss our program more in detail.  Patient would like a return phone call back next week.   Will call back on 04/04/2020 around 10 AM.   Faythe Casa, NP 03/29/2020 2:32 PM

## 2020-04-04 ENCOUNTER — Inpatient Hospital Stay: Payer: BLUE CROSS/BLUE SHIELD | Attending: Oncology | Admitting: Oncology

## 2020-04-04 ENCOUNTER — Other Ambulatory Visit: Payer: Self-pay

## 2020-04-04 DIAGNOSIS — Z79899 Other long term (current) drug therapy: Secondary | ICD-10-CM | POA: Insufficient documentation

## 2020-04-04 DIAGNOSIS — F17290 Nicotine dependence, other tobacco product, uncomplicated: Secondary | ICD-10-CM | POA: Diagnosis not present

## 2020-04-04 DIAGNOSIS — Z122 Encounter for screening for malignant neoplasm of respiratory organs: Secondary | ICD-10-CM | POA: Insufficient documentation

## 2020-04-04 DIAGNOSIS — Z803 Family history of malignant neoplasm of breast: Secondary | ICD-10-CM | POA: Insufficient documentation

## 2020-04-04 DIAGNOSIS — F17210 Nicotine dependence, cigarettes, uncomplicated: Secondary | ICD-10-CM | POA: Insufficient documentation

## 2020-04-04 MED ORDER — NICOTINE 21 MG/24HR TD PT24: 21.0000 mg | MEDICATED_PATCH | Freq: Every day | TRANSDERMAL | 0 refills | Status: DC

## 2020-04-04 MED ORDER — NICOTINE 10 MG IN INHA: 1.0000 | RESPIRATORY_TRACT | 0 refills | Status: DC | PRN

## 2020-04-04 NOTE — Progress Notes (Signed)
Smoking Cessation [facility]  [phone] [phone]  Patient Care Team: [name], Hershal Coria as PCP - General (Family Medicine)   Name of the patient: Savannah Washington  [id number]  [birth year]   Date of visit: 04/04/2020   Chief complaint/ Reason for visit- Smoking Cessation counseling  PMH-Mrs. Bergthold is a 59 yo female with pmh significant for hypertension, atherosclerosis, COPD, hepatic steatosis, type 2 diabetes, degenerative disc disease hyperlipidemia tobacco abuse for many years who is interested in quitting smoking.  Interval history- Tyjah Hai is a 59 yo patient who presents to smoking cessation clinic to discuss strategies and techniques to quit smoking.  The goal of this program is to implement proven ineffective interventions in a clinical setting to successfully quit smoking.   During our visit today, we will discuss strategies for tobacco cessation.   Smoking history:   Tobacco Use: High Risk  . Smoking Tobacco Use: Current Every Day Smoker  . Smokeless Tobacco Use: Never Used     Type of tobacco:   Cigarettes- 1-2ppd X 35 years  Pipe  Cigars  Smokeless  Approximate date of last quit attempt:   Longest period of time quit in the past:She quit for on 2 occasions   What caused the relapse:Stress and life   Medication used in the past:  Nicotine patch  Nicotine gum  Nicotine lozenge  Nicotine nasal spray  Nicotine oral inhaler  Bupropion  Nortriptyline  Chantix  Readiness to quit: Interested in cutting back slowly to quitting in the near future  Other smokers in household: N  Plan: Quit date: TBD  Today, she is doing well.  She is interested in cutting back on her cigarette smoking to ultimately quit completely.  She has quit in the past but picks back up after her daughter passed away. Denies any neurologic complaints. Denies recent fevers or illnesses. Denies any easy bleeding or bruising.  Reports good appetite and denies weight loss. Denies chest pain. Denies any nausea, vomiting, constipation, or diarrhea. Denies urinary complaints. Patient offers no further specific complaints today.   ECOG FS:0 - Asymptomatic  Review of systems- Review of Systems  Constitutional: Negative.  Negative for chills, fever, malaise/fatigue and weight loss.  HENT: Negative for congestion, ear pain and tinnitus.   Eyes: Negative.  Negative for blurred vision and double vision.  Respiratory: Negative.  Negative for cough, sputum production and shortness of breath.   Cardiovascular: Negative.  Negative for chest pain, palpitations and leg swelling.  Gastrointestinal: Negative.  Negative for abdominal pain, constipation, diarrhea, nausea and vomiting.  Genitourinary: Negative for dysuria, frequency and urgency.  Musculoskeletal: Negative for back pain and falls.  Skin: Negative.  Negative for rash.  Neurological: Negative.  Negative for weakness and headaches.  Endo/Heme/Allergies: Negative.  Does not bruise/bleed easily.  Psychiatric/Behavioral: Negative.  Negative for depression. The patient is not nervous/anxious and does not have insomnia.     No Known Allergies   Past Medical History:  Diagnosis Date  . Anxiety   . Aortic atherosclerosis ([place]) 12/29/2017   Chest CT March 2019  . Cervical arthritis 09/04/2017   xrays Dec 2018  . Degenerative disc disease, cervical 09/04/2017   Xrays Dec 2018  . Depression   . Emphysema lung ([place]) 12/29/2017   Chest CT March 2019  . Hepatic steatosis 12/29/2017   Chest CT March 2019  . Hyperlipidemia   . Hypertension   . Prediabetes 03/14/2017     No  past surgical history on file.  Social History   Socioeconomic History  . Marital status: Widowed    Spouse name: Not on file  . Number of children: Not on file  . Years of education: Not on file  . Highest education level: Not on file  Occupational History  . Not on file  Tobacco Use  . Smoking  status: Current Every Day Smoker    Packs/day: 0.50    Years: 44.00    Pack years: 22.00    Types: Cigarettes    Start date: 08/30/2018  . Smokeless tobacco: Never Used  Vaping Use  . Vaping Use: Former  Substance and Sexual Activity  . Alcohol use: Yes    Alcohol/week: 5.0 standard drinks    Types: 1 Glasses of wine, 4 Cans of beer per week  . Drug use: No  . Sexual activity: Not Currently    Birth control/protection: Post-menopausal  Other Topics Concern  . Not on file  Social History Narrative  . Not on file   Social Determinants of Health   Financial Resource Strain:   . Difficulty of Paying Living Expenses:   Food Insecurity:   . Worried About Charity fundraiser in the Last Year:   . Arboriculturist in the Last Year:   Transportation Needs:   . Film/video editor (Medical):   Marland Kitchen Lack of Transportation (Non-Medical):   Physical Activity:   . Days of Exercise per Week:   . Minutes of Exercise per Session:   Stress:   . Feeling of Stress :   Social Connections:   . Frequency of Communication with Friends and Family:   . Frequency of Social Gatherings with Friends and Family:   . Attends Religious Services:   . Active Member of Clubs or Organizations:   . Attends Archivist Meetings:   Marland Kitchen Marital Status:   Intimate Partner Violence:   . Fear of Current or Ex-Partner:   . Emotionally Abused:   Marland Kitchen Physically Abused:   . Sexually Abused:     Family History  Problem Relation Age of Onset  . Dementia Mother   . Breast cancer Sister 63  . Aneurysm Father   . Heart attack Brother   . Heart defect Brother   . Brain cancer Daughter   . Heart attack Paternal Grandfather   . Drug abuse Sister      Current Outpatient Medications:  .  albuterol (VENTOLIN HFA) 108 (90 Base) MCG/ACT inhaler, Inhale 2 puffs into the lungs every 4 (four) hours as needed for wheezing or shortness of breath., Disp: 18 g, Rfl: 5 .  atorvastatin (LIPITOR) 80 MG tablet, Take 1  tablet (80 mg total) by mouth at bedtime., Disp: 90 tablet, Rfl: 1 .  buPROPion (WELLBUTRIN XL) 300 MG 24 hr tablet, Take 1 tablet by mouth once daily, Disp: 90 tablet, Rfl: 3 .  busPIRone (BUSPAR) 15 MG tablet, Take 1 tablet (15 mg total) by mouth 2 (two) times daily., Disp: 180 tablet, Rfl: 3 .  cetirizine (ZYRTEC) 10 MG tablet, Take 10 mg by mouth daily., Disp: , Rfl:  .  Fluticasone-Umeclidin-Vilant (TRELEGY ELLIPTA) 100-62.5-25 MCG/INH AEPB, Inhale 1 each into the lungs daily., Disp: 1 each, Rfl: 12 .  ibuprofen (ADVIL) 600 MG tablet, TAKE 1 TABLET BY MOUTH THREE TIMES DAILY, Disp: 30 tablet, Rfl: 0 .  Icosapent Ethyl (VASCEPA) 1 g CAPS, Take 2 capsules (2 g total) by mouth 2 (two) times daily., Disp: 360  capsule, Rfl: 3 .  ipratropium-albuterol (DUONEB) 0.5-2.5 (3) MG/3ML SOLN, Take 3 mLs by nebulization every 8 (eight) hours as needed., Disp: 180 mL, Rfl: 1 .  lisinopril (ZESTRIL) 5 MG tablet, Take 1 tablet (5 mg total) by mouth daily., Disp: 90 tablet, Rfl: 3 .  meloxicam (MOBIC) 15 MG tablet, Take 1 tablet (15 mg total) by mouth daily., Disp: 90 tablet, Rfl: 1 .  metFORMIN (GLUCOPHAGE) 1000 MG tablet, Take 1 tablet (1,000 mg total) by mouth 2 (two) times daily with a meal., Disp: 180 tablet, Rfl: 1 .  methocarbamol (ROBAXIN) 500 MG tablet, Take 1 tablet (500 mg total) by mouth 3 (three) times daily as needed for muscle spasms., Disp: 90 tablet, Rfl: 2 .  Multiple Vitamins-Minerals (MULTI COMPLETE PO), Take 1,000 Units by mouth daily., Disp: , Rfl:  .  nicotine (NICODERM CQ - DOSED IN MG/24 HOURS) 21 mg/24hr patch, Place 1 patch (21 mg total) onto the skin daily., Disp: 28 patch, Rfl: 0 .  nicotine (NICOTROL) 10 MG inhaler, Inhale 1 Cartridge (1 continuous puffing total) into the lungs as needed for smoking cessation., Disp: 42 each, Rfl: 0 .  nystatin (MYCOSTATIN/NYSTOP) powder, Apply topically 3 (three) times daily as needed., Disp: 15 g, Rfl: 2 .  pregabalin (LYRICA) 25 MG capsule, Take 1  capsule (25 mg total) by mouth 2 (two) times daily., Disp: 60 capsule, Rfl: 2 .  Turmeric 500 MG CAPS, Take 500 mg by mouth daily., Disp: , Rfl:   Physical exam: There were no vitals filed for this visit. Limited due to telephone visit  CMP Latest Ref Rng & Units 03/06/2020  Glucose 65 - 99 mg/dL 103(H)  BUN 7 - 25 mg/dL 7  Creatinine 0.50 - 0.99 mg/dL 0.69  Sodium 135 - 146 mmol/L 138  Potassium 3.5 - 5.3 mmol/L 5.3  Chloride 98 - 110 mmol/L 100  CO2 20 - 32 mmol/L 32  Calcium 8.6 - 10.4 mg/dL 9.4  Total Protein 6.1 - 8.1 g/dL 6.4  Total Bilirubin 0.2 - 1.2 mg/dL 0.3  Alkaline Phos 33 - 130 U/L -  AST 10 - 35 U/L 24  ALT 6 - 29 U/L 12   CBC Latest Ref Rng & Units 03/06/2020  WBC 3.8 - 10.8 Thousand/uL 8.2  Hemoglobin 11.7 - 15.5 g/dL 14.9  Hematocrit 35 - 45 % 44.5  Platelets 140 - 400 Thousand/uL 465(H)    No images are attached to the encounter.  No results found.   Assessment and plan- Patient is a 59 y.o. femalewho presents to smoking cessation clinic to discuss strategies, medications and interventions needed to quit smoking.  Topics covered: Tobacco improve having car Change in daily routines Dealing with urges to smoke Getting support Anticipating/avoiding triggers Secondhand smoke Behavioral skills Reinforced benefits  Reviewed pharmacotherapy: OTC medications include:  Nicotine replacement therapy (NRT) gum  NRT lozenge  NRT patch  NRT patch plus combination of gum or lozenge  Medical treatment:  NRT nasal spray: Dosing 1-2 doses per hour (8-40 doses per day); 1 dose equals 1 spray in each nostril; each spray level 0.5 mg of nicotine  NRT oral inhaler: Dosing 6-16 cartridges per day; initially use 1 cartridge every 1-2 hours  Bupropion SR: Dosing: Begin 1 to 2 weeks prior to quit date; 150 mg by mouth every a.m. x3 days, then increase to 150 mg by mouth twice daily.  Contraindications: Head injury, seizures, eating disorders, MAOI inhibitor.  Verenicline:  Dosing: Begin 1 week prior to quit  date; days 1 through 3 0.5 mg by mouth every a.m.; days 4 through 7; 0.5 mg p.o. twice daily; weeks 2 through 12: 1 mg p.o. twice daily.  Monitor for neuropsychiatric symptoms.  Follow-up plan:  Based on our discussion today, you have decided to initiate a nicotine inhaler and a nicotine patch 21 mg transdermal q. 3 days.  For the next 1 to 2 weeks we will attempt to decrease the amount of cigarettes she is smoking.  At that point, we will reassess and see about adding additional medications or manipulating the one she is currently on.  Disposition: RTC in 1-2 weeks to assess toleration of nicotine inhaler and nicotine patches.  Visit Diagnosis 1. Cigar smoker motivated to quit     Patient expressed understanding and was in agreement with this plan. She also understands that She can call clinic at any time with any questions, concerns, or complaints.   Greater than 50% was spent in counseling and coordination of care with this patient including but not limited to discussion of the relevant topics above (See A&P) including, but not limited to diagnosis and management of acute and chronic medical conditions.   Thank you for allowing me to participate in the care of this very pleasant patient.    Jacquelin Hawking, NP [place] at [facility] Cell - [phone] Pager- [phone] 04/24/2020 2:50 PM

## 2020-04-07 ENCOUNTER — Inpatient Hospital Stay: Payer: BLUE CROSS/BLUE SHIELD | Admitting: Oncology

## 2020-04-10 LAB — HM DIABETES EYE EXAM

## 2020-04-12 ENCOUNTER — Encounter: Payer: Self-pay | Admitting: Family Medicine

## 2020-04-14 ENCOUNTER — Other Ambulatory Visit: Payer: Self-pay | Admitting: Family Medicine

## 2020-04-14 DIAGNOSIS — F418 Other specified anxiety disorders: Secondary | ICD-10-CM

## 2020-04-14 DIAGNOSIS — Z87891 Personal history of nicotine dependence: Secondary | ICD-10-CM

## 2020-04-24 ENCOUNTER — Telehealth: Payer: Self-pay | Admitting: Oncology

## 2020-04-24 ENCOUNTER — Other Ambulatory Visit: Payer: Self-pay

## 2020-04-24 DIAGNOSIS — F411 Generalized anxiety disorder: Secondary | ICD-10-CM

## 2020-04-24 MED ORDER — BUSPIRONE HCL 15 MG PO TABS: 15.0000 mg | ORAL_TABLET | Freq: Two times a day (BID) | ORAL | 3 refills | Status: DC

## 2020-04-24 NOTE — Telephone Encounter (Signed)
Re: smoking cessation follow-up  Spoke with patient to assess toleration of nicotine inhaler and nicotine patches 21 mg daily. Patient states she was unable to tolerate nicotine inhaler because it made her "choke".  She has been using the patches as prescribed.  Is having trouble keeping them on.  Has decreased her cigarette smoking by about 8 to 10 cigarettes daily.  She is currently already on Wellbutrin for anxiety/depression.  Would like to continue with the patches at this time.  We will touch base in about 10 to 14 days to assess continued tolerance of nicotine patches.  We potentially could increase nicotine patch dose if she has continued decrease in cigarettes daily.  No refill needed today.   Faythe Casa, NP 04/24/2020 2:58 PM

## 2020-05-08 ENCOUNTER — Inpatient Hospital Stay: Payer: BLUE CROSS/BLUE SHIELD | Attending: Oncology | Admitting: Oncology

## 2020-05-08 DIAGNOSIS — F17210 Nicotine dependence, cigarettes, uncomplicated: Secondary | ICD-10-CM

## 2020-05-08 DIAGNOSIS — Z87891 Personal history of nicotine dependence: Secondary | ICD-10-CM

## 2020-05-08 MED ORDER — NICOTINE 21 MG/24HR TD PT24: 21.0000 mg | MEDICATED_PATCH | Freq: Every day | TRANSDERMAL | 0 refills | Status: DC

## 2020-05-08 NOTE — Progress Notes (Signed)
Smoking Cessation [facility]  [phone] [phone]  Patient Care Team: [name], Hershal Coria as PCP - General (Family Medicine)   Name of the patient: Savannah Washington  [id number]  [birth year]   Date of visit: 05/08/2020   Chief complaint/ Reason for visit- Smoking Cessation counseling Follow-up visit   PMH-Mrs. Aday is a 59 yo female with pmh significant for hypertension, atherosclerosis, COPD, hepatic steatosis, type 2 diabetes, degenerative disc disease hyperlipidemia tobacco abuse for many years who is interested in quitting smoking.  Initial smoking cessation visit was on 04/04/2020.  She was given nicotine patch 21 mg daily and a nicotine inhaler.  Follow-up was on 04/24/2020.  She was unable to tolerate nicotine inhaler.  She had decreased her cigarettes by 8 to 10 cigarettes daily.  Interval history-reports no change in the amount of cigarettes she is smoking.  She continues to smoke half a pack of cigarettes daily.  She has made small adjustments to her daily routine.  She no longer carries her cigarettes around with her to try and slow the amount of cigarettes she is smoking she reports no significant side effects from her patch.  She is also exercising.  Smoking history:   Tobacco Use: High Risk  . Smoking Tobacco Use: Current Every Day Smoker  . Smokeless Tobacco Use: Never Used     Type of tobacco:   Cigarettes- 1-2ppd X 35 years  Approximate date of last quit attempt:6 years ago   Longest period of time quit in the past:She quit for on 2 occasions   What caused the relapse:Stress and life   Medication used in the past:  Nicotine patch  Nicotine gum  Chantix  Readiness to quit: Interested in cutting back slowly to quitting in the near future  Other smokers in household: N  Plan: Quit date: TBD  ECOG FS:0 - Asymptomatic  Review of systems- Review of Systems  Constitutional: Negative.  Negative for  chills, fever, malaise/fatigue and weight loss.  HENT: Negative for congestion, ear pain and tinnitus.   Eyes: Negative.  Negative for blurred vision and double vision.  Respiratory: Negative.  Negative for cough, sputum production and shortness of breath.   Cardiovascular: Negative.  Negative for chest pain, palpitations and leg swelling.  Gastrointestinal: Negative.  Negative for abdominal pain, constipation, diarrhea, nausea and vomiting.  Genitourinary: Negative for dysuria, frequency and urgency.  Musculoskeletal: Negative for back pain and falls.  Skin: Negative.  Negative for rash.  Neurological: Negative.  Negative for weakness and headaches.  Endo/Heme/Allergies: Negative.  Does not bruise/bleed easily.  Psychiatric/Behavioral: Negative.  Negative for depression. The patient is not nervous/anxious and does not have insomnia.     No Known Allergies   Past Medical History:  Diagnosis Date  . Anxiety   . Aortic atherosclerosis ([place]) 12/29/2017   Chest CT March 2019  . Cervical arthritis 09/04/2017   xrays Dec 2018  . Degenerative disc disease, cervical 09/04/2017   Xrays Dec 2018  . Depression   . Emphysema lung ([place]) 12/29/2017   Chest CT March 2019  . Hepatic steatosis 12/29/2017   Chest CT March 2019  . Hyperlipidemia   . Hypertension   . Prediabetes 03/14/2017     No past surgical history on file.  Social History   Socioeconomic History  . Marital status: Widowed    Spouse name: Not on file  . Number of children: Not on file  . Years of education: Not  on file  . Highest education level: Not on file  Occupational History  . Not on file  Tobacco Use  . Smoking status: Current Every Day Smoker    Packs/day: 0.50    Years: 44.00    Pack years: 22.00    Types: Cigarettes    Start date: 08/30/2018  . Smokeless tobacco: Never Used  Vaping Use  . Vaping Use: Former  Substance and Sexual Activity  . Alcohol use: Yes    Alcohol/week: 5.0 standard drinks    Types: 1  Glasses of wine, 4 Cans of beer per week  . Drug use: No  . Sexual activity: Not Currently    Birth control/protection: Post-menopausal  Other Topics Concern  . Not on file  Social History Narrative  . Not on file   Social Determinants of Health   Financial Resource Strain:   . Difficulty of Paying Living Expenses:   Food Insecurity:   . Worried About Charity fundraiser in the Last Year:   . Arboriculturist in the Last Year:   Transportation Needs:   . Film/video editor (Medical):   Marland Kitchen Lack of Transportation (Non-Medical):   Physical Activity:   . Days of Exercise per Week:   . Minutes of Exercise per Session:   Stress:   . Feeling of Stress :   Social Connections:   . Frequency of Communication with Friends and Family:   . Frequency of Social Gatherings with Friends and Family:   . Attends Religious Services:   . Active Member of Clubs or Organizations:   . Attends Archivist Meetings:   Marland Kitchen Marital Status:   Intimate Partner Violence:   . Fear of Current or Ex-Partner:   . Emotionally Abused:   Marland Kitchen Physically Abused:   . Sexually Abused:     Family History  Problem Relation Age of Onset  . Dementia Mother   . Breast cancer Sister 45  . Aneurysm Father   . Heart attack Brother   . Heart defect Brother   . Brain cancer Daughter   . Heart attack Paternal Grandfather   . Drug abuse Sister      Current Outpatient Medications:  .  albuterol (VENTOLIN HFA) 108 (90 Base) MCG/ACT inhaler, Inhale 2 puffs into the lungs every 4 (four) hours as needed for wheezing or shortness of breath., Disp: 18 g, Rfl: 5 .  atorvastatin (LIPITOR) 80 MG tablet, Take 1 tablet (80 mg total) by mouth at bedtime., Disp: 90 tablet, Rfl: 1 .  buPROPion (WELLBUTRIN XL) 300 MG 24 hr tablet, Take 1 tablet by mouth once daily, Disp: 90 tablet, Rfl: 3 .  busPIRone (BUSPAR) 15 MG tablet, Take 1 tablet (15 mg total) by mouth 2 (two) times daily., Disp: 180 tablet, Rfl: 3 .  cetirizine  (ZYRTEC) 10 MG tablet, Take 10 mg by mouth daily., Disp: , Rfl:  .  Fluticasone-Umeclidin-Vilant (TRELEGY ELLIPTA) 100-62.5-25 MCG/INH AEPB, Inhale 1 each into the lungs daily., Disp: 1 each, Rfl: 12 .  ibuprofen (ADVIL) 600 MG tablet, TAKE 1 TABLET BY MOUTH THREE TIMES DAILY, Disp: 30 tablet, Rfl: 0 .  Icosapent Ethyl (VASCEPA) 1 g CAPS, Take 2 capsules (2 g total) by mouth 2 (two) times daily., Disp: 360 capsule, Rfl: 3 .  ipratropium-albuterol (DUONEB) 0.5-2.5 (3) MG/3ML SOLN, Take 3 mLs by nebulization every 8 (eight) hours as needed., Disp: 180 mL, Rfl: 1 .  lisinopril (ZESTRIL) 5 MG tablet, Take 1 tablet (5 mg  total) by mouth daily., Disp: 90 tablet, Rfl: 3 .  meloxicam (MOBIC) 15 MG tablet, Take 1 tablet (15 mg total) by mouth daily., Disp: 90 tablet, Rfl: 1 .  metFORMIN (GLUCOPHAGE) 1000 MG tablet, Take 1 tablet (1,000 mg total) by mouth 2 (two) times daily with a meal., Disp: 180 tablet, Rfl: 1 .  methocarbamol (ROBAXIN) 500 MG tablet, Take 1 tablet (500 mg total) by mouth 3 (three) times daily as needed for muscle spasms., Disp: 90 tablet, Rfl: 2 .  Multiple Vitamins-Minerals (MULTI COMPLETE PO), Take 1,000 Units by mouth daily., Disp: , Rfl:  .  nicotine (NICODERM CQ - DOSED IN MG/24 HOURS) 21 mg/24hr patch, Place 1 patch (21 mg total) onto the skin daily., Disp: 28 patch, Rfl: 0 .  nystatin (MYCOSTATIN/NYSTOP) powder, Apply topically 3 (three) times daily as needed., Disp: 15 g, Rfl: 2 .  pregabalin (LYRICA) 25 MG capsule, Take 1 capsule (25 mg total) by mouth 2 (two) times daily., Disp: 60 capsule, Rfl: 2 .  Turmeric 500 MG CAPS, Take 500 mg by mouth daily., Disp: , Rfl:   Physical exam: There were no vitals filed for this visit. Limited due to telephone visit  CMP Latest Ref Rng & Units 03/06/2020  Glucose 65 - 99 mg/dL 103(H)  BUN 7 - 25 mg/dL 7  Creatinine 0.50 - 0.99 mg/dL 0.69  Sodium 135 - 146 mmol/L 138  Potassium 3.5 - 5.3 mmol/L 5.3  Chloride 98 - 110 mmol/L 100  CO2 20 -  32 mmol/L 32  Calcium 8.6 - 10.4 mg/dL 9.4  Total Protein 6.1 - 8.1 g/dL 6.4  Total Bilirubin 0.2 - 1.2 mg/dL 0.3  Alkaline Phos 33 - 130 U/L -  AST 10 - 35 U/L 24  ALT 6 - 29 U/L 12   CBC Latest Ref Rng & Units 03/06/2020  WBC 3.8 - 10.8 Thousand/uL 8.2  Hemoglobin 11.7 - 15.5 g/dL 14.9  Hematocrit 35 - 45 % 44.5  Platelets 140 - 400 Thousand/uL 465(H)    No images are attached to the encounter.  No results found.   Assessment and plan- Patient is a 59 y.o. femalewho presents for follow-up.    Follow-up plan: Continue nicotine patch 21 mg daily.  Add an additional OTC medication for breakthrough cravings such as nicotine gum, lozenges, inhaler or nasal spray.  New Rx sent.  She is not interested in trying Chantix.  She is currently already on Wellbutrin 300 mg tablets daily.  Can trial nortriptyline in the future.  Will discuss at next visit.  Disposition: RTC in 1 month for F/u   Visit Diagnosis 1. Personal history of tobacco use, presenting hazards to health     Patient expressed understanding and was in agreement with this plan. She also understands that She can call clinic at any time with any questions, concerns, or complaints.   I provided 15 minutes of non face-to-face telephone visit time during this encounter, and > 50% was spent counseling as documented under my assessment & plan.  Thank you for allowing me to participate in the care of this very pleasant patient.    Jacquelin Hawking, NP [place] at [facility] Cell - [phone] Pager- [phone] 05/08/2020 4:02 PM

## 2020-05-14 ENCOUNTER — Encounter: Payer: Self-pay | Admitting: Family Medicine

## 2020-05-15 MED ORDER — NYSTATIN 100000 UNIT/GM EX POWD: Freq: Three times a day (TID) | CUTANEOUS | 2 refills | Status: DC | PRN

## 2020-05-19 ENCOUNTER — Other Ambulatory Visit: Payer: Self-pay | Admitting: Family Medicine

## 2020-05-19 DIAGNOSIS — R7303 Prediabetes: Secondary | ICD-10-CM

## 2020-05-22 ENCOUNTER — Ambulatory Visit: Payer: BLUE CROSS/BLUE SHIELD | Admitting: Family Medicine

## 2020-05-22 NOTE — Progress Notes (Deleted)
Patient ID: Savannah Washington, female    DOB: Mar 01, 1961, 59 y.o.   MRN: [id number]  PCP: [name], PA-C  No chief complaint on file.   Subjective:   Savannah Washington is a 59 y.o. female, presents to clinic with CC of the following: rash on foot  HPI    Patient Active Problem List   Diagnosis Date Noted   Type 2 diabetes mellitus with diabetic polyneuropathy, without long-term current use of insulin ([place]) 02/04/2020   Tobacco abuse 12/01/2018   Aortic atherosclerosis ([place]) 12/29/2017   Hepatic steatosis 12/29/2017   COPD (chronic obstructive pulmonary disease) ([place]) 12/05/2017   Degenerative disc disease, cervical 09/04/2017   Cervical arthritis 09/04/2017   Muscle spasm of left shoulder area 09/03/2017   Elevated platelet count 04/11/2017   Marijuana use 03/17/2017   Prediabetes 03/14/2017   Essential hypertension, benign 03/14/2017   Hyperlipidemia 03/14/2017   Anxiety disorder 03/14/2017      Current Outpatient Medications:    albuterol (VENTOLIN HFA) 108 (90 Base) MCG/ACT inhaler, Inhale 2 puffs into the lungs every 4 (four) hours as needed for wheezing or shortness of breath., Disp: 18 g, Rfl: 5   atorvastatin (LIPITOR) 80 MG tablet, Take 1 tablet (80 mg total) by mouth at bedtime., Disp: 90 tablet, Rfl: 1   buPROPion (WELLBUTRIN XL) 300 MG 24 hr tablet, Take 1 tablet by mouth once daily, Disp: 90 tablet, Rfl: 3   busPIRone (BUSPAR) 15 MG tablet, Take 1 tablet (15 mg total) by mouth 2 (two) times daily., Disp: 180 tablet, Rfl: 3   cetirizine (ZYRTEC) 10 MG tablet, Take 10 mg by mouth daily., Disp: , Rfl:    Fluticasone-Umeclidin-Vilant (TRELEGY ELLIPTA) 100-62.5-25 MCG/INH AEPB, Inhale 1 each into the lungs daily., Disp: 1 each, Rfl: 12   ibuprofen (ADVIL) 600 MG tablet, TAKE 1 TABLET BY MOUTH THREE TIMES DAILY, Disp: 30 tablet, Rfl: 0   Icosapent Ethyl (VASCEPA) 1 g CAPS, Take 2 capsules (2 g total) by mouth 2 (two) times daily., Disp: 360  capsule, Rfl: 3   ipratropium-albuterol (DUONEB) 0.5-2.5 (3) MG/3ML SOLN, Take 3 mLs by nebulization every 8 (eight) hours as needed., Disp: 180 mL, Rfl: 1   lisinopril (ZESTRIL) 5 MG tablet, Take 1 tablet (5 mg total) by mouth daily., Disp: 90 tablet, Rfl: 3   meloxicam (MOBIC) 15 MG tablet, Take 1 tablet (15 mg total) by mouth daily., Disp: 90 tablet, Rfl: 1   metFORMIN (GLUCOPHAGE) 1000 MG tablet, TAKE 1 TABLET BY MOUTH TWICE DAILY WITH A MEAL, Disp: 180 tablet, Rfl: 3   methocarbamol (ROBAXIN) 500 MG tablet, Take 1 tablet (500 mg total) by mouth 3 (three) times daily as needed for muscle spasms., Disp: 90 tablet, Rfl: 2   Multiple Vitamins-Minerals (MULTI COMPLETE PO), Take 1,000 Units by mouth daily., Disp: , Rfl:    nicotine (NICODERM CQ - DOSED IN MG/24 HOURS) 21 mg/24hr patch, Place 1 patch (21 mg total) onto the skin daily., Disp: 28 patch, Rfl: 0   nystatin (MYCOSTATIN/NYSTOP) powder, Apply topically 3 (three) times daily as needed., Disp: 15 g, Rfl: 2   pregabalin (LYRICA) 25 MG capsule, Take 1 capsule (25 mg total) by mouth 2 (two) times daily., Disp: 60 capsule, Rfl: 2   Turmeric 500 MG CAPS, Take 500 mg by mouth daily., Disp: , Rfl:    No Known Allergies   Social History   Tobacco Use   Smoking status: Current Every Day Smoker    Packs/day: 0.50  Years: 44.00    Pack years: 22.00    Types: Cigarettes    Start date: 08/30/2018   Smokeless tobacco: Never Used  Vaping Use   Vaping Use: Former  Substance Use Topics   Alcohol use: Yes    Alcohol/week: 5.0 standard drinks    Types: 1 Glasses of wine, 4 Cans of beer per week   Drug use: No      Chart Review Today: ***  Review of Systems     Objective:   There were no vitals filed for this visit.  There is no height or weight on file to calculate BMI.  Physical Exam   Results for orders placed or performed in visit on 04/12/20  HM DIABETES EYE EXAM  Result Value Ref Range   HM Diabetic Eye  Exam No Retinopathy No Retinopathy       Assessment & Plan:   Hollie Salk, RMA 05/22/20 12:09 PM

## 2020-06-08 ENCOUNTER — Other Ambulatory Visit: Payer: Self-pay

## 2020-06-08 ENCOUNTER — Encounter: Payer: Self-pay | Admitting: Family Medicine

## 2020-06-08 ENCOUNTER — Inpatient Hospital Stay: Payer: BLUE CROSS/BLUE SHIELD | Admitting: Oncology

## 2020-06-09 NOTE — Progress Notes (Signed)
Name: Savannah Washington   MRN: [id number]    DOB: 05/17/1961   Date:06/12/2020       Progress Note  Chief Complaint  Patient presents with  . Follow-up  . Diabetes  . Hypertension  . Hyperlipidemia     Subjective:   Savannah Washington is a 59 y.o. female, presents to clinic for routine f/up   DM:   Pt managing DM with metformin 1000mg  BID Reports good med compliance Pt has denies SE from meds. Blood sugars - not monitoring Denies: Polyuria, polydipsia, vision changes, neuropathy, hypoglycemia Recent pertinent labs: Lab Results  Component Value Date   HGBA1C 5.7 (H) 03/06/2020   HGBA1C 6.2 (H) 12/14/2018   HGBA1C 6.2 (H) 03/06/2018   Standard of care and health maintenance: Urine Microalbumin:  03/06/2020 Foot exam:  02/04/2020 DM eye exam:  04/10/2020 ACEI/ARB: Lisinopril  Statin:  Lipitor 80 mg qd   Hypertension:  Currently managed on Lisinopril 5mg  qd Pt reports good med compliance and denies any SE.   Blood pressure today is well controlled. BP Readings from Last 3 Encounters:  06/12/20 118/76  03/06/20 (!) 132/94  03/01/20 118/78   Pt denies CP, SOB, exertional sx, LE edema, palpitation, Ha's, visual disturbances, lightheadedness, hypotension, syncope.  Hyperlipidemia: Currently treated with Lipitor 80 mg qd and Vascepa, pt reports  med compliance Last Lipids: Lab Results  Component Value Date   CHOL 175 03/06/2020   HDL 61 03/06/2020   LDLCALC 86 03/06/2020   TRIG 183 (H) 03/06/2020   CHOLHDL 2.9 03/06/2020   - Denies: Chest pain, shortness of breath (no change from baseline COPD sx), myalgias, claudication  She is worried that she's  Feeling more depressed than normal  She is on Wellbutrin 300 mg 24-hour tablet once a day and BuSpar as needed Depression screen [facility] 2/9 06/12/2020 03/01/2020 02/10/2020  Decreased Interest 0 0 0  Down, Depressed, Hopeless 0 1 0  PHQ - 2 Score 0 1 0  Altered sleeping - 0 1  Tired, decreased energy - 1 0  Change in appetite  - 0 0  Feeling bad or failure about yourself  - 0 0  Trouble concentrating - 0 0  Moving slowly or fidgety/restless - 0 0  Suicidal thoughts - 0 0  PHQ-9 Score - 2 1  Difficult doing work/chores - Not difficult at all Not difficult at all  Some recent data might be hidden    COPD managed on Trelegy daily and albuterol as needed, current smoker, does want to try and quit inquires about nicotine patches No recent change or worsening to her baseline respiratory symptoms Current smoker smokes about 1/2 pack/day would like to stop smoking, discuss smoking cessation and tobacco counseling today  Chronic pain syndrome with cervical DDD, fibromyalgia, managed with muscle relaxers, Lyrica, Mobic     Current Outpatient Medications:  .  albuterol (VENTOLIN HFA) 108 (90 Base) MCG/ACT inhaler, Inhale 2 puffs into the lungs every 4 (four) hours as needed for wheezing or shortness of breath., Disp: 18 g, Rfl: 5 .  atorvastatin (LIPITOR) 80 MG tablet, Take 1 tablet (80 mg total) by mouth at bedtime., Disp: 90 tablet, Rfl: 1 .  buPROPion (WELLBUTRIN XL) 300 MG 24 hr tablet, Take 1 tablet by mouth once daily, Disp: 90 tablet, Rfl: 3 .  busPIRone (BUSPAR) 15 MG tablet, Take 1 tablet (15 mg total) by mouth 2 (two) times daily., Disp: 180 tablet, Rfl: 3 .  cetirizine (ZYRTEC) 10 MG tablet, Take  10 mg by mouth daily., Disp: , Rfl:  .  Fluticasone-Umeclidin-Vilant (TRELEGY ELLIPTA) 100-62.5-25 MCG/INH AEPB, Inhale 1 each into the lungs daily., Disp: 1 each, Rfl: 12 .  ibuprofen (ADVIL) 600 MG tablet, TAKE 1 TABLET BY MOUTH THREE TIMES DAILY, Disp: 30 tablet, Rfl: 0 .  Icosapent Ethyl (VASCEPA) 1 g CAPS, Take 2 capsules (2 g total) by mouth 2 (two) times daily., Disp: 360 capsule, Rfl: 3 .  ipratropium-albuterol (DUONEB) 0.5-2.5 (3) MG/3ML SOLN, Take 3 mLs by nebulization every 8 (eight) hours as needed., Disp: 180 mL, Rfl: 1 .  lisinopril (ZESTRIL) 5 MG tablet, Take 1 tablet (5 mg total) by mouth daily., Disp:  90 tablet, Rfl: 3 .  meloxicam (MOBIC) 15 MG tablet, Take 1 tablet (15 mg total) by mouth daily., Disp: 90 tablet, Rfl: 1 .  metFORMIN (GLUCOPHAGE) 1000 MG tablet, TAKE 1 TABLET BY MOUTH TWICE DAILY WITH A MEAL, Disp: 180 tablet, Rfl: 3 .  methocarbamol (ROBAXIN) 500 MG tablet, Take 1 tablet (500 mg total) by mouth 3 (three) times daily as needed for muscle spasms., Disp: 90 tablet, Rfl: 2 .  Multiple Vitamins-Minerals (MULTI COMPLETE PO), Take 1,000 Units by mouth daily., Disp: , Rfl:  .  nicotine (NICODERM CQ - DOSED IN MG/24 HOURS) 21 mg/24hr patch, Place 1 patch (21 mg total) onto the skin daily., Disp: 28 patch, Rfl: 0 .  nystatin (MYCOSTATIN/NYSTOP) powder, Apply topically 3 (three) times daily as needed., Disp: 15 g, Rfl: 2 .  pregabalin (LYRICA) 25 MG capsule, Take 1 capsule (25 mg total) by mouth 2 (two) times daily., Disp: 60 capsule, Rfl: 2 .  Turmeric 500 MG CAPS, Take 500 mg by mouth daily., Disp: , Rfl:   Patient Active Problem List   Diagnosis Date Noted  . Type 2 diabetes mellitus with diabetic polyneuropathy, without long-term current use of insulin ([place]) 02/04/2020  . Tobacco abuse 12/01/2018  . Aortic atherosclerosis ([place]) 12/29/2017  . Hepatic steatosis 12/29/2017  . COPD (chronic obstructive pulmonary disease) ([place]) 12/05/2017  . Degenerative disc disease, cervical 09/04/2017  . Cervical arthritis 09/04/2017  . Muscle spasm of left shoulder area 09/03/2017  . Elevated platelet count 04/11/2017  . Marijuana use 03/17/2017  . Prediabetes 03/14/2017  . Essential hypertension, benign 03/14/2017  . Hyperlipidemia 03/14/2017  . Anxiety disorder 03/14/2017    No past surgical history on file.  Family History  Problem Relation Age of Onset  . Dementia Mother   . Breast cancer Sister 72  . Aneurysm Father   . Heart attack Brother   . Heart defect Brother   . Brain cancer Daughter   . Heart attack Paternal Grandfather   . Drug abuse Sister     Social History    Tobacco Use  . Smoking status: Current Every Day Smoker    Packs/day: 0.50    Years: 44.00    Pack years: 22.00    Types: Cigarettes    Start date: 08/30/2018  . Smokeless tobacco: Never Used  Vaping Use  . Vaping Use: Former  Substance Use Topics  . Alcohol use: Yes    Alcohol/week: 5.0 standard drinks    Types: 1 Glasses of wine, 4 Cans of beer per week  . Drug use: No     No Known Allergies  Health Maintenance  Topic Date Due  . COVID-19 Vaccine (1) Never done  . MAMMOGRAM  05/23/2019  . INFLUENZA VACCINE  04/30/2020  . TETANUS/TDAP  10/05/2020 (Originally 01/29/1979)  . Hepatitis C  Screening  02/03/2021 (Originally 1961-07-07)  . HIV Screening  02/03/2021 (Originally 01/29/1975)  . HEMOGLOBIN A1C  09/05/2020  . FOOT EXAM  02/03/2021  . OPHTHALMOLOGY EXAM  04/10/2021  . PAP SMEAR-Modifier  05/08/2021  . COLONOSCOPY  09/30/2021  . PNEUMOCOCCAL POLYSACCHARIDE VACCINE AGE 1-64 HIGH RISK  Completed    Chart Review Today: I personally reviewed active problem list, medication list, allergies, family history, social history, health maintenance, notes from last encounter, lab results, imaging with the patient/caregiver today.   Review of Systems  10 Systems reviewed and are negative for acute change except as noted in the HPI.  Objective:   Vitals:   06/12/20 1036  BP: 118/76  Pulse: 87  Resp: 16  Temp: 97.9 F (36.6 C)  TempSrc: Oral  SpO2: 94%  Weight: 138 lb 14.4 oz (63 kg)  Height: 5\' 2"  (1.575 m)    Body mass index is 25.41 kg/m.  Physical Exam Vitals and nursing note reviewed.  Constitutional:      General: She is not in acute distress.    Appearance: Normal appearance. She is not ill-appearing, toxic-appearing or diaphoretic.  HENT:     Head: Normocephalic and atraumatic.     Right Ear: External ear normal.     Left Ear: External ear normal.  Eyes:     General: No scleral icterus.       Right eye: No discharge.        Left eye: No discharge.      Conjunctiva/sclera: Conjunctivae normal.  Cardiovascular:     Rate and Rhythm: Normal rate and regular rhythm.     Pulses: Normal pulses.     Heart sounds: Normal heart sounds.  Pulmonary:     Effort: Pulmonary effort is normal. No respiratory distress.     Breath sounds: Normal breath sounds. No wheezing, rhonchi or rales.  Abdominal:     General: Bowel sounds are normal. There is no distension.     Palpations: Abdomen is soft.     Tenderness: There is no abdominal tenderness.  Musculoskeletal:     Right lower leg: No edema.     Left lower leg: No edema.  Skin:    General: Skin is warm and dry.     Coloration: Skin is not jaundiced or pale.     Findings: Rash present.  Neurological:     Mental Status: She is alert. Mental status is at baseline.  Psychiatric:        Mood and Affect: Mood normal.        Behavior: Behavior normal.         Assessment & Plan:     ICD-10-CM   1. Type 2 diabetes mellitus with diabetic polyneuropathy, without long-term current use of insulin (HCC)  E11.42 Hemoglobin A1C    COMPLETE METABOLIC PANEL WITH GFR   Has been well controlled on Metformin, due for repeat A1c, continue diet lifestyle efforts and Metformin  2. Mixed hyperlipidemia  E78.2 atorvastatin (LIPITOR) 80 MG tablet    COMPLETE METABOLIC PANEL WITH GFR    icosapent Ethyl (VASCEPA) 1 g capsule   Good med compliance with statin and Vascepa working on diet and lifestyle, no med side effects or concerns, labs at goal 3 months ago we will repeat annually  3. Essential hypertension, benign  H85 COMPLETE METABOLIC PANEL WITH GFR   Stable, well-controlled blood pressure on lisinopril 5 mg continue DASH diet, lifestyle efforts and exercise as able  4. Aortic atherosclerosis (HCC)  I70.0  atorvastatin (LIPITOR) 80 MG tablet    COMPLETE METABOLIC PANEL WITH GFR    icosapent Ethyl (VASCEPA) 1 g capsule   On statin, monitoring  5. Need for influenza vaccination  Z23 Flu Vaccine QUAD 6+ mos PF IM  (Fluarix Quad PF)  6. Chronic obstructive pulmonary disease, unspecified COPD type ([place])  J44.9 Fluticasone-Umeclidin-Vilant (TRELEGY ELLIPTA) 100-62.5-25 MCG/INH AEPB   Symptoms well controlled with Trelegy and albuterol as needed history of asthma and COPD with current smoker, flares with viral illnesses and allergies  7. Intertriginous candidiasis  B37.2 nystatin (MYCOSTATIN/NYSTOP) powder   Patient is prone to rashes in her skin folds that become raw itching and burning she is asked for refill on nystatin powder  8. Encounter for medication monitoring  Z51.81 Hemoglobin A1C    atorvastatin (LIPITOR) 80 MG tablet    COMPLETE METABOLIC PANEL WITH GFR    icosapent Ethyl (VASCEPA) 1 g capsule    Fluticasone-Umeclidin-Vilant (TRELEGY ELLIPTA) 100-62.5-25 MCG/INH AEPB  9. Tinea pedis of both feet  B35.3    Evidence of tinea pedis bilaterally encouraged athlete's foot treatment, powders, keeping feet dry and aired out     Return in about 6 months (around 12/10/2020) for Routine follow-up.   [name], PA-C 06/12/20 10:43 AM

## 2020-06-12 ENCOUNTER — Other Ambulatory Visit: Payer: Self-pay

## 2020-06-12 ENCOUNTER — Ambulatory Visit: Payer: BLUE CROSS/BLUE SHIELD | Admitting: Family Medicine

## 2020-06-12 ENCOUNTER — Encounter: Payer: Self-pay | Admitting: Family Medicine

## 2020-06-12 VITALS — BP 118/76 | HR 87 | Temp 97.9°F | Resp 16 | Ht 62.0 in | Wt 138.9 lb

## 2020-06-12 DIAGNOSIS — I10 Essential (primary) hypertension: Secondary | ICD-10-CM

## 2020-06-12 DIAGNOSIS — Z23 Encounter for immunization: Secondary | ICD-10-CM | POA: Diagnosis not present

## 2020-06-12 DIAGNOSIS — I700 Atherosclerosis of aorta: Secondary | ICD-10-CM | POA: Diagnosis not present

## 2020-06-12 DIAGNOSIS — E1142 Type 2 diabetes mellitus with diabetic polyneuropathy: Secondary | ICD-10-CM | POA: Diagnosis not present

## 2020-06-12 DIAGNOSIS — E782 Mixed hyperlipidemia: Secondary | ICD-10-CM

## 2020-06-12 DIAGNOSIS — J449 Chronic obstructive pulmonary disease, unspecified: Secondary | ICD-10-CM

## 2020-06-12 DIAGNOSIS — B353 Tinea pedis: Secondary | ICD-10-CM

## 2020-06-12 DIAGNOSIS — B372 Candidiasis of skin and nail: Secondary | ICD-10-CM

## 2020-06-12 DIAGNOSIS — Z5181 Encounter for therapeutic drug level monitoring: Secondary | ICD-10-CM

## 2020-06-12 MED ORDER — ICOSAPENT ETHYL 1 G PO CAPS: 2.0000 g | ORAL_CAPSULE | Freq: Two times a day (BID) | ORAL | 3 refills | Status: DC

## 2020-06-12 MED ORDER — TRELEGY ELLIPTA 100-62.5-25 MCG/INH IN AEPB: 1.0000 | INHALATION_SPRAY | Freq: Every day | RESPIRATORY_TRACT | 12 refills | Status: DC

## 2020-06-12 MED ORDER — PREGABALIN 25 MG PO CAPS: 25.0000 mg | ORAL_CAPSULE | Freq: Two times a day (BID) | ORAL | 2 refills | Status: DC

## 2020-06-12 MED ORDER — ATORVASTATIN CALCIUM 80 MG PO TABS: 80.0000 mg | ORAL_TABLET | Freq: Every day | ORAL | 3 refills | Status: DC

## 2020-06-12 MED ORDER — NYSTATIN 100000 UNIT/GM EX POWD: Freq: Three times a day (TID) | CUTANEOUS | 2 refills | Status: DC | PRN

## 2020-06-12 NOTE — Patient Instructions (Signed)
Check your meds at home and compare to the med list on paperwork today and let me know if you were out of anything.

## 2020-06-13 ENCOUNTER — Encounter: Payer: Self-pay | Admitting: Family Medicine

## 2020-06-13 LAB — COMPLETE METABOLIC PANEL WITH GFR
AG Ratio: 2.2 (calc) (ref 1.0–2.5)
ALT: 12 U/L (ref 6–29)
AST: 26 U/L (ref 10–35)
Albumin: 4.2 g/dL (ref 3.6–5.1)
Alkaline phosphatase (APISO): 55 U/L (ref 37–153)
BUN/Creatinine Ratio: 7 (calc) (ref 6–22)
BUN: 5 mg/dL — ABNORMAL LOW (ref 7–25)
CO2: 29 mmol/L (ref 20–32)
Calcium: 9 mg/dL (ref 8.6–10.4)
Chloride: 103 mmol/L (ref 98–110)
Creat: 0.72 mg/dL (ref 0.50–0.99)
GFR, Est African American: 105 mL/min/{1.73_m2} (ref >=60)
GFR, Est Non African American: 91 mL/min/{1.73_m2} (ref >=60)
Globulin: 1.9 g/dL (calc) (ref 1.9–3.7)
Glucose, Bld: 136 mg/dL — ABNORMAL HIGH (ref 65–99)
Potassium: 4.8 mmol/L (ref 3.5–5.3)
Sodium: 141 mmol/L (ref 135–146)
Total Bilirubin: 0.4 mg/dL (ref 0.2–1.2)
Total Protein: 6.1 g/dL (ref 6.1–8.1)

## 2020-06-13 LAB — HEMOGLOBIN A1C
Hgb A1c MFr Bld: 5.6 % of total Hgb (ref <5.7)
Mean Plasma Glucose: 114 (calc)
eAG (mmol/L): 6.3 (calc)

## 2020-07-18 ENCOUNTER — Encounter: Payer: Self-pay | Admitting: Family Medicine

## 2020-09-05 ENCOUNTER — Encounter: Payer: Self-pay | Admitting: Family Medicine

## 2020-09-13 ENCOUNTER — Other Ambulatory Visit: Payer: Self-pay | Admitting: Family Medicine

## 2020-10-13 IMAGING — CR DG CHEST 2V
1 series · 2 of 2 positions shown · non-contrast
Comparison: 08/20/2019

CLINICAL DATA: Left-sided rib pain following contusion several days
ago

EXAM:
CHEST - 2 VIEW

[Series 1: dg chest 2 view · 0.14mm/px · 2 of 2 slices shown]
[im 1/2]
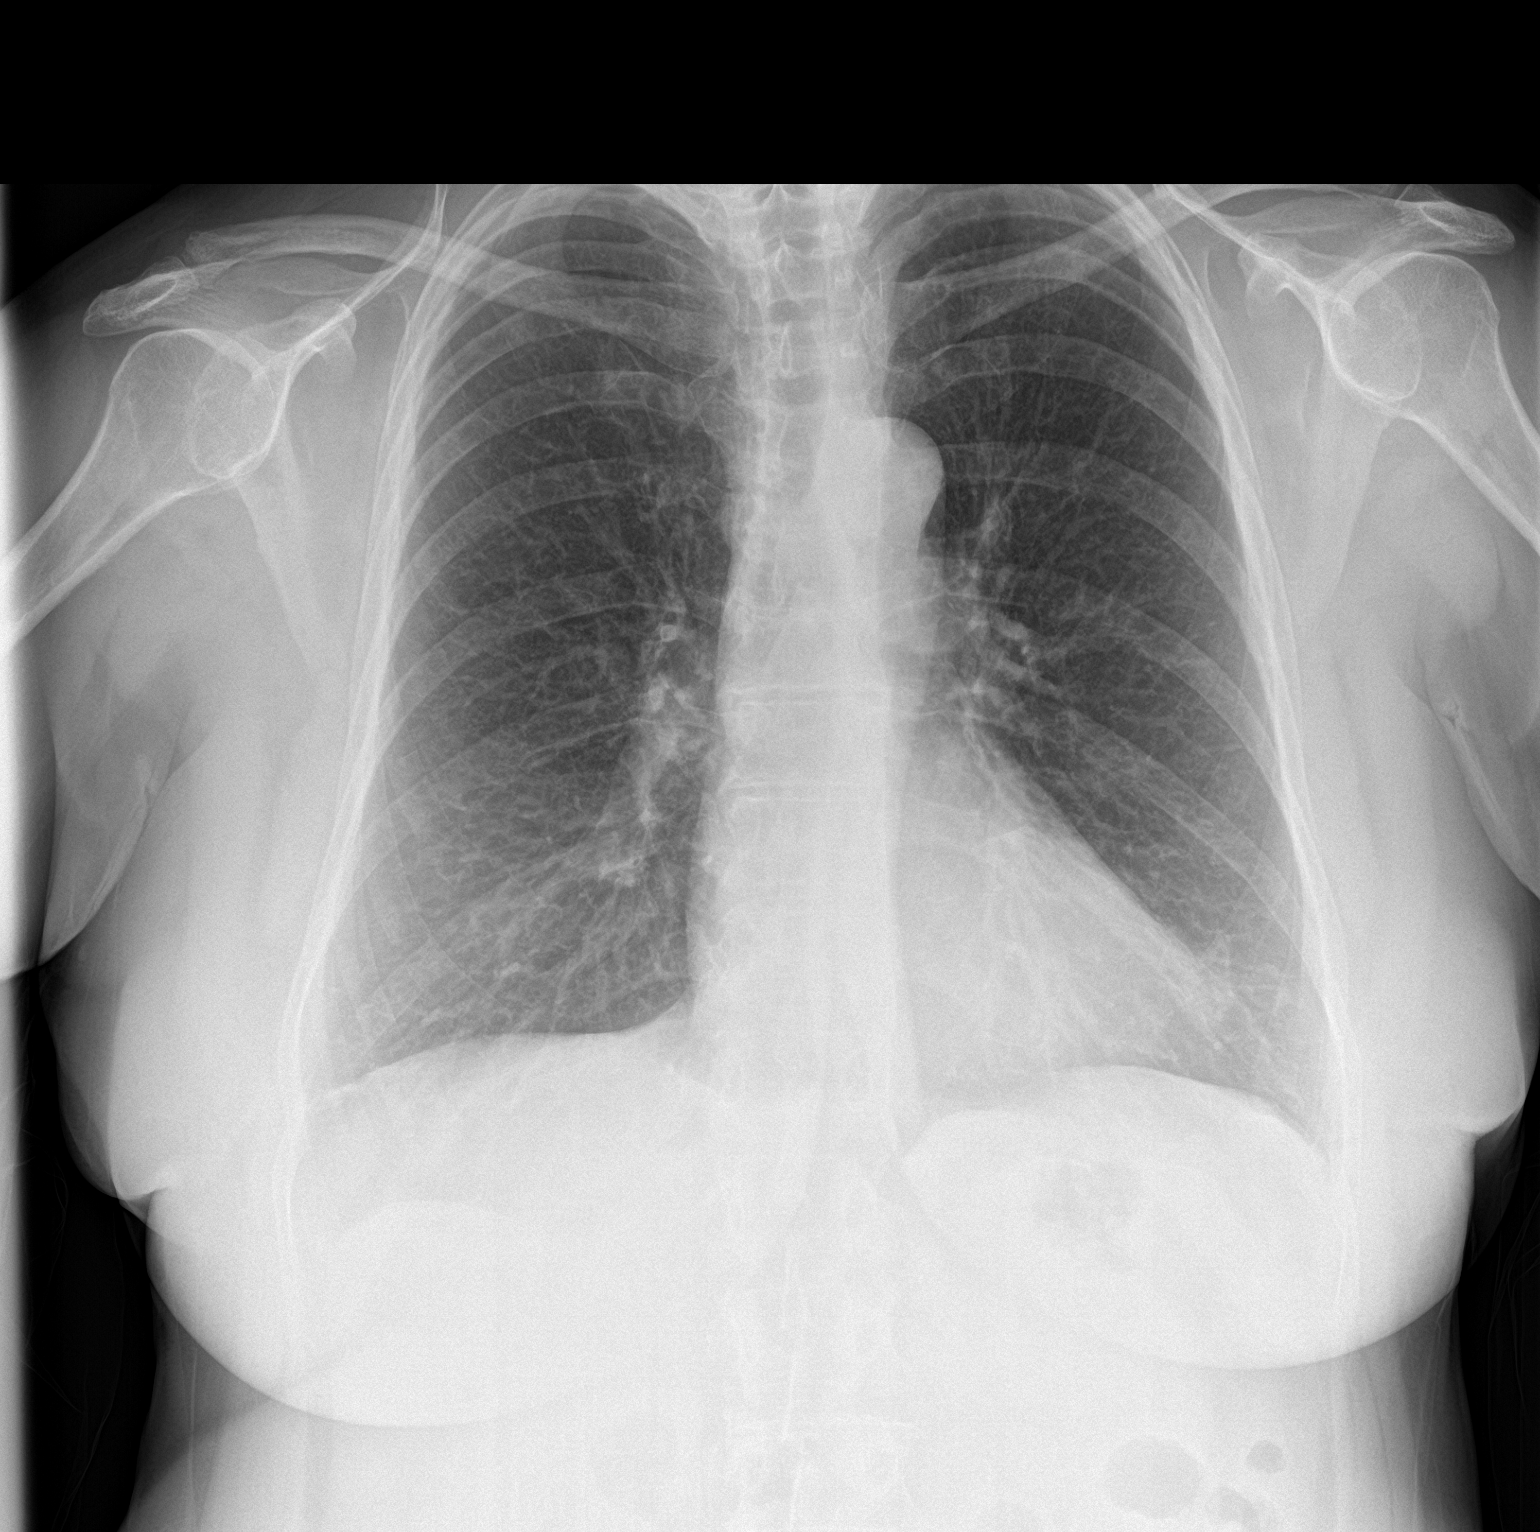
[im 2/2]
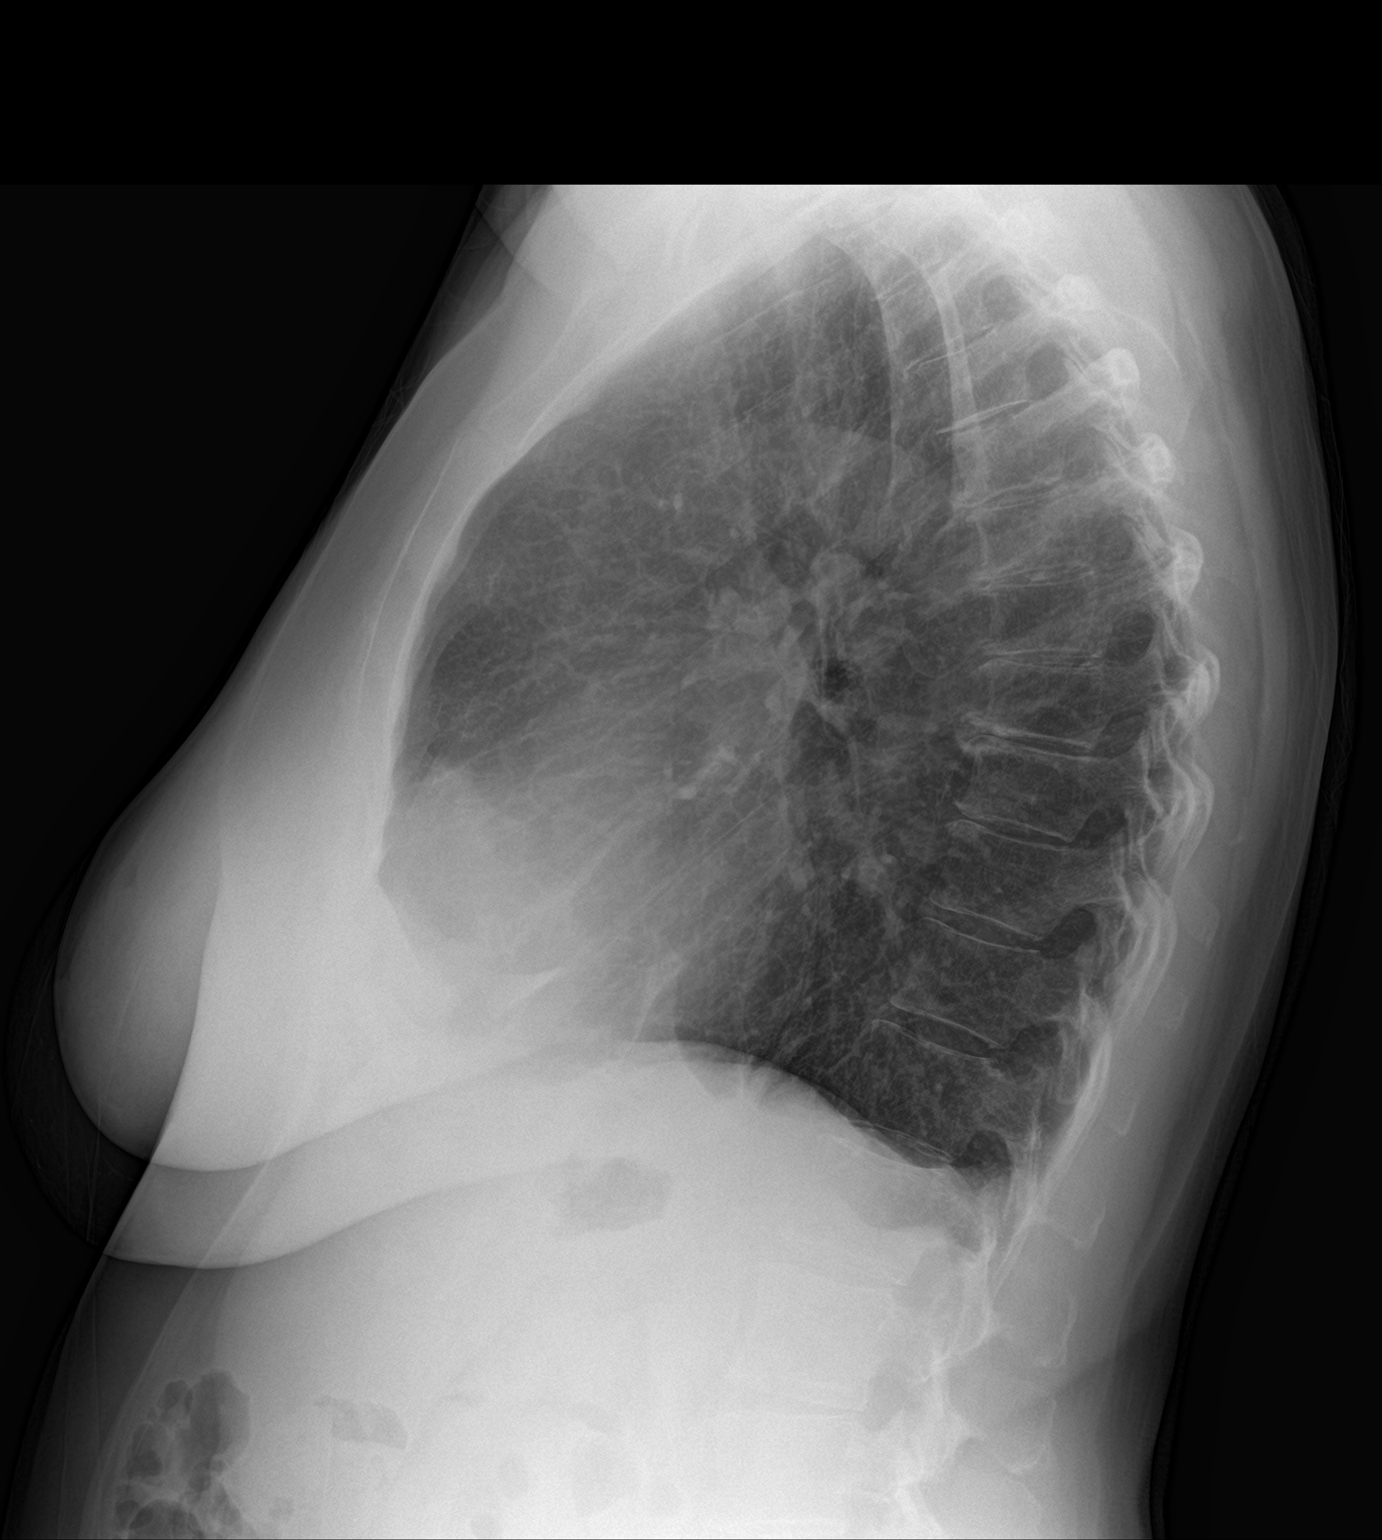

[2 of 2 positions shown; findings below may reference images not displayed]

FINDINGS: Cardiac shadow is stable. The lungs are well aerated without focal
infiltrate. No sizable effusion is seen. No acute bony abnormality
is noted. No pneumothorax is seen.
IMPRESSION: No acute abnormality noted.

## 2020-10-31 ENCOUNTER — Encounter: Payer: Self-pay | Admitting: Family Medicine

## 2020-10-31 ENCOUNTER — Other Ambulatory Visit: Payer: Self-pay | Admitting: Family Medicine

## 2020-11-01 ENCOUNTER — Encounter: Payer: Self-pay | Admitting: Family Medicine

## 2020-11-09 ENCOUNTER — Encounter: Payer: Self-pay | Admitting: Family Medicine

## 2020-11-21 ENCOUNTER — Other Ambulatory Visit: Payer: Self-pay

## 2020-11-21 DIAGNOSIS — I10 Essential (primary) hypertension: Secondary | ICD-10-CM

## 2020-11-21 DIAGNOSIS — J449 Chronic obstructive pulmonary disease, unspecified: Secondary | ICD-10-CM

## 2020-11-21 DIAGNOSIS — E1142 Type 2 diabetes mellitus with diabetic polyneuropathy: Secondary | ICD-10-CM

## 2020-11-21 DIAGNOSIS — Z5181 Encounter for therapeutic drug level monitoring: Secondary | ICD-10-CM

## 2020-11-22 LAB — HEMOGLOBIN A1C
Hgb A1c MFr Bld: 5.7 % of total Hgb — ABNORMAL HIGH (ref <5.7)
Mean Plasma Glucose: 117 mg/dL
eAG (mmol/L): 6.5 mmol/L

## 2020-11-22 LAB — CBC WITH DIFFERENTIAL/PLATELET
Absolute Monocytes: 851 cells/uL (ref 200–950)
Basophils Absolute: 84 cells/uL (ref 0–200)
Basophils Relative: 0.8 %
Eosinophils Absolute: 263 cells/uL (ref 15–500)
Eosinophils Relative: 2.5 %
HCT: 42.7 % (ref 35.0–45.0)
Hemoglobin: 14.2 g/dL (ref 11.7–15.5)
Lymphs Abs: 2856 cells/uL (ref 850–3900)
MCH: 30.3 pg (ref 27.0–33.0)
MCHC: 33.3 g/dL (ref 32.0–36.0)
MCV: 91.2 fL (ref 80.0–100.0)
MPV: 8.5 fL (ref 7.5–12.5)
Monocytes Relative: 8.1 %
Neutro Abs: 6447 cells/uL (ref 1500–7800)
Neutrophils Relative %: 61.4 %
Platelets: 515 10*3/uL — ABNORMAL HIGH (ref 140–400)
RBC: 4.68 10*6/uL (ref 3.80–5.10)
RDW: 13.3 % (ref 11.0–15.0)
Total Lymphocyte: 27.2 %
WBC: 10.5 10*3/uL (ref 3.8–10.8)

## 2020-11-22 LAB — COMPLETE METABOLIC PANEL WITH GFR
AG Ratio: 1.7 (calc) (ref 1.0–2.5)
ALT: 9 U/L (ref 6–29)
AST: 20 U/L (ref 10–35)
Albumin: 4.1 g/dL (ref 3.6–5.1)
Alkaline phosphatase (APISO): 67 U/L (ref 37–153)
BUN/Creatinine Ratio: 7 (calc) (ref 6–22)
BUN: 4 mg/dL — ABNORMAL LOW (ref 7–25)
CO2: 29 mmol/L (ref 20–32)
Calcium: 9.5 mg/dL (ref 8.6–10.4)
Chloride: 96 mmol/L — ABNORMAL LOW (ref 98–110)
Creat: 0.58 mg/dL (ref 0.50–0.99)
GFR, Est African American: 116 mL/min/{1.73_m2} (ref >=60)
GFR, Est Non African American: 100 mL/min/{1.73_m2} (ref >=60)
Globulin: 2.4 g/dL (calc) (ref 1.9–3.7)
Glucose, Bld: 100 mg/dL — ABNORMAL HIGH (ref 65–99)
Potassium: 5.1 mmol/L (ref 3.5–5.3)
Sodium: 133 mmol/L — ABNORMAL LOW (ref 135–146)
Total Bilirubin: 0.5 mg/dL (ref 0.2–1.2)
Total Protein: 6.5 g/dL (ref 6.1–8.1)

## 2020-12-03 ENCOUNTER — Other Ambulatory Visit: Payer: Self-pay | Admitting: Family Medicine

## 2020-12-03 DIAGNOSIS — E871 Hypo-osmolality and hyponatremia: Secondary | ICD-10-CM

## 2020-12-03 DIAGNOSIS — D75839 Thrombocytosis, unspecified: Secondary | ICD-10-CM

## 2020-12-11 ENCOUNTER — Ambulatory Visit: Payer: BLUE CROSS/BLUE SHIELD | Admitting: Family Medicine

## 2020-12-26 ENCOUNTER — Other Ambulatory Visit: Payer: Self-pay | Admitting: Family Medicine

## 2020-12-26 DIAGNOSIS — M5416 Radiculopathy, lumbar region: Secondary | ICD-10-CM

## 2020-12-27 ENCOUNTER — Encounter: Payer: Self-pay | Admitting: Family Medicine

## 2020-12-28 NOTE — Telephone Encounter (Signed)
Look up meds on goodrx and call insurance - pt can notify us what ones need to be sent to different pharmacies and if we need to change meds completely we will need an appt to do those changes

## 2021-01-22 ENCOUNTER — Other Ambulatory Visit: Payer: Self-pay

## 2021-01-22 ENCOUNTER — Encounter: Payer: Self-pay | Admitting: Family Medicine

## 2021-01-22 ENCOUNTER — Ambulatory Visit (INDEPENDENT_AMBULATORY_CARE_PROVIDER_SITE_OTHER): Payer: 59 | Admitting: Family Medicine

## 2021-01-22 VITALS — BP 128/78 | HR 95 | Temp 98.0°F | Resp 18 | Ht 62.0 in | Wt 128.9 lb

## 2021-01-22 DIAGNOSIS — E782 Mixed hyperlipidemia: Secondary | ICD-10-CM

## 2021-01-22 DIAGNOSIS — I10 Essential (primary) hypertension: Secondary | ICD-10-CM

## 2021-01-22 DIAGNOSIS — R634 Abnormal weight loss: Secondary | ICD-10-CM

## 2021-01-22 DIAGNOSIS — Z1231 Encounter for screening mammogram for malignant neoplasm of breast: Secondary | ICD-10-CM

## 2021-01-22 DIAGNOSIS — J449 Chronic obstructive pulmonary disease, unspecified: Secondary | ICD-10-CM | POA: Diagnosis not present

## 2021-01-22 DIAGNOSIS — F320 Major depressive disorder, single episode, mild: Secondary | ICD-10-CM

## 2021-01-22 DIAGNOSIS — Z5181 Encounter for therapeutic drug level monitoring: Secondary | ICD-10-CM

## 2021-01-22 DIAGNOSIS — E1142 Type 2 diabetes mellitus with diabetic polyneuropathy: Secondary | ICD-10-CM | POA: Diagnosis not present

## 2021-01-22 DIAGNOSIS — F439 Reaction to severe stress, unspecified: Secondary | ICD-10-CM

## 2021-01-22 DIAGNOSIS — D75839 Thrombocytosis, unspecified: Secondary | ICD-10-CM

## 2021-01-22 DIAGNOSIS — I700 Atherosclerosis of aorta: Secondary | ICD-10-CM

## 2021-01-22 DIAGNOSIS — E871 Hypo-osmolality and hyponatremia: Secondary | ICD-10-CM

## 2021-01-22 DIAGNOSIS — F4321 Adjustment disorder with depressed mood: Secondary | ICD-10-CM

## 2021-01-22 MED ORDER — LISINOPRIL 5 MG PO TABS: 5.0000 mg | ORAL_TABLET | Freq: Every day | ORAL | 3 refills | Status: DC

## 2021-01-22 MED ORDER — ALBUTEROL SULFATE HFA 108 (90 BASE) MCG/ACT IN AERS: 2.0000 | INHALATION_SPRAY | Freq: Four times a day (QID) | RESPIRATORY_TRACT | 5 refills | Status: DC | PRN

## 2021-01-22 MED ORDER — MONTELUKAST SODIUM 10 MG PO TABS: 10.0000 mg | ORAL_TABLET | Freq: Every day | ORAL | 3 refills | Status: AC

## 2021-01-22 NOTE — Progress Notes (Signed)
Name: Savannah Washington   MRN: [id number]    DOB: 12-17-1960   Date:01/22/2021       Progress Note  Chief Complaint  Patient presents with  . Follow-up  . Hypertension  . Depression    With weight loss     Subjective:   Savannah Washington is a 60 y.o. female, presents to clinic for routine f/up  Hypertension:  Currently managed on  Lisinopril 5 mg daily Pt reports good med compliance and denies any SE.   Blood pressure today is well controlled. BP Readings from Last 3 Encounters:  01/22/21 128/78  06/12/20 118/76  03/06/20 (!) 132/94   Pt denies CP, SOB, exertional sx, LE edema, palpitation, Ha's, visual disturbances, lightheadedness, hypotension, syncope. Dietary efforts for BP?  Trying to be healthy   Hyperlipidemia: Currently treated with lipitor 80 mg and vascepa, pt reports good med compliance Last Lipids: Lab Results  Component Value Date   CHOL 175 03/06/2020   HDL 61 03/06/2020   LDLCALC 86 03/06/2020   TRIG 183 (H) 03/06/2020   CHOLHDL 2.9 03/06/2020   - Denies: Chest pain, shortness of breath, myalgias, claudication  DM:  A1C recently checked and pt was at goal Pt managing DM with metformin Reports good med compliance Pt has no SE from meds. Blood sugars not checking Denies: Polyuria, polydipsia, vision changes, neuropathy, hypoglycemia Recent pertinent labs: Lab Results  Component Value Date   HGBA1C 5.7 (H) 11/21/2020   HGBA1C 5.6 06/12/2020   HGBA1C 5.7 (H) 03/06/2020    COPD - cannot afford albuterol inhaler, breathing at baseline, no current wheeze, change in cough or exertional dyspnea  Weight loss - not trying, sig stress, decreased appetite - down 10 lbs in the past 6-7 months -  No abd pain, change to baseline respiratory sx, denies diaphoresis, CP, hemoptysis, night sweats, change in bowels.  Wt Readings from Last 5 Encounters:  01/22/21 128 lb 14.4 oz (58.5 kg)  06/12/20 138 lb 14.4 oz (63 kg)  03/06/20 144 lb (65.3 kg)  03/01/20 141  lb 11.2 oz (64.3 kg)  02/10/20 145 lb (65.8 kg)   BMI Readings from Last 5 Encounters:  01/22/21 23.58 kg/m  06/12/20 25.41 kg/m  03/06/20 26.34 kg/m  03/01/20 25.92 kg/m  02/10/20 26.52 kg/m   Depression screen [facility] 2/9 01/22/2021 06/12/2020 03/01/2020  Decreased Interest 1 0 0  Down, Depressed, Hopeless 1 0 1  PHQ - 2 Score 2 0 1  Altered sleeping 2 - 0  Tired, decreased energy 1 - 1  Change in appetite 1 - 0  Feeling bad or failure about yourself  0 - 0  Trouble concentrating 0 - 0  Moving slowly or fidgety/restless 1 - 0  Suicidal thoughts 0 - 0  PHQ-9 Score 7 - 2  Difficult doing work/chores Somewhat difficult - Not difficult at all  Some recent data might be hidden   GAD 7 : Generalized Anxiety Score 01/22/2021 03/24/2019 12/24/2018  Nervous, Anxious, on Edge 1 2 1   Control/stop worrying 1 0 1  Worry too much - different things 1 0 0  Trouble relaxing 1 3 1   Restless 1 3 0  Easily annoyed or irritable 1 1 1   Afraid - awful might happen 0 0 0  Total GAD 7 Score 6 9 4   Anxiety Difficulty Somewhat difficult Not difficult at all Not difficult at all   On wellbutrin and buspar  Mood worse, stress worse- Multiple stressors - sister passed away in  Feb, son's best friend died in traumatic CVA Denies SI, HI, AVH  Chronic pain - lyrica, ibuprofen, mobic, muscle relaxers, tumeric - no change to chronic pain, fairly well controlled and she is functional    Current Outpatient Medications:  .  atorvastatin (LIPITOR) 80 MG tablet, Take 1 tablet (80 mg total) by mouth at bedtime., Disp: 90 tablet, Rfl: 3 .  buPROPion (WELLBUTRIN XL) 300 MG 24 hr tablet, Take 1 tablet by mouth once daily, Disp: 90 tablet, Rfl: 3 .  busPIRone (BUSPAR) 15 MG tablet, Take 1 tablet (15 mg total) by mouth 2 (two) times daily., Disp: 180 tablet, Rfl: 3 .  cetirizine (ZYRTEC) 10 MG tablet, Take 10 mg by mouth daily., Disp: , Rfl:  .  Fluticasone-Umeclidin-Vilant (TRELEGY ELLIPTA) 100-62.5-25 MCG/INH AEPB,  Inhale 1 each into the lungs daily., Disp: 1 each, Rfl: 12 .  ibuprofen (ADVIL) 600 MG tablet, TAKE 1 TABLET BY MOUTH THREE TIMES DAILY, Disp: 30 tablet, Rfl: 0 .  icosapent Ethyl (VASCEPA) 1 g capsule, Take 2 capsules (2 g total) by mouth 2 (two) times daily., Disp: 360 capsule, Rfl: 3 .  ipratropium-albuterol (DUONEB) 0.5-2.5 (3) MG/3ML SOLN, Take 3 mLs by nebulization every 8 (eight) hours as needed., Disp: 180 mL, Rfl: 1 .  meloxicam (MOBIC) 15 MG tablet, Take 1 tablet by mouth once daily, Disp: 90 tablet, Rfl: 0 .  metFORMIN (GLUCOPHAGE) 1000 MG tablet, TAKE 1 TABLET BY MOUTH TWICE DAILY WITH A MEAL, Disp: 180 tablet, Rfl: 3 .  methocarbamol (ROBAXIN) 500 MG tablet, Take 1 tablet by mouth three times daily as needed for muscle spasm, Disp: 90 tablet, Rfl: 0 .  Multiple Vitamins-Minerals (MULTI COMPLETE PO), Take 1,000 Units by mouth daily., Disp: , Rfl:  .  nystatin (MYCOSTATIN/NYSTOP) powder, Apply topically 3 (three) times daily as needed., Disp: 60 g, Rfl: 2 .  pregabalin (LYRICA) 25 MG capsule, Take 1 capsule (25 mg total) by mouth 2 (two) times daily., Disp: 180 capsule, Rfl: 2 .  Turmeric 500 MG CAPS, Take 500 mg by mouth daily., Disp: , Rfl:  .  albuterol (PROAIR HFA) 108 (90 Base) MCG/ACT inhaler, Inhale 2 puffs into the lungs every 6 (six) hours as needed for wheezing or shortness of breath., Disp: 1 each, Rfl: 5 .  lisinopril (ZESTRIL) 5 MG tablet, Take 1 tablet (5 mg total) by mouth daily., Disp: 90 tablet, Rfl: 3  Patient Active Problem List   Diagnosis Date Noted  . Type 2 diabetes mellitus with diabetic polyneuropathy, without long-term current use of insulin ([place]) 02/04/2020  . Tobacco abuse 12/01/2018  . Aortic atherosclerosis ([place]) 12/29/2017  . Hepatic steatosis 12/29/2017  . COPD (chronic obstructive pulmonary disease) ([place]) 12/05/2017  . Degenerative disc disease, cervical 09/04/2017  . Cervical arthritis 09/04/2017  . Muscle spasm of left shoulder area 09/03/2017  .  Elevated platelet count 04/11/2017  . Marijuana use 03/17/2017  . Prediabetes 03/14/2017  . Essential hypertension, benign 03/14/2017  . Hyperlipidemia 03/14/2017  . Anxiety disorder 03/14/2017    History reviewed. No pertinent surgical history.  Family History  Problem Relation Age of Onset  . Dementia Mother   . Breast cancer Sister 14  . Aneurysm Father   . Heart attack Brother   . Heart defect Brother   . Brain cancer Daughter   . Heart attack Paternal Grandfather   . Drug abuse Sister     Social History   Tobacco Use  . Smoking status: Current Every Day Smoker  Packs/day: 0.50    Years: 44.00    Pack years: 22.00    Types: Cigarettes    Start date: 08/30/2018  . Smokeless tobacco: Never Used  Vaping Use  . Vaping Use: Former  Substance Use Topics  . Alcohol use: Yes    Alcohol/week: 5.0 standard drinks    Types: 1 Glasses of wine, 4 Cans of beer per week  . Drug use: No     No Known Allergies  Health Maintenance  Topic Date Due  . MAMMOGRAM  05/23/2019  . Hepatitis C Screening  02/03/2021 (Originally 1961-02-19)  . HIV Screening  02/03/2021 (Originally 01/29/1975)  . COVID-19 Vaccine (1) 02/07/2021 (Originally [birth year]28[birth year]1965)  . TETANUS/TDAP  01/22/2022 (Originally 01/29/1979)  . FOOT EXAM  02/03/2021  . OPHTHALMOLOGY EXAM  04/10/2021  . INFLUENZA VACCINE  04/30/2021  . PAP SMEAR-Modifier  05/08/2021  . HEMOGLOBIN A1C  05/21/2021  . COLONOSCOPY (Pts 45-69yrs Insurance coverage will need to be confirmed)  09/30/2021  . PNEUMOCOCCAL POLYSACCHARIDE VACCINE AGE 72-64 HIGH RISK  Completed  . HPV VACCINES  Aged Out    Chart Review Today: I personally reviewed active problem list, medication list, allergies, family history, social history, health maintenance, notes from last encounter, lab results, imaging with the patient/caregiver today.   Review of Systems  All other Systems reviewed and are negative for acute change except as noted in the HPI.  Objective:    Vitals:   01/22/21 1442  BP: 128/78  Pulse: 95  Resp: 18  Temp: 98 F (36.7 C)  SpO2: 97%  Weight: 128 lb 14.4 oz (58.5 kg)  Height: 5\' 2"  (1.575 m)    Body mass index is 23.58 kg/m.  Physical Exam Vitals and nursing note reviewed.  Constitutional:      General: She is not in acute distress.    Appearance: Normal appearance. She is well-developed. She is not ill-appearing, toxic-appearing or diaphoretic.     Interventions: Face mask in place.  HENT:     Head: Normocephalic and atraumatic.     Right Ear: External ear normal.     Left Ear: External ear normal.  Eyes:     General: Lids are normal. No scleral icterus.       Right eye: No discharge.        Left eye: No discharge.     Conjunctiva/sclera: Conjunctivae normal.  Neck:     Trachea: Phonation normal. No tracheal deviation.  Cardiovascular:     Rate and Rhythm: Normal rate and regular rhythm.     Pulses: Normal pulses.          Radial pulses are 2+ on the right side and 2+ on the left side.       Posterior tibial pulses are 2+ on the right side and 2+ on the left side.     Heart sounds: Normal heart sounds. No murmur heard. No friction rub. No gallop.   Pulmonary:     Effort: Pulmonary effort is normal. No respiratory distress.     Breath sounds: Normal breath sounds. No stridor. No wheezing, rhonchi or rales.  Chest:     Chest wall: No tenderness.  Abdominal:     General: Bowel sounds are normal. There is no distension.     Palpations: Abdomen is soft.  Musculoskeletal:     Right lower leg: No edema.     Left lower leg: No edema.  Skin:    General: Skin is warm and dry.  Coloration: Skin is not jaundiced or pale.     Findings: No rash.  Neurological:     Mental Status: She is alert.     Motor: No abnormal muscle tone.     Gait: Gait normal.  Psychiatric:        Attention and Perception: Attention normal.        Mood and Affect: Mood is not anxious, depressed or elated. Affect is tearful. Affect  is not labile, blunt, flat or inappropriate.        Speech: Speech normal.        Behavior: Behavior normal. Behavior is cooperative.        Thought Content: Thought content normal.        Cognition and Memory: Cognition and memory normal.         Assessment & Plan:     ICD-10-CM   1. Type 2 diabetes mellitus with diabetic polyneuropathy, without long-term current use of insulin (HCC)  X90.24 COMPLETE METABOLIC PANEL WITH GFR   stable, well controlled, last A1C at goal, continue metformin, statin, acei  2. Essential hypertension, benign  I10 lisinopril (ZESTRIL) 5 MG tablet    COMPLETE METABOLIC PANEL WITH GFR   stable, well controlled, BP at goal today, continue lisinopril 5 mg daily and DASH  3. Chronic obstructive pulmonary disease, unspecified COPD type ([place])  J44.9 albuterol (PROAIR HFA) 108 (90 Base) MCG/ACT inhaler    CBC with Differential/Platelet    COMPLETE METABOLIC PANEL WITH GFR   stable sx, need to help get rescue inhaler for her  4. Mixed hyperlipidemia  O97.3 COMPLETE METABOLIC PANEL WITH GFR    Lipid panel   managed with high dose lipitor and vascepa + diet/lifestyle efforts  5. Aortic atherosclerosis (HCC)  I70.0    on statin, monitoring  6. Medication monitoring encounter  Z51.81 CBC with Differential/Platelet    COMPLETE METABOLIC PANEL WITH GFR    Lipid panel    TSH  7. Encounter for screening mammogram for malignant neoplasm of breast  Z12.31 MM 3D SCREEN BREAST BILATERAL  8. Weight loss, unintentional  Z32.9 COMPLETE METABOLIC PANEL WITH GFR    TSH   r/o hyperthyroid, check protein, white count, no overtsx of infection or malignancy, likely related to stress/anxiety/grief and loss  9. Situational stress  F43.9    recommend counseling/psych and possibly adjusting meds to at SSRI or SNRI - only on wellbutrin   10. Grief reaction  F43.21    counseling and referral offered, supportive listening today  11. Encounter for medication monitoring  Z51.81 CBC with  Differential/Platelet    COMPLETE METABOLIC PANEL WITH GFR    Lipid panel    TSH  12. Hyponatremia  J24.2 COMPLETE METABOLIC PANEL WITH GFR  13. Thrombocytosis  D75.839 CBC with Differential/Platelet  14. Current mild episode of major depressive disorder, unspecified whether recurrent ([place])  F32.0    see above     Return for 2 month nurse f/up with full VS and weight - labs ordered today due for 6/22.   [name], PA-C 01/22/21 2:59 PM

## 2021-01-22 NOTE — Patient Instructions (Signed)
[email or web address]  Look up meds - put in doses and amount of pills and look at surrounding pharmacies and see what meds would be worth getting moved to a different pharmacy.

## 2021-01-29 ENCOUNTER — Ambulatory Visit
Admission: RE | Admit: 2021-01-29 | Discharge: 2021-01-29 | Disposition: A | Discharge status: Home / Self Care | Payer: 59 | Source: Ambulatory Visit | Attending: Family Medicine | Admitting: Family Medicine

## 2021-01-29 ENCOUNTER — Other Ambulatory Visit: Payer: Self-pay

## 2021-01-29 DIAGNOSIS — Z1231 Encounter for screening mammogram for malignant neoplasm of breast: Secondary | ICD-10-CM | POA: Diagnosis present

## 2021-02-12 ENCOUNTER — Encounter: Payer: Self-pay | Admitting: Family Medicine

## 2021-02-12 DIAGNOSIS — F320 Major depressive disorder, single episode, mild: Secondary | ICD-10-CM | POA: Insufficient documentation

## 2021-02-16 ENCOUNTER — Other Ambulatory Visit: Payer: Self-pay | Admitting: Family Medicine

## 2021-03-02 ENCOUNTER — Other Ambulatory Visit: Payer: Self-pay | Admitting: *Deleted

## 2021-03-02 DIAGNOSIS — F17200 Nicotine dependence, unspecified, uncomplicated: Secondary | ICD-10-CM

## 2021-03-02 DIAGNOSIS — Z122 Encounter for screening for malignant neoplasm of respiratory organs: Secondary | ICD-10-CM

## 2021-03-02 DIAGNOSIS — Z87891 Personal history of nicotine dependence: Secondary | ICD-10-CM

## 2021-03-02 NOTE — Progress Notes (Signed)
Contacted and scheduled for annual lung screening scan. Patient is a current smoker with a 45 pack year history.

## 2021-03-12 ENCOUNTER — Ambulatory Visit: Admission: RE | Admit: 2021-03-12 | Payer: 59 | Source: Ambulatory Visit

## 2021-03-13 ENCOUNTER — Other Ambulatory Visit: Payer: Self-pay | Admitting: Family Medicine

## 2021-03-26 ENCOUNTER — Other Ambulatory Visit: Payer: Self-pay

## 2021-03-26 ENCOUNTER — Ambulatory Visit: Payer: 59

## 2021-03-26 VITALS — BP 122/74 | HR 83 | Temp 97.9°F | Resp 16 | Ht 62.0 in | Wt 129.0 lb

## 2021-03-26 DIAGNOSIS — Z0130 Encounter for examination of blood pressure without abnormal findings: Secondary | ICD-10-CM

## 2021-03-27 LAB — COMPLETE METABOLIC PANEL WITH GFR
AG Ratio: 2.1 (calc) (ref 1.0–2.5)
ALT: 16 U/L (ref 6–29)
AST: 31 U/L (ref 10–35)
Albumin: 4.4 g/dL (ref 3.6–5.1)
Alkaline phosphatase (APISO): 49 U/L (ref 37–153)
BUN: 8 mg/dL (ref 7–25)
CO2: 31 mmol/L (ref 20–32)
Calcium: 9 mg/dL (ref 8.6–10.4)
Chloride: 98 mmol/L (ref 98–110)
Creat: 0.63 mg/dL (ref 0.50–0.99)
GFR, Est African American: 112 mL/min/{1.73_m2} (ref >=60)
GFR, Est Non African American: 97 mL/min/{1.73_m2} (ref >=60)
Globulin: 2.1 g/dL (calc) (ref 1.9–3.7)
Glucose, Bld: 88 mg/dL (ref 65–99)
Potassium: 5.5 mmol/L — ABNORMAL HIGH (ref 3.5–5.3)
Sodium: 136 mmol/L (ref 135–146)
Total Bilirubin: 0.4 mg/dL (ref 0.2–1.2)
Total Protein: 6.5 g/dL (ref 6.1–8.1)

## 2021-03-27 LAB — CBC WITH DIFFERENTIAL/PLATELET
Absolute Monocytes: 791 cells/uL (ref 200–950)
Basophils Absolute: 77 cells/uL (ref 0–200)
Basophils Relative: 0.9 %
Eosinophils Absolute: 170 cells/uL (ref 15–500)
Eosinophils Relative: 2 %
HCT: 44.6 % (ref 35.0–45.0)
Hemoglobin: 14.9 g/dL (ref 11.7–15.5)
Lymphs Abs: 2924 cells/uL (ref 850–3900)
MCH: 31 pg (ref 27.0–33.0)
MCHC: 33.4 g/dL (ref 32.0–36.0)
MCV: 92.7 fL (ref 80.0–100.0)
MPV: 8.3 fL (ref 7.5–12.5)
Monocytes Relative: 9.3 %
Neutro Abs: 4539 cells/uL (ref 1500–7800)
Neutrophils Relative %: 53.4 %
Platelets: 446 10*3/uL — ABNORMAL HIGH (ref 140–400)
RBC: 4.81 10*6/uL (ref 3.80–5.10)
RDW: 13.7 % (ref 11.0–15.0)
Total Lymphocyte: 34.4 %
WBC: 8.5 10*3/uL (ref 3.8–10.8)

## 2021-03-27 LAB — LIPID PANEL
Cholesterol: 134 mg/dL (ref <200)
HDL: 81 mg/dL (ref >=50)
LDL Cholesterol (Calc): 38 mg/dL (calc)
Non-HDL Cholesterol (Calc): 53 mg/dL (calc) (ref <130)
Total CHOL/HDL Ratio: 1.7 (calc) (ref <5.0)
Triglycerides: 71 mg/dL (ref <150)

## 2021-03-27 LAB — TSH: TSH: 2.85 mIU/L (ref 0.40–4.50)

## 2021-04-04 ENCOUNTER — Other Ambulatory Visit: Payer: Self-pay | Admitting: Family Medicine

## 2021-04-04 DIAGNOSIS — E875 Hyperkalemia: Secondary | ICD-10-CM

## 2021-04-05 ENCOUNTER — Telehealth: Payer: Self-pay

## 2021-04-05 NOTE — Telephone Encounter (Signed)
Copied from [place] [id number]. Topic: General - Other >> Apr 04, 2021  5:11 PM Bayard Beaver wrote: Reason for CRM: patient requesting call back about lab results. She says she was called earlier and the person she spoke with spoke to fast for her, so she didn't get all the info. Please call back

## 2021-04-05 NOTE — Telephone Encounter (Signed)
Pt.notified

## 2021-04-09 ENCOUNTER — Telehealth: Payer: Self-pay | Admitting: *Deleted

## 2021-04-09 DIAGNOSIS — F17200 Nicotine dependence, unspecified, uncomplicated: Secondary | ICD-10-CM

## 2021-04-09 DIAGNOSIS — Z87891 Personal history of nicotine dependence: Secondary | ICD-10-CM

## 2021-04-09 DIAGNOSIS — Z122 Encounter for screening for malignant neoplasm of respiratory organs: Secondary | ICD-10-CM

## 2021-04-09 NOTE — Telephone Encounter (Signed)
Spoke to patient via telephone re: scheduling the annual low dose lung screening scan. Current smoker, 22 pack yrs. Scan scheduled for 04/16/21 @ 10:30 am.

## 2021-04-16 ENCOUNTER — Ambulatory Visit
Admission: RE | Admit: 2021-04-16 | Discharge: 2021-04-16 | Disposition: A | Discharge status: Home / Self Care | Payer: 59 | Source: Ambulatory Visit | Attending: Acute Care | Admitting: Acute Care

## 2021-04-16 ENCOUNTER — Other Ambulatory Visit: Payer: Self-pay

## 2021-04-16 DIAGNOSIS — Z87891 Personal history of nicotine dependence: Secondary | ICD-10-CM | POA: Insufficient documentation

## 2021-04-16 DIAGNOSIS — F17200 Nicotine dependence, unspecified, uncomplicated: Secondary | ICD-10-CM | POA: Diagnosis present

## 2021-04-16 DIAGNOSIS — Z122 Encounter for screening for malignant neoplasm of respiratory organs: Secondary | ICD-10-CM | POA: Diagnosis present

## 2021-04-16 LAB — POTASSIUM: Potassium: 4.9 mmol/L (ref 3.5–5.3)

## 2021-04-20 ENCOUNTER — Encounter: Payer: Self-pay | Admitting: *Deleted

## 2021-04-23 ENCOUNTER — Telehealth: Payer: Self-pay | Admitting: Acute Care

## 2021-04-23 DIAGNOSIS — F17210 Nicotine dependence, cigarettes, uncomplicated: Secondary | ICD-10-CM

## 2021-04-23 NOTE — Telephone Encounter (Signed)
Burgess Estelle, RN sent pt a letter on 04/20/21 with CT results.  I have faxed results to PCP and placed order for 1 yr f/u low dose ct.

## 2021-05-01 NOTE — Progress Notes (Signed)

## 2021-05-04 ENCOUNTER — Other Ambulatory Visit: Payer: Self-pay | Admitting: Family Medicine

## 2021-05-04 DIAGNOSIS — Z87891 Personal history of nicotine dependence: Secondary | ICD-10-CM

## 2021-05-04 DIAGNOSIS — F418 Other specified anxiety disorders: Secondary | ICD-10-CM

## 2021-05-15 ENCOUNTER — Telehealth: Payer: Self-pay

## 2021-05-15 NOTE — Telephone Encounter (Signed)
Will you be able to order the TB test for her today?  Copied from [place] [phone]. Topic: General - Other >> May 15, 2021 12:39 PM Savannah Washington, [place] C wrote: Reason for CRM: pt called in to schedule a TB test. Pt says that she would like to have this today if possible.    Please assist pt further.   CB: [phone]

## 2021-05-16 NOTE — Telephone Encounter (Signed)
Pt called notified we only do blood draw

## 2021-06-05 ENCOUNTER — Other Ambulatory Visit: Payer: Self-pay

## 2021-06-05 ENCOUNTER — Ambulatory Visit (LOCAL_COMMUNITY_HEALTH_CENTER): Payer: Self-pay

## 2021-06-05 DIAGNOSIS — Z111 Encounter for screening for respiratory tuberculosis: Secondary | ICD-10-CM

## 2021-06-08 ENCOUNTER — Other Ambulatory Visit: Payer: Self-pay

## 2021-06-08 ENCOUNTER — Ambulatory Visit (LOCAL_COMMUNITY_HEALTH_CENTER): Payer: Self-pay

## 2021-06-08 DIAGNOSIS — Z111 Encounter for screening for respiratory tuberculosis: Secondary | ICD-10-CM

## 2021-06-08 LAB — TB SKIN TEST
Induration: 0 mm
TB Skin Test: NEGATIVE

## 2021-07-16 ENCOUNTER — Other Ambulatory Visit: Payer: Self-pay | Admitting: Family Medicine

## 2021-07-16 DIAGNOSIS — E782 Mixed hyperlipidemia: Secondary | ICD-10-CM

## 2021-07-16 DIAGNOSIS — Z5181 Encounter for therapeutic drug level monitoring: Secondary | ICD-10-CM

## 2021-07-16 DIAGNOSIS — I700 Atherosclerosis of aorta: Secondary | ICD-10-CM

## 2021-07-25 MED ORDER — ATORVASTATIN CALCIUM 80 MG PO TABS: 80.0000 mg | ORAL_TABLET | Freq: Every day | ORAL | 3 refills | Status: DC

## 2021-10-02 ENCOUNTER — Encounter: Payer: Self-pay | Admitting: Family Medicine

## 2021-10-02 NOTE — Telephone Encounter (Signed)
Lvm to schedule an appt for the patient per doctor

## 2021-10-26 ENCOUNTER — Other Ambulatory Visit: Payer: Self-pay | Admitting: Family Medicine

## 2021-10-26 DIAGNOSIS — B372 Candidiasis of skin and nail: Secondary | ICD-10-CM

## 2021-10-26 DIAGNOSIS — Z5181 Encounter for therapeutic drug level monitoring: Secondary | ICD-10-CM

## 2021-10-26 DIAGNOSIS — I700 Atherosclerosis of aorta: Secondary | ICD-10-CM

## 2021-10-26 DIAGNOSIS — E782 Mixed hyperlipidemia: Secondary | ICD-10-CM

## 2021-10-26 NOTE — Telephone Encounter (Signed)
Requested medications are due for refill today.  yes  Requested medications are on the active medications list.  yes  Last refill. Vascepa 06/12/2020, Nystatin 06/12/2020  Future visit scheduled.   no  Notes to clinic.  Medications not assigned to a protocol. Please advise.    Requested Prescriptions  Pending Prescriptions Disp Refills   icosapent Ethyl (VASCEPA) 1 g capsule [Pharmacy Med Name: Icosapent Ethyl 1 GM Oral Capsule] 360 capsule 0    Sig: Take 2 capsules by mouth twice daily     Off-Protocol Failed - 10/26/2021  6:37 PM      Failed - Medication not assigned to a protocol, review manually.      Passed - Valid encounter within last 12 months    Recent Outpatient Visits           9 months ago Type 2 diabetes mellitus with diabetic polyneuropathy, without long-term current use of insulin [facility])   [facility] [place], Kristeen Miss, PA-C   1 year ago Type 2 diabetes mellitus with diabetic polyneuropathy, without long-term current use of insulin [facility])   [facility] [name], PA-C   1 year ago Degenerative disc disease, cervical   [facility] [name], PA-C   1 year ago Viral upper respiratory tract infection   [facility] Towanda Malkin, MD   1 year ago Mixed hyperlipidemia   [facility] [place], Kristeen Miss, PA-C               nystatin (MYCOSTATIN/NYSTOP) powder [Pharmacy Med Name: Nystatin 100000 UNIT/GM External Powder] 60 g 0    Sig: APPLY  POWDER TOPICALLY TO AFFECTED AREA THREE TIMES DAILY AS NEEDED     Off-Protocol Failed - 10/26/2021  6:37 PM      Failed - Medication not assigned to a protocol, review manually.      Passed - Valid encounter within last 12 months    Recent Outpatient Visits           9 months ago Type 2 diabetes mellitus with diabetic polyneuropathy, without long-term current use of insulin [facility])   [facility] [place],  Kristeen Miss, PA-C   1 year ago Type 2 diabetes mellitus with diabetic polyneuropathy, without long-term current use of insulin [facility])   [facility] [name], PA-C   1 year ago Degenerative disc disease, cervical   [facility] [name], PA-C   1 year ago Viral upper respiratory tract infection   [facility] Towanda Malkin, MD   1 year ago Mixed hyperlipidemia   [facility] [name], [place]

## 2021-10-29 NOTE — Telephone Encounter (Signed)
Lvm informing that prescriptions have been sent to pharmacy and for pt to return call to schedule appt for any additional refills

## 2021-11-28 ENCOUNTER — Other Ambulatory Visit: Payer: Self-pay | Admitting: Family Medicine

## 2021-11-28 DIAGNOSIS — R7303 Prediabetes: Secondary | ICD-10-CM

## 2021-11-28 DIAGNOSIS — E782 Mixed hyperlipidemia: Secondary | ICD-10-CM

## 2021-11-28 DIAGNOSIS — Z5181 Encounter for therapeutic drug level monitoring: Secondary | ICD-10-CM

## 2021-11-28 DIAGNOSIS — I700 Atherosclerosis of aorta: Secondary | ICD-10-CM

## 2021-11-28 MED ORDER — METFORMIN HCL 1000 MG PO TABS: 1000.0000 mg | ORAL_TABLET | Freq: Two times a day (BID) | ORAL | 0 refills | Status: DC

## 2021-11-28 NOTE — Telephone Encounter (Signed)
Requested medication (s) are due for refill today:   No for atorvastatin,  Yes for metformin ? ?Requested medication (s) are on the active medication list:   Yes for both ? ?Future visit scheduled:   Yes 01/01/2022 with [name] ? ? ?Last ordered: Atorvastatin 07/25/2021 #90, 3 refills;   Metformin 05/19/2020 #180, 3 refills ? ?Returned because protocol criteria not met.    Labs A1C and B12 needed.  Provider to review for refills prior to upcoming appt. Due to not having a valid encounter within 6 months per protocol.    ? ?Requested Prescriptions  ?Pending Prescriptions Disp Refills  ? atorvastatin (LIPITOR) 80 MG tablet 90 tablet 3  ?  Sig: Take 1 tablet (80 mg total) by mouth at bedtime.  ?  ? Cardiovascular:  Antilipid - Statins Failed - 11/28/2021 12:45 PM  ?  ?  Failed - Lipid Panel in normal range within the last 12 months  ?  Cholesterol  ?Date Value Ref Range Status  ?03/26/2021 134 <200 mg/dL Final  ? ?LDL Cholesterol (Calc)  ?Date Value Ref Range Status  ?03/26/2021 38 mg/dL (calc) Final  ?  Comment:  ?  Reference range: <100 ?Marland Kitchen ?Desirable range <100 mg/dL for primary prevention;   ?<70 mg/dL for patients with CHD or diabetic patients  ?with > or = 2 CHD risk factors. ?. ?LDL-C is now calculated using the Martin-Hopkins  ?calculation, which is a validated novel method providing  ?better accuracy than the Friedewald equation in the  ?estimation of LDL-C.  ?Cresenciano Genre et al. Annamaria Helling. 7681;157(26): 2061-2068  ?(http://education.[employer].com/faq/FAQ164) ?  ? ?HDL  ?Date Value Ref Range Status  ?03/26/2021 81 > OR = 50 mg/dL Final  ? ?Triglycerides  ?Date Value Ref Range Status  ?03/26/2021 71 <150 mg/dL Final  ? ?  ?  ?  Passed - Patient is not pregnant  ?  ?  Passed - Valid encounter within last 12 months  ?  Recent Outpatient Visits   ? ?      ? 10 months ago Type 2 diabetes mellitus with diabetic polyneuropathy, without long-term current use of insulin ([place])  ? [facility] [name], PA-C  ? 1 year ago Type 2 diabetes mellitus with diabetic polyneuropathy, without long-term current use of insulin ([place])  ? [facility] [name], PA-C  ? 1 year ago Degenerative disc disease, cervical  ? [facility] [name], PA-C  ? 1 year ago Viral upper respiratory tract infection  ? [facility] Lebron Conners D, MD  ? 1 year ago Mixed hyperlipidemia  ? [facility] [name], [place]  ? ?  ?  ?Future Appointments   ? ?        ? In 1 month [name], PA-C [facility]  ? ?  ? ?  ?  ?  ? metFORMIN (GLUCOPHAGE) 1000 MG tablet 180 tablet 3  ?  Sig: Take 1 tablet (1,000 mg total) by mouth 2 (two) times daily with a meal.  ?  ? Endocrinology:  Diabetes - Biguanides Failed - 11/28/2021 12:45 PM  ?  ?  Failed - HBA1C is between 0 and 7.9 and within 180 days  ?  Hgb A1c MFr Bld  ?Date Value Ref Range Status  ?11/21/2020 5.7 (H) <5.7 % of total Hgb Final  ?  Comment:  ?  For someone without known diabetes, a hemoglobin  ?A1c value  between 5.7% and 6.4% is consistent with ?prediabetes and should be confirmed with a  ?follow-up test. ?. ?For someone with known diabetes, a value <7% ?indicates that their diabetes is well controlled. A1c ?targets should be individualized based on duration of ?diabetes, age, comorbid conditions, and other ?considerations. ?. ?This assay result is consistent with an increased risk ?of diabetes. ?. ?Currently, no consensus exists regarding use of ?hemoglobin A1c for diagnosis of diabetes for children. ?. ?  ?  ?  ?  ?  Failed - B12 Level in normal range and within 720 days  ?  No results found for: VITAMINB12  ?  ?  ?  Failed - Valid encounter within last 6 months  ?  Recent Outpatient Visits   ? ?      ? 10 months ago Type 2 diabetes mellitus with diabetic polyneuropathy, without long-term current use of insulin ([place])  ? [facility] [name], PA-C   ? 1 year ago Type 2 diabetes mellitus with diabetic polyneuropathy, without long-term current use of insulin ([place])  ? [facility] [name], PA-C  ? 1 year ago Degenerative disc disease, cervical  ? [facility] [name], PA-C  ? 1 year ago Viral upper respiratory tract infection  ? [facility] Lebron Conners D, MD  ? 1 year ago Mixed hyperlipidemia  ? [facility] [name], [place]  ? ?  ?  ?Future Appointments   ? ?        ? In 1 month [name], PA-C [place]  ? ?  ? ?  ?  ?  Passed - Cr in normal range and within 360 days  ?  Creat  ?Date Value Ref Range Status  ?03/26/2021 0.63 0.50 - 0.99 mg/dL Final  ?  Comment:  ?  For patients >24 years of age, the reference limit ?for Creatinine is approximately 13% higher for people ?identified as African-American. ?. ?  ?  ?  ?  ?  Passed - eGFR in normal range and within 360 days  ?  GFR, Est African American  ?Date Value Ref Range Status  ?03/26/2021 112 > OR = 60 mL/min/1.71m Final  ? ?GFR, Est Non African American  ?Date Value Ref Range Status  ?03/26/2021 97 > OR = 60 mL/min/1.712mFinal  ?  ?  ?  ?  Passed - CBC within normal limits and completed in the last 12 months  ?  WBC  ?Date Value Ref Range Status  ?03/26/2021 8.5 3.8 - 10.8 Thousand/uL Final  ? ?RBC  ?Date Value Ref Range Status  ?03/26/2021 4.81 3.80 - 5.10 Million/uL Final  ? ?Hemoglobin  ?Date Value Ref Range Status  ?03/26/2021 14.9 11.7 - 15.5 g/dL Final  ? ?HGB  ?Date Value Ref Range Status  ?11/25/2011 14.1 12.0 - 16.0 g/dL Final  ? ?HCT  ?Date Value Ref Range Status  ?03/26/2021 44.6 35.0 - 45.0 % Final  ?11/25/2011 41.5 35.0 - 47.0 % Final  ? ?MCHC  ?Date Value Ref Range Status  ?03/26/2021 33.4 32.0 - 36.0 g/dL Final  ? ?MCH  ?Date Value Ref Range Status  ?03/26/2021 31.0 27.0 - 33.0 pg Final  ? ?MCV  ?Date Value Ref Range Status  ?03/26/2021 92.7 80.0 - 100.0 fL Final   ?11/25/2011 90 80 - 100 fL Final  ? ?No results found for: PLTCOUNTKUC, LABPLAT, POWallaceRDW  ?Date Value Ref Range  Status  ?03/26/2021 13.7 11.0 - 15.0 % Final  ?11/25/2011 14.0 11.5 - 14.5 % Final  ? ?  ?  ?  ? ?

## 2021-11-28 NOTE — Telephone Encounter (Signed)
Medication Refill - Medication:  ?atorvastatin (LIPITOR) 80 MG tablet  ?metFORMIN (GLUCOPHAGE) 1000 MG tablet  ? ?Has the patient contacted their pharmacy? Yes.   ?Contact PCP ? ?Preferred Pharmacy (with phone number or street name):  ?[facility] [place] [facility] [address]  ?[place], [place] ([place]) [place] [address]  ?Phone:  [phone]  Fax:  [phone]  ? ?Has the patient been seen for an appointment in the last year OR does the patient have an upcoming appointment? Yes.   ? ?Agent: Please be advised that RX refills may take up to 3 business days. We ask that you follow-up with your pharmacy. ?

## 2022-01-01 ENCOUNTER — Ambulatory Visit: Payer: Self-pay | Admitting: Family Medicine

## 2022-01-01 DIAGNOSIS — Z5181 Encounter for therapeutic drug level monitoring: Secondary | ICD-10-CM

## 2022-01-01 DIAGNOSIS — Z1211 Encounter for screening for malignant neoplasm of colon: Secondary | ICD-10-CM

## 2022-01-01 DIAGNOSIS — Z23 Encounter for immunization: Secondary | ICD-10-CM

## 2022-01-01 DIAGNOSIS — E782 Mixed hyperlipidemia: Secondary | ICD-10-CM

## 2022-01-01 DIAGNOSIS — I10 Essential (primary) hypertension: Secondary | ICD-10-CM

## 2022-01-01 DIAGNOSIS — Z1159 Encounter for screening for other viral diseases: Secondary | ICD-10-CM

## 2022-01-01 DIAGNOSIS — I700 Atherosclerosis of aorta: Secondary | ICD-10-CM

## 2022-01-01 DIAGNOSIS — Z114 Encounter for screening for human immunodeficiency virus [HIV]: Secondary | ICD-10-CM

## 2022-01-01 DIAGNOSIS — J449 Chronic obstructive pulmonary disease, unspecified: Secondary | ICD-10-CM

## 2022-01-01 DIAGNOSIS — E1142 Type 2 diabetes mellitus with diabetic polyneuropathy: Secondary | ICD-10-CM

## 2022-01-03 ENCOUNTER — Other Ambulatory Visit: Payer: Self-pay | Admitting: Family Medicine

## 2022-01-13 ENCOUNTER — Other Ambulatory Visit: Payer: Self-pay | Admitting: Physician Assistant

## 2022-01-13 DIAGNOSIS — E782 Mixed hyperlipidemia: Secondary | ICD-10-CM

## 2022-01-13 DIAGNOSIS — I700 Atherosclerosis of aorta: Secondary | ICD-10-CM

## 2022-01-13 DIAGNOSIS — Z5181 Encounter for therapeutic drug level monitoring: Secondary | ICD-10-CM

## 2022-01-14 NOTE — Telephone Encounter (Signed)
Requested Prescriptions  ?Pending Prescriptions Disp Refills  ?? VASCEPA 1 g capsule [Pharmacy Med Name: Vascepa 1 GM Oral Capsule] 90 capsule 0  ?  Sig: Take 2 capsules by mouth twice daily  ?  ? Off-Protocol Failed - 01/13/2022  9:52 AM  ?  ?  Failed - Medication not assigned to a protocol, review manually.  ?  ?  Passed - Valid encounter within last 12 months  ?  Recent Outpatient Visits   ?      ? 11 months ago Type 2 diabetes mellitus with diabetic polyneuropathy, without long-term current use of insulin ([place])  ? [facility] [name], PA-C  ? 1 year ago Type 2 diabetes mellitus with diabetic polyneuropathy, without long-term current use of insulin ([place])  ? [facility] [name], PA-C  ? 1 year ago Degenerative disc disease, cervical  ? [facility] [name], PA-C  ? 1 year ago Viral upper respiratory tract infection  ? [facility] Lebron Conners D, MD  ? 1 year ago Mixed hyperlipidemia  ? [facility] [name], [place]  ?  ?  ? ?  ?  ?  ? ? ?

## 2022-01-20 ENCOUNTER — Encounter: Payer: Self-pay | Admitting: Family Medicine

## 2022-02-22 ENCOUNTER — Ambulatory Visit (INDEPENDENT_AMBULATORY_CARE_PROVIDER_SITE_OTHER): Payer: Commercial Managed Care - HMO | Admitting: Family Medicine

## 2022-02-22 ENCOUNTER — Encounter: Payer: Self-pay | Admitting: Family Medicine

## 2022-02-22 VITALS — BP 108/72 | HR 91 | Temp 97.9°F | Resp 14 | Ht 61.0 in | Wt 128.5 lb

## 2022-02-22 DIAGNOSIS — Z0000 Encounter for general adult medical examination without abnormal findings: Secondary | ICD-10-CM | POA: Diagnosis not present

## 2022-02-22 DIAGNOSIS — E1142 Type 2 diabetes mellitus with diabetic polyneuropathy: Secondary | ICD-10-CM | POA: Diagnosis not present

## 2022-02-22 DIAGNOSIS — Z1211 Encounter for screening for malignant neoplasm of colon: Secondary | ICD-10-CM | POA: Diagnosis not present

## 2022-02-22 DIAGNOSIS — Z114 Encounter for screening for human immunodeficiency virus [HIV]: Secondary | ICD-10-CM

## 2022-02-22 DIAGNOSIS — Z780 Asymptomatic menopausal state: Secondary | ICD-10-CM

## 2022-02-22 DIAGNOSIS — Z1231 Encounter for screening mammogram for malignant neoplasm of breast: Secondary | ICD-10-CM | POA: Diagnosis not present

## 2022-02-22 DIAGNOSIS — Z1159 Encounter for screening for other viral diseases: Secondary | ICD-10-CM

## 2022-02-22 NOTE — Progress Notes (Signed)
Patient: Savannah Washington, Female    DOB: 12-03-1960, 61 y.o.   MRN: [id number] [name], PA-C Visit Date: 02/22/2022  Today's Provider: [name], PA-C   Chief Complaint  Patient presents with   Annual Exam   Subjective:   Annual physical exam:  Savannah Washington is a 61 y.o. female who presents today for complete physical exam:  Exercise/Activity:  not very active except for at work Diet/nutrition:  no specific diet Sleep:  no concerns  SDOH Screenings   Alcohol Screen: Low Risk    Last Alcohol Screening Score (AUDIT): 5  Depression (PHQ2-9): Medium Risk   PHQ-2 Score: 5  Financial Resource Strain: Low Risk    Difficulty of Paying Living Expenses: Not hard at all  Food Insecurity: No Food Insecurity   Worried About Charity fundraiser in the Last Year: Never true   [place] in the Last Year: Never true  Housing: Low Risk    Last Housing Risk Score: 0  Physical Activity: Inactive   Days of Exercise per Week: 0 days   Minutes of Exercise per Session: 0 min  Social Connections: Socially Isolated   Frequency of Communication with Friends and Family: Twice a week   Frequency of Social Gatherings with Friends and Family: Twice a week   Attends Religious Services: Never   Marine scientist or Organizations: No   Attends Archivist Meetings: Never   Marital Status: Widowed  Stress: No Stress Concern Present   Feeling of Stress : Only a little  Tobacco Use: High Risk   Smoking Tobacco Use: Every Day   Smokeless Tobacco Use: Never   Passive Exposure: Not on file  Transportation Needs: No Transportation Needs   Lack of Transportation (Medical): No   Lack of Transportation (Non-Medical): No   Due do routine f/up on chronic conditions today - needs vascepa - new insurance  Advised pt of separate visit billing/coding  USPSTF grade A and B recommendations - reviewed and addressed today  Depression:  Phq 9 completed today by patient, was  reviewed by me with patient in the room PHQ score is positive, pt feels overall mood is okay for what she's been through    02/22/2022    9:00 AM 01/22/2021    2:45 PM 06/12/2020   10:40 AM 03/01/2020    1:50 PM  PHQ 2/9 Scores  PHQ - 2 Score 2 2 0 1  PHQ- 9 Score _0 02/22/2022    9:00 AM 01/22/2021    2:45 PM 06/12/2020   10:40 AM 03/01/2020    1:50 PM 02/10/2020    9:41 AM  Depression screen PHQ 2/9  Decreased Interest 1 1 0 0 0  Down, Depressed, Hopeless 1 1 0 1 0  PHQ - 2 Score 2 2 0 1 0  Altered sleeping 1 2  0 1  Tired, decreased energy _1 0  Change in appetite 1 1  0 0  Feeling bad or failure about yourself  0 0  0 0  Trouble concentrating 0 0  0 0  Moving slowly or fidgety/restless 0 1  0 0  Suicidal thoughts 0 0  0 0  PHQ-9 Score _2 Difficult doing work/chores Not difficult at all Somewhat difficult  Not difficult at all Not difficult at all    Alcohol screening: [place] Office Visit [facility] 02/22/20[facility]  [facility]  AUDIT-C Score 5       Immunizations and Health Maintenance: Health Maintenance  Topic Date Due   Hepatitis C Screening  Never done   HEMOGLOBIN A1C  05/21/2021   COLONOSCOPY (Pts 45-55yr Insurance coverage will need to be confirmed)  09/30/2021   MAMMOGRAM  01/29/2022   OPHTHALMOLOGY EXAM  02/22/2022 (Originally 04/10/2021)   COVID-19 Vaccine (1) 03/10/2022 (Originally [birth year]31/1960)   Zoster Vaccines- Shingrix (1 of 2) 05/25/2022 (Originally 01/28/2010)   TETANUS/TDAP  02/23/2023 (Originally 01/29/1979)   INFLUENZA VACCINE  04/30/2022   FOOT EXAM  02/23/2023   PAP SMEAR-Modifier  05/09/2023   HIV Screening  Completed   HPV VACCINES  Aged Out     Hep C Screening: due  STD testing and prevention (HIV/chl/gon/syphilis):  see above, no additional testing desired by pt today  - not sexually active Husband past 7 years ago   Intimate partner violence: safe   Sexual History/Pain during Intercourse:  Widowed  Menstrual History/LMP/Abnormal Bleeding:  none No LMP recorded. Patient is postmenopausal.  Incontinence Symptoms:   stress incontinence - pad  Breast cancer:  Sister with BCA Last Mammogram: *see HM list above BRCA gene screening: none known   Cervical cancer screening: due in 2024, cotesting done and neg, low risk  Pt denies other family hx of cancers - breast, ovarian, uterine, colon:     Osteoporosis:   Discussion on osteoporosis per age, including high calcium and vitamin D supplementation, weight bearing exercises Pt is  supplementing with daily calcium/Vit D. Unknown last Bone scan/dexa Roughly experienced menopause at age 89 Skin cancer:  Hx of skin CA -  NO Discussed atypical lesions   Colorectal cancer:   Colonoscopy is due   Discussed concerning signs and sx of CRC, pt denies melena, hematochezia, change in bowels  Lung cancer:   Low Dose CT Chest recommended if Age 62-80years, 20 pack-year currently smoking OR have quit w/in 15years. Patient does qualify.    Social History   Tobacco Use   Smoking status: Every Day    Packs/day: 0.50    Years: 44.00    Pack years: 22.00    Types: Cigarettes    Start date: 08/30/2018   Smokeless tobacco: Never  Vaping Use   Vaping Use: Former  Substance Use Topics   Alcohol use: Yes    Alcohol/week: 5.0 standard drinks    Types: 1 Glasses of wine, 4 Cans of beer per week   Drug use: No     Flowsheet Row [facility] AUD[facility]-C Score 5       Family History  Problem Relation Age of Onset   Dementia Mother    Breast cancer Sister 531  Aneurysm Father    Heart attack Brother    Heart defect Brother    Brain cancer Daughter    Heart attack Paternal Grandfather    Drug abuse Sister      Blood pressure/Hypertension: BP Readings from Last 3 Encounters:  02/22/22 108/72  03/26/21 122/74  01/22/21 128/78    Weight/Obesity: Wt Readings from Last 3  Encounters:  02/22/22 128 lb 8 oz (58.3 kg)  04/16/21 129 lb (58.5 kg)  03/26/21 129 lb (58.5 kg)   BMI Readings from Last 3 Encounters:  02/22/22 24.28 kg/m  04/16/21 23.59 kg/m  03/26/21 23.59 kg/m     Lipids:  Lab Results  Component Value Date   CHOL 134  03/26/2021   CHOL 175 03/06/2020   CHOL 198 03/24/2019   Lab Results  Component Value Date   HDL 81 03/26/2021   HDL 61 03/06/2020   HDL 48 (L) 03/24/2019   Lab Results  Component Value Date   LDLCALC 38 03/26/2021   LDLCALC 86 03/06/2020   [place]  03/24/2019     Comment:     . LDL cholesterol not calculated. Triglyceride levels greater than 400 mg/dL invalidate calculated LDL results. . Reference range: <100 . Desirable range <100 mg/dL for primary prevention;   <70 mg/dL for patients with CHD or diabetic patients  with > or = 2 CHD risk factors. Marland Kitchen LDL-C is now calculated using the Martin-Hopkins  calculation, which is a validated novel method providing  better accuracy than the Friedewald equation in the  estimation of LDL-C.  Cresenciano Genre et al. Annamaria Helling. 4665;993(57): 2061-2068  (http://education.[facility].com/faq/FAQ164)    Lab Results  Component Value Date   TRIG 71 03/26/2021   TRIG 183 (H) 03/06/2020   TRIG 583 (H) 03/24/2019   Lab Results  Component Value Date   CHOLHDL 1.7 03/26/2021   CHOLHDL 2.9 03/06/2020   CHOLHDL 4.1 03/24/2019   No results found for: LDLDIRECT Based on the results of lipid panel his/her cardiovascular risk factor ( using [facility] )  in the next 10 years is: The 10-year ASCVD risk score (Arnett DK, et al., 2019) is: 9%   Values used to calculate the score:     Age: 61 years     Sex: Female     Is Non-Hispanic African American: No     Diabetic: Yes     Tobacco smoker: Yes     Systolic Blood Pressure: 017 mmHg     Is BP treated: Yes     HDL Cholesterol: 81 mg/dL     Total Cholesterol: 134 mg/dL  Glucose:  Glucose  Date Value Ref Range Status   11/25/2011 123 (H) 65 - 99 mg/dL Final   Glucose, Bld  Date Value Ref Range Status  03/26/2021 88 65 - 99 mg/dL Final    Comment:    .            Fasting reference interval .   11/21/2020 100 (H) 65 - 99 mg/dL Final    Comment:    .            Fasting reference interval . For someone without known diabetes, a glucose value between 100 and 125 mg/dL is consistent with prediabetes and should be confirmed with a follow-up test. .   06/12/2020 136 (H) 65 - 99 mg/dL Final    Comment:    .            Fasting reference interval . For someone without known diabetes, a glucose value >125 mg/dL indicates that they may have diabetes and this should be confirmed with a follow-up test. .     Advanced Care Planning:  A voluntary discussion about advance care planning including the explanation and discussion of advance directives.   Discussed health care proxy and Living will, and the patient was able to identify a health care proxy as son Savannah Washington.   Patient does not have a living will at present time.   Social History       Social History   Socioeconomic History   Marital status: Widowed    Spouse name: Not on file   Number of children: Not on file   Years of  education: Not on file   Highest education level: Not on file  Occupational History   Not on file  Tobacco Use   Smoking status: Every Day    Packs/day: 0.50    Years: 44.00    Pack years: 22.00    Types: Cigarettes    Start date: 08/30/2018   Smokeless tobacco: Never  Vaping Use   Vaping Use: Former  Substance and Sexual Activity   Alcohol use: Yes    Alcohol/week: 5.0 standard drinks    Types: 1 Glasses of wine, 4 Cans of beer per week   Drug use: No   Sexual activity: Not Currently    Birth control/protection: Post-menopausal  Other Topics Concern   Not on file  Social History Narrative   Not on file   Social Determinants of Health   Financial Resource Strain: Low Risk    Difficulty of  Paying Living Expenses: Not hard at all  Food Insecurity: No Food Insecurity   Worried About Charity fundraiser in the Last Year: Never true   Ran Out of Food in the Last Year: Never true  Transportation Needs: No Transportation Needs   Lack of Transportation (Medical): No   Lack of Transportation (Non-Medical): No  Physical Activity: Inactive   Days of Exercise per Week: 0 days   Minutes of Exercise per Session: 0 min  Stress: No Stress Concern Present   Feeling of Stress : Only a little  Social Connections: Socially Isolated   Frequency of Communication with Friends and Family: Twice a week   Frequency of Social Gatherings with Friends and Family: Twice a week   Attends Religious Services: Never   Marine scientist or Organizations: No   Attends Archivist Meetings: Never   Marital Status: Widowed    Family History        Family History  Problem Relation Age of Onset   Dementia Mother    Breast cancer Sister 70   Aneurysm Father    Heart attack Brother    Heart defect Brother    Brain cancer Daughter    Heart attack Paternal Grandfather    Drug abuse Sister     Patient Active Problem List   Diagnosis Date Noted   Current mild episode of major depressive disorder, unspecified whether recurrent ([place]) 02/12/2021   Type 2 diabetes mellitus with diabetic polyneuropathy, without long-term current use of insulin ([place]) 02/04/2020   Tobacco abuse 12/01/2018   Aortic atherosclerosis ([place]) 12/29/2017   Hepatic steatosis 12/29/2017   COPD (chronic obstructive pulmonary disease) ([place]) 12/05/2017   Degenerative disc disease, cervical 09/04/2017   Cervical arthritis 09/04/2017   Muscle spasm of left shoulder area 09/03/2017   Elevated platelet count 04/11/2017   Marijuana use 03/17/2017   Prediabetes 03/14/2017   Essential hypertension, benign 03/14/2017   Hyperlipidemia 03/14/2017   Anxiety disorder 03/14/2017    History reviewed. No pertinent surgical  history.   Current Outpatient Medications:    albuterol (PROAIR HFA) 108 (90 Base) MCG/ACT inhaler, Inhale 2 puffs into the lungs every 6 (six) hours as needed for wheezing or shortness of breath., Disp: 1 each, Rfl: 5   atorvastatin (LIPITOR) 80 MG tablet, Take 1 tablet (80 mg total) by mouth at bedtime., Disp: 90 tablet, Rfl: 3   buPROPion (WELLBUTRIN XL) 300 MG 24 hr tablet, Take 1 tablet by mouth once daily, Disp: 90 tablet, Rfl: 3   busPIRone (BUSPAR) 15 MG tablet, Take 1 tablet (15 mg total)  by mouth 2 (two) times daily., Disp: 180 tablet, Rfl: 3   cetirizine (ZYRTEC) 10 MG tablet, Take 10 mg by mouth daily., Disp: , Rfl:    Fluticasone-Umeclidin-Vilant (TRELEGY ELLIPTA) 100-62.5-25 MCG/INH AEPB, Inhale 1 each into the lungs daily., Disp: 1 each, Rfl: 12   ibuprofen (ADVIL) 600 MG tablet, TAKE 1 TABLET BY MOUTH THREE TIMES DAILY, Disp: 30 tablet, Rfl: 0   ipratropium-albuterol (DUONEB) 0.5-2.5 (3) MG/3ML SOLN, Take 3 mLs by nebulization every 8 (eight) hours as needed., Disp: 180 mL, Rfl: 1   lisinopril (ZESTRIL) 5 MG tablet, Take 1 tablet (5 mg total) by mouth daily., Disp: 90 tablet, Rfl: 3   meloxicam (MOBIC) 15 MG tablet, Take 1 tablet by mouth once daily, Disp: 90 tablet, Rfl: 3   metFORMIN (GLUCOPHAGE) 1000 MG tablet, Take 1 tablet (1,000 mg total) by mouth 2 (two) times daily with a meal., Disp: 180 tablet, Rfl: 0   methocarbamol (ROBAXIN) 500 MG tablet, Take 1 tablet by mouth three times daily as needed for muscle spasm, Disp: 90 tablet, Rfl: 0   montelukast (SINGULAIR) 10 MG tablet, Take 1 tablet (10 mg total) by mouth at bedtime., Disp: 30 tablet, Rfl: 3   Multiple Vitamins-Minerals (MULTI COMPLETE PO), Take 1,000 Units by mouth daily., Disp: , Rfl:    nystatin (MYCOSTATIN/NYSTOP) powder, APPLY  POWDER TOPICALLY TO AFFECTED AREA THREE TIMES DAILY AS NEEDED, Disp: 60 g, Rfl: 0   pregabalin (LYRICA) 25 MG capsule, TAKE 1 CAPSULE BY MOUTH TWICE DAILY FOR  CHRONIC  NECK  PAIN  OR  MSK   PAIN, Disp: 180 capsule, Rfl: 0   Turmeric 500 MG CAPS, Take 500 mg by mouth daily., Disp: , Rfl:    VASCEPA 1 g capsule, Take 2 capsules by mouth twice daily (Patient not taking: Reported on 02/22/2022), Disp: 90 capsule, Rfl: 0  No Known Allergies  Patient Care Team: [name], PA-C as PCP - General (Family Medicine)   Chart Review: I personally reviewed active problem list, medication list, allergies, family history, social history, health maintenance, notes from last encounter, lab results, imaging with the patient/caregiver today.   Review of Systems  Constitutional: Negative.   HENT: Negative.    Eyes: Negative.   Respiratory: Negative.    Cardiovascular: Negative.   Gastrointestinal: Negative.   Endocrine: Negative.   Genitourinary: Negative.   Musculoskeletal: Negative.   Skin: Negative.   Allergic/Immunologic: Negative.   Neurological: Negative.   Hematological: Negative.   Psychiatric/Behavioral: Negative.    All other systems reviewed and are negative.        Objective:   Vitals:  Vitals:   02/22/22 0857  BP: 108/72  Pulse: 91  Resp: 14  Temp: 97.9 F (36.6 C)  TempSrc: Oral  SpO2: 97%  Weight: 128 lb 8 oz (58.3 kg)  Height: _0  (1.549 m)    Body mass index is 24.28 kg/m.  Physical Exam Vitals and nursing note reviewed.  Constitutional:      General: She is not in acute distress.    Appearance: Normal appearance. She is well-developed. She is not ill-appearing, toxic-appearing or diaphoretic.  HENT:     Head: Normocephalic and atraumatic.     Right Ear: Tympanic membrane, ear canal and external ear normal. There is no impacted cerumen.     Left Ear: Tympanic membrane, ear canal and external ear normal. There is no impacted cerumen.     Nose: Nose normal. No congestion or rhinorrhea.     Mouth/Throat:  Mouth: Mucous membranes are moist.     Pharynx: Oropharynx is clear. Uvula midline. Posterior oropharyngeal erythema present. No  oropharyngeal exudate.  Eyes:     General: Lids are normal. No scleral icterus.       Right eye: No discharge.        Left eye: No discharge.     Conjunctiva/sclera: Conjunctivae normal.     Pupils: Pupils are equal, round, and reactive to light.  Neck:     Trachea: Phonation normal. No tracheal deviation.  Cardiovascular:     Rate and Rhythm: Normal rate and regular rhythm.     Pulses: Normal pulses.          Radial pulses are 2+ on the right side and 2+ on the left side.       Posterior tibial pulses are 2+ on the right side and 2+ on the left side.     Heart sounds: Normal heart sounds. No murmur heard.   No friction rub. No gallop.  Pulmonary:     Effort: Pulmonary effort is normal. No respiratory distress.     Breath sounds: Normal breath sounds. No stridor. No wheezing, rhonchi or rales.  Chest:     Chest wall: No tenderness.  Abdominal:     General: Bowel sounds are normal. There is no distension.     Palpations: Abdomen is soft.     Tenderness: There is no abdominal tenderness. There is no guarding or rebound.  Musculoskeletal:     Cervical back: Normal range of motion and neck supple.  Lymphadenopathy:     Cervical: No cervical adenopathy.  Skin:    General: Skin is warm and dry.     Capillary Refill: Capillary refill takes less than 2 seconds.     Coloration: Skin is not jaundiced or pale.     Findings: No bruising or rash.  Neurological:     Mental Status: She is alert. Mental status is at baseline.     Motor: No abnormal muscle tone.     Gait: Gait normal.  Psychiatric:        Mood and Affect: Mood normal.        Speech: Speech normal.        Behavior: Behavior normal.      Fall Risk:    02/22/2022    9:00 AM 01/22/2021    2:43 PM 06/12/2020   10:39 AM 03/01/2020    1:50 PM 02/10/2020    9:41 AM  Fall Risk   Falls in the past year? 0 1 0 0 0  Number falls in past yr:  0 0 0 0  Injury with Fall?  1 0 0 0  Risk for fall due to : No Fall Risks      Follow  up Falls prevention discussed  Falls evaluation completed      Functional Status Survey: Is the patient deaf or have difficulty hearing?: No Does the patient have difficulty seeing, even when wearing glasses/contacts?: Yes Does the patient have difficulty concentrating, remembering, or making decisions?: No Does the patient have difficulty walking or climbing stairs?: Yes Does the patient have difficulty dressing or bathing?: No Does the patient have difficulty doing errands alone such as visiting a doctor's office or shopping?: No   Assessment & Plan:    CPE completed today  USPSTF grade A and B recommendations reviewed with patient; age-appropriate recommendations, preventive care, screening tests, etc discussed and encouraged; healthy living encouraged; see AVS for patient education  given to patient  Discussed importance of 150 minutes of physical activity weekly, AHA exercise recommendations given to pt in AVS/handout  Discussed importance of healthy diet:  eating lean meats and proteins, avoiding trans fats and saturated fats, avoid simple sugars and excessive carbs in diet, eat 6 servings of fruit/vegetables daily and drink plenty of water and avoid sweet beverages.    Recommended pt to do annual eye exam and routine dental exams/cleanings  Depression, alcohol, fall screening completed as documented above and per flowsheets  Advance Care planning information and packet discussed and offered today, encouraged pt to discuss with family members/spouse/partner/friends and complete Advanced directive packet and bring copy to office   Reviewed Health Maintenance: Health Maintenance  Topic Date Due   Hepatitis C Screening  Never done   HEMOGLOBIN A1C  05/21/2021   COLONOSCOPY (Pts 45-22yr Insurance coverage will need to be confirmed)  09/30/2021   MAMMOGRAM  01/29/2022   OPHTHALMOLOGY EXAM  02/22/2022 (Originally 04/10/2021)   COVID-19 Vaccine (1) 03/10/2022 (Originally [birth year]31/1960)    Zoster Vaccines- Shingrix (1 of 2) 05/25/2022 (Originally 01/28/2010)   TETANUS/TDAP  02/23/2023 (Originally 01/29/1979)   INFLUENZA VACCINE  04/30/2022   FOOT EXAM  02/23/2023   PAP SMEAR-Modifier  05/09/2023   HIV Screening  Completed   HPV VACCINES  Aged Out    Immunizations: Immunization History  Administered Date(s) Administered   Influenza,inj,Quad PF,6+ Mos 07/16/2019, 06/12/2020   PPD Test 06/05/2021   Pneumococcal Polysaccharide-23 03/06/2018   Vaccines:  HPV: up to at age 450, ask insurance if age between 217-45 Shingrix: 531-64yo and ask insurance if covered when patient above 617yo - needed, declined Pneumonia: could do Pneumovax 20 Flu:  educated and discussed with patient.     ICD-10-CM   1. Adult general medical exam  Z00.00 HIV antibody (with reflex)    Hepatitis C Antibody    HgB A1c    MM Digital Screening    DG Bone Density    CBC with Differential/Platelet    COMPLETE METABOLIC PANEL WITH GFR    Lipid panel    Microalbumin, urine    2. Type 2 diabetes mellitus with diabetic polyneuropathy, without long-term current use of insulin (HCC)  E11.42 HgB A1c    COMPLETE METABOLIC PANEL WITH GFR   foot exam done - will do routine OV/f/up to address labs and tx    3. Colon cancer screening  Z12.11 Ambulatory referral to Gastroenterology    4. Breast cancer screening by mammogram  Z12.31 HIV antibody (with reflex)    MM Digital Screening    5. Screening for HIV (human immunodeficiency virus)  Z11.4 HIV antibody (with reflex)   low risk    6. Encounter for hepatitis C screening test for low risk patient  Z11.59 Hepatitis C Antibody    7. Postmenopausal estrogen deficiency  Z78.0 DG Bone Density   menopause 20 years ago, smoker, suspect she is higher risk, screening dexa          LDelsa Grana PA-C 02/22/22 10:00 AM  [facility]

## 2022-02-26 ENCOUNTER — Encounter: Payer: Self-pay | Admitting: Family Medicine

## 2022-02-26 LAB — HEMOGLOBIN A1C
Hgb A1c MFr Bld: 5.5 % of total Hgb (ref <5.7)
Mean Plasma Glucose: 111 mg/dL
eAG (mmol/L): 6.2 mmol/L

## 2022-02-26 LAB — CBC WITH DIFFERENTIAL/PLATELET
Absolute Monocytes: 723 cells/uL (ref 200–950)
Basophils Absolute: 77 cells/uL (ref 0–200)
Basophils Relative: 0.9 %
Eosinophils Absolute: 264 cells/uL (ref 15–500)
Eosinophils Relative: 3.1 %
HCT: 46.3 % — ABNORMAL HIGH (ref 35.0–45.0)
Hemoglobin: 15.6 g/dL — ABNORMAL HIGH (ref 11.7–15.5)
Lymphs Abs: 2278 cells/uL (ref 850–3900)
MCH: 31.1 pg (ref 27.0–33.0)
MCHC: 33.7 g/dL (ref 32.0–36.0)
MCV: 92.4 fL (ref 80.0–100.0)
MPV: 8.4 fL (ref 7.5–12.5)
Monocytes Relative: 8.5 %
Neutro Abs: 5160 cells/uL (ref 1500–7800)
Neutrophils Relative %: 60.7 %
Platelets: 474 10*3/uL — ABNORMAL HIGH (ref 140–400)
RBC: 5.01 10*6/uL (ref 3.80–5.10)
RDW: 13.1 % (ref 11.0–15.0)
Total Lymphocyte: 26.8 %
WBC: 8.5 10*3/uL (ref 3.8–10.8)

## 2022-02-26 LAB — HEPATITIS C ANTIBODY
Hepatitis C Ab: NONREACTIVE
SIGNAL TO CUT-OFF: 0.17 (ref <1.00)

## 2022-02-26 LAB — COMPLETE METABOLIC PANEL WITH GFR
AG Ratio: 1.8 (calc) (ref 1.0–2.5)
ALT: 15 U/L (ref 6–29)
AST: 33 U/L (ref 10–35)
Albumin: 4.5 g/dL (ref 3.6–5.1)
Alkaline phosphatase (APISO): 56 U/L (ref 37–153)
BUN: 9 mg/dL (ref 7–25)
CO2: 28 mmol/L (ref 20–32)
Calcium: 9.9 mg/dL (ref 8.6–10.4)
Chloride: 99 mmol/L (ref 98–110)
Creat: 0.64 mg/dL (ref 0.50–1.05)
Globulin: 2.5 g/dL (calc) (ref 1.9–3.7)
Glucose, Bld: 77 mg/dL (ref 65–99)
Potassium: 4.8 mmol/L (ref 3.5–5.3)
Sodium: 136 mmol/L (ref 135–146)
Total Bilirubin: 0.5 mg/dL (ref 0.2–1.2)
Total Protein: 7 g/dL (ref 6.1–8.1)
eGFR: 100 mL/min/{1.73_m2} (ref >=60)

## 2022-02-26 LAB — LIPID PANEL
Cholesterol: 151 mg/dL (ref <200)
HDL: 79 mg/dL (ref >=50)
LDL Cholesterol (Calc): 44 mg/dL (calc)
Non-HDL Cholesterol (Calc): 72 mg/dL (calc) (ref <130)
Total CHOL/HDL Ratio: 1.9 (calc) (ref <5.0)
Triglycerides: 224 mg/dL — ABNORMAL HIGH (ref <150)

## 2022-02-26 LAB — MICROALBUMIN, URINE: Microalb, Ur: 0.3 mg/dL

## 2022-02-26 LAB — HIV ANTIBODY (ROUTINE TESTING W REFLEX): HIV 1&2 Ab, 4th Generation: NONREACTIVE

## 2022-03-10 ENCOUNTER — Other Ambulatory Visit: Payer: Self-pay | Admitting: Family Medicine

## 2022-03-10 DIAGNOSIS — I10 Essential (primary) hypertension: Secondary | ICD-10-CM

## 2022-03-11 ENCOUNTER — Encounter: Payer: Self-pay | Admitting: Family Medicine

## 2022-03-14 IMAGING — MG MM DIGITAL SCREENING BILAT W/ TOMO AND CAD
6 of 10 series · 6 of 30 positions shown · non-contrast
Comparison: Previous exam(s).

CLINICAL DATA: Screening.

EXAM:
DIGITAL SCREENING BILATERAL MAMMOGRAM WITH TOMOSYNTHESIS AND CAD
TECHNIQUE: Bilateral screening digital craniocaudal and mediolateral oblique
mammograms were obtained. Bilateral screening digital breast
tomosynthesis was performed. The images were evaluated with
computer-aided detection.

[R MLO synth-2D]
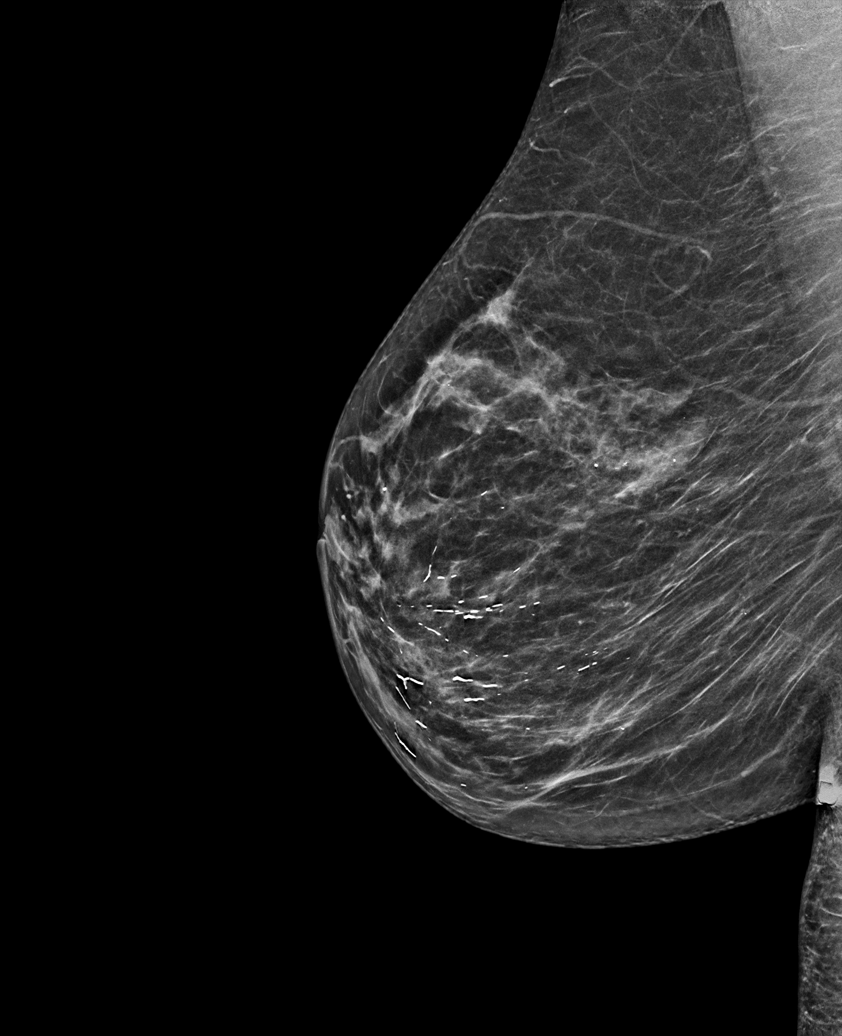

[L MLO synth-2D (1 of 2)]
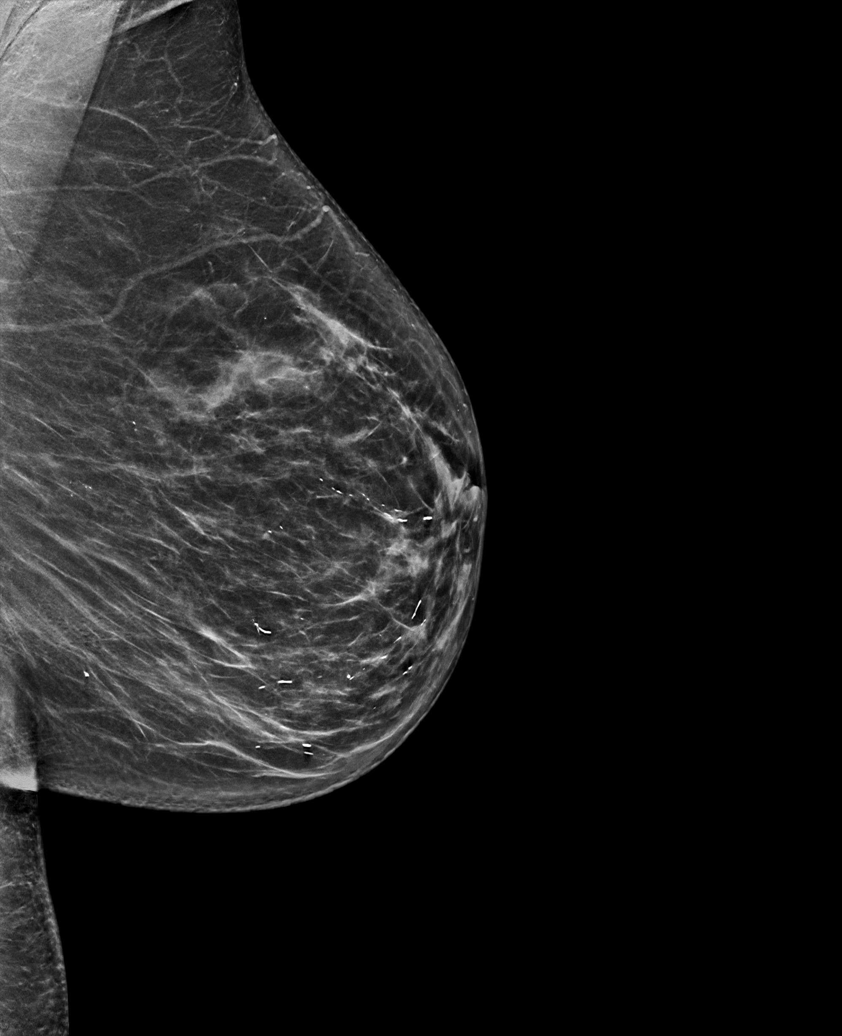

[L CC synth-2D]
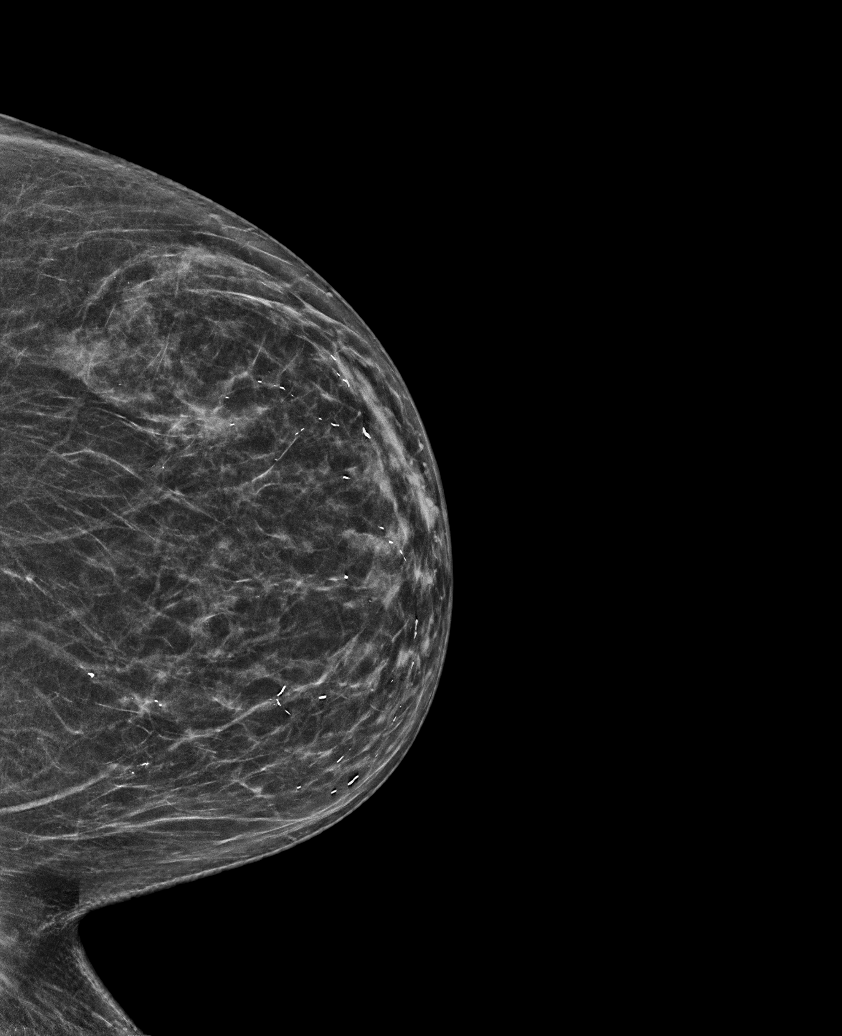

[L MLO synth-2D (2 of 2)]
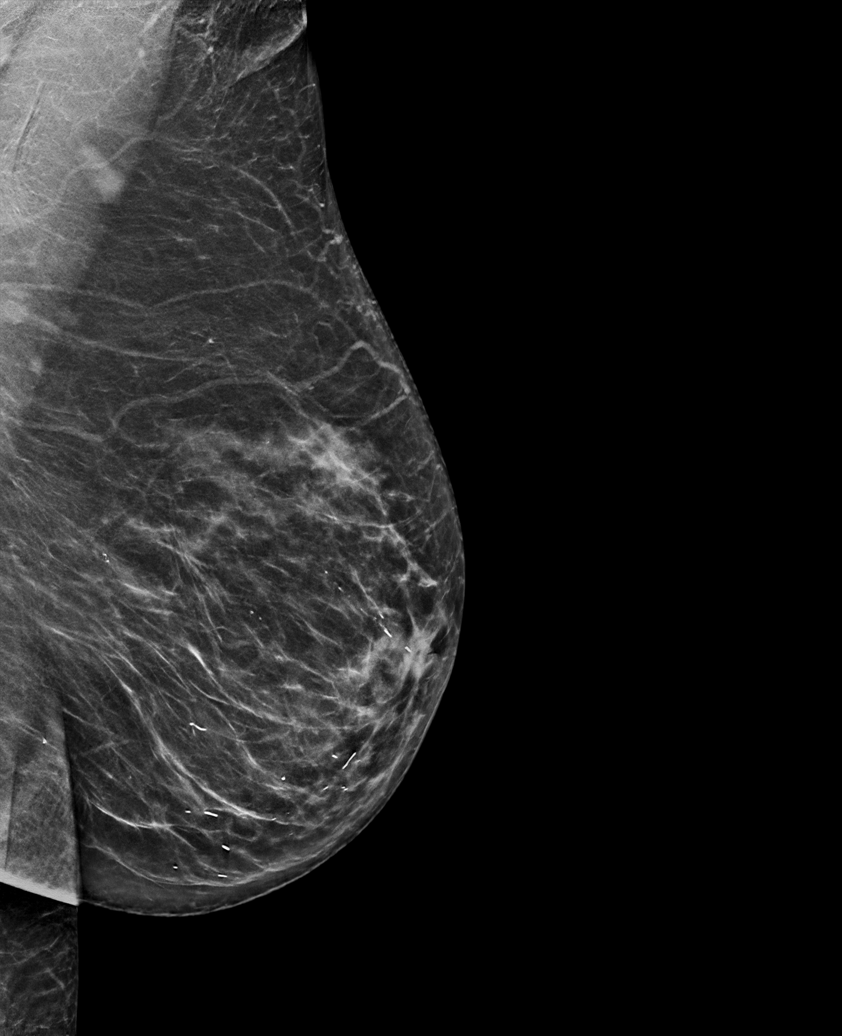

[R CC synth-2D]
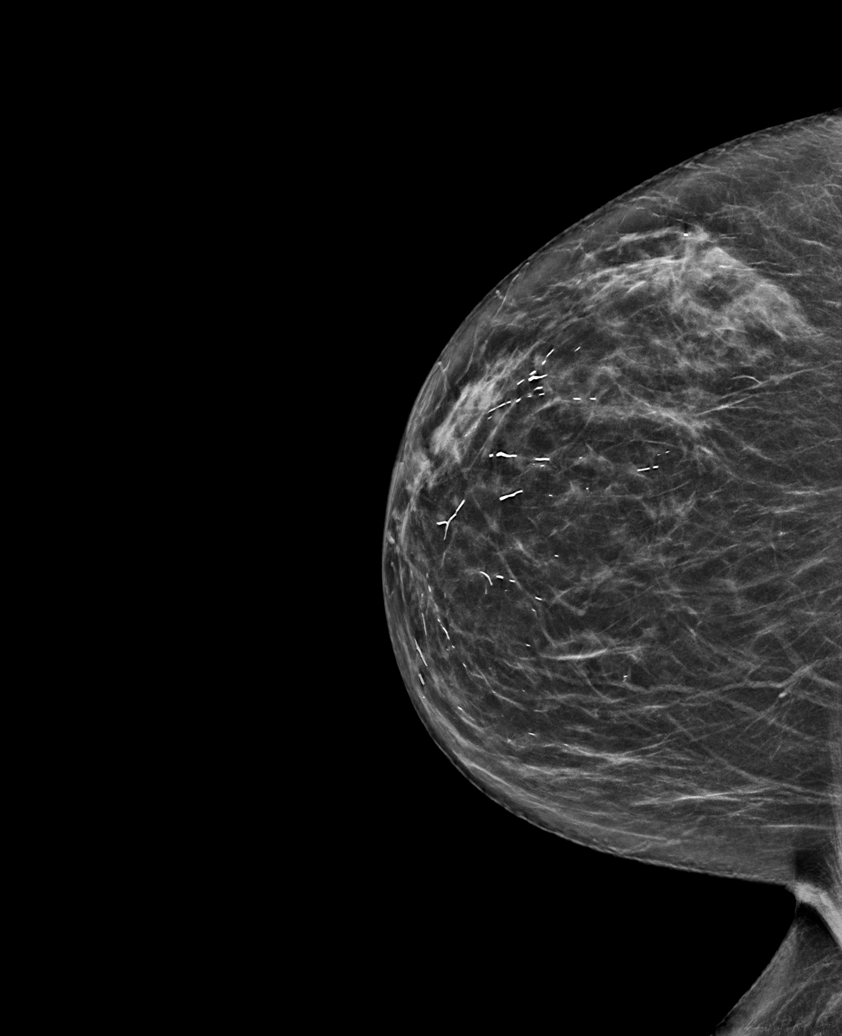

[R CC tomo · tomo slice 34/67.0]
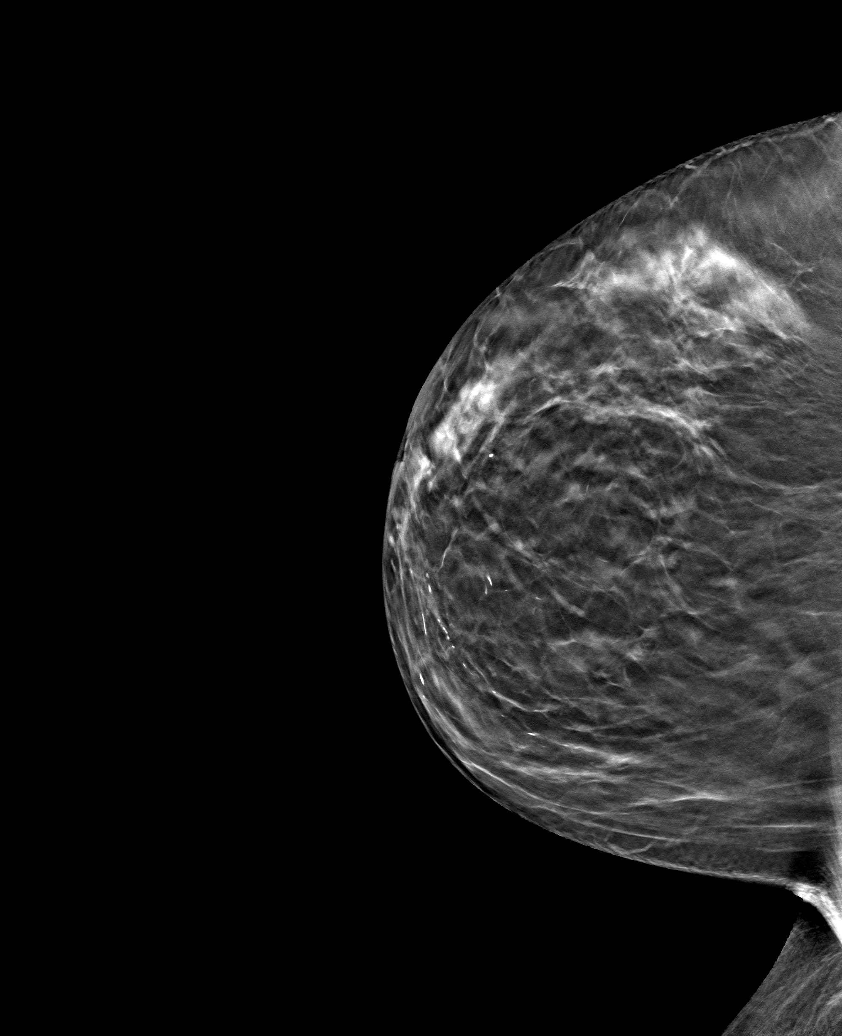

[6 of 30 positions shown; findings below may reference images not displayed]

ACR Breast Density Category b: There are scattered areas of
fibroglandular density.
FINDINGS: There are no findings suspicious for malignancy. The images were
evaluated with computer-aided detection.
IMPRESSION: No mammographic evidence of malignancy. A result letter of this
screening mammogram will be mailed directly to the patient.

RECOMMENDATION:
Screening mammogram in one year. (Code:[id number])

BI-RADS CATEGORY  1: Negative.

## 2022-04-16 ENCOUNTER — Ambulatory Visit: Admission: RE | Admit: 2022-04-16 | Payer: Commercial Managed Care - HMO | Source: Ambulatory Visit

## 2022-04-23 ENCOUNTER — Ambulatory Visit
Admission: RE | Admit: 2022-04-23 | Discharge: 2022-04-23 | Disposition: A | Discharge status: Home / Self Care | Payer: Commercial Managed Care - HMO | Source: Ambulatory Visit | Attending: Acute Care | Admitting: Acute Care

## 2022-04-23 DIAGNOSIS — F17210 Nicotine dependence, cigarettes, uncomplicated: Secondary | ICD-10-CM | POA: Insufficient documentation

## 2022-04-29 ENCOUNTER — Telehealth: Payer: Self-pay | Admitting: Acute Care

## 2022-04-29 DIAGNOSIS — R911 Solitary pulmonary nodule: Secondary | ICD-10-CM

## 2022-04-29 DIAGNOSIS — Z87891 Personal history of nicotine dependence: Secondary | ICD-10-CM

## 2022-04-29 NOTE — Telephone Encounter (Signed)
Left message for pt to call back to discuss CT results.   Savannah Washington reviewed CT with Dr Camillo Flaming and she has recommended a 6 mth repeat CT to look at the 9.2 mm nodule that was seen on this CT. Also, ask pt if she has been having any cough/ symptoms of infection. If so, may need PCP follow up.

## 2022-04-29 NOTE — Telephone Encounter (Signed)
Spoke with pt and reviewed lung screening CT results. I advised that there nodules seen that were stable in size compared to her last CT scan but Dr Patsey Berthold has reviewed the scan and feels like we should repeat the CT in 6 months instead of waiting a whole year just to be safe. Pt did state that she keeps a cough but she doesn't feel like it has worsened recently. CT results sent to PCP with follow up plans included. Order placed for 6 mth repeat CT. PT aware we will call her closer to that time to schedule.

## 2022-05-05 ENCOUNTER — Other Ambulatory Visit: Payer: Self-pay | Admitting: Family Medicine

## 2022-05-05 DIAGNOSIS — F418 Other specified anxiety disorders: Secondary | ICD-10-CM

## 2022-05-05 DIAGNOSIS — Z87891 Personal history of nicotine dependence: Secondary | ICD-10-CM

## 2022-05-05 DIAGNOSIS — I10 Essential (primary) hypertension: Secondary | ICD-10-CM

## 2022-05-06 ENCOUNTER — Encounter: Payer: Self-pay | Admitting: Emergency Medicine

## 2022-05-06 DIAGNOSIS — K1120 Sialoadenitis, unspecified: Secondary | ICD-10-CM | POA: Insufficient documentation

## 2022-05-06 DIAGNOSIS — Z79899 Other long term (current) drug therapy: Secondary | ICD-10-CM | POA: Insufficient documentation

## 2022-05-06 DIAGNOSIS — R220 Localized swelling, mass and lump, head: Secondary | ICD-10-CM | POA: Diagnosis not present

## 2022-05-06 DIAGNOSIS — I10 Essential (primary) hypertension: Secondary | ICD-10-CM | POA: Insufficient documentation

## 2022-05-06 DIAGNOSIS — K111 Hypertrophy of salivary gland: Secondary | ICD-10-CM | POA: Diagnosis not present

## 2022-05-06 NOTE — ED Triage Notes (Signed)
Pt presents via POV with complaints of rights sided facial swelling that started earlier today. Pt states that the swelling has improved since arriving but has visible swelling in her right cheek compared to her left. Pt denies pain or difficulty swallowing.

## 2022-05-07 ENCOUNTER — Emergency Department
Admission: EM | Admit: 2022-05-07 | Discharge: 2022-05-07 | Disposition: A | Discharge status: Home / Self Care | Payer: 59 | Attending: Emergency Medicine | Admitting: Emergency Medicine

## 2022-05-07 ENCOUNTER — Emergency Department: Payer: 59

## 2022-05-07 DIAGNOSIS — K1120 Sialoadenitis, unspecified: Secondary | ICD-10-CM

## 2022-05-07 DIAGNOSIS — K111 Hypertrophy of salivary gland: Secondary | ICD-10-CM | POA: Diagnosis not present

## 2022-05-07 DIAGNOSIS — R220 Localized swelling, mass and lump, head: Secondary | ICD-10-CM | POA: Diagnosis not present

## 2022-05-07 LAB — CBC WITH DIFFERENTIAL/PLATELET
Abs Immature Granulocytes: 0.04 10*3/uL (ref 0.00–0.07)
Basophils Absolute: 0.1 10*3/uL (ref 0.0–0.1)
Basophils Relative: 1 %
Eosinophils Absolute: 0.5 10*3/uL (ref 0.0–0.5)
Eosinophils Relative: 4 %
HCT: 39.3 % (ref 36.0–46.0)
Hemoglobin: 13 g/dL (ref 12.0–15.0)
Immature Granulocytes: 0 %
Lymphocytes Relative: 30 %
Lymphs Abs: 3.2 10*3/uL (ref 0.7–4.0)
MCH: 30.9 pg (ref 26.0–34.0)
MCHC: 33.1 g/dL (ref 30.0–36.0)
MCV: 93.3 fL (ref 80.0–100.0)
Monocytes Absolute: 0.8 10*3/uL (ref 0.1–1.0)
Monocytes Relative: 8 %
Neutro Abs: 6.1 10*3/uL (ref 1.7–7.7)
Neutrophils Relative %: 57 %
Platelets: 422 10*3/uL — ABNORMAL HIGH (ref 150–400)
RBC: 4.21 MIL/uL (ref 3.87–5.11)
RDW: 14.6 % (ref 11.5–15.5)
WBC: 10.7 10*3/uL — ABNORMAL HIGH (ref 4.0–10.5)
nRBC: 0 % (ref 0.0–0.2)

## 2022-05-07 LAB — BASIC METABOLIC PANEL
Anion gap: 9 (ref 5–15)
BUN: 7 mg/dL — ABNORMAL LOW (ref 8–23)
CO2: 25 mmol/L (ref 22–32)
Calcium: 8.9 mg/dL (ref 8.9–10.3)
Chloride: 104 mmol/L (ref 98–111)
Creatinine, Ser: 0.6 mg/dL (ref 0.44–1.00)
GFR, Estimated: >60 mL/min (ref >60)
Glucose, Bld: 110 mg/dL — ABNORMAL HIGH (ref 70–99)
Potassium: 3.6 mmol/L (ref 3.5–5.1)
Sodium: 138 mmol/L (ref 135–145)

## 2022-05-07 MED ORDER — IOHEXOL 300 MG/ML  SOLN
75.0000 mL | Freq: Once | INTRAMUSCULAR | Status: AC | PRN
  Administered 2022-05-07: 75 mL via INTRAVENOUS

## 2022-05-07 NOTE — Discharge Instructions (Addendum)
You may alternate Tylenol 1000 mg every 6 hours as needed for pain, fever and Ibuprofen 800 mg every 6-8 hours as needed for pain, fever.  Please take Ibuprofen with food.  Do not take more than 4000 mg of Tylenol (acetaminophen) in a 24 hour period. ° °

## 2022-05-07 NOTE — ED Provider Notes (Signed)
[facility] Provider Note    Event Date/Time   First MD Initiated Contact with Patient 05/07/22 0207     (approximate)   History   Facial Swelling   HPI  Savannah Washington is a 61 y.o. female with history of hypertension, hyperlipidemia, emphysema who presents emergency department right facial swelling that started today.  States that she noticed it this afternoon and it seemed to improve but then worsened again with eating.  She denies any dental pain but does have multiple dental caries and has had multiple right lower teeth extracted previously.  No fever.  No injury.  No facial redness or warmth.  Fully vaccinated against MMR.  No new exposures.  No swelling anywhere else, hives or rash.   History provided by patient.    Past Medical History:  Diagnosis Date   Anxiety    Aortic atherosclerosis ([place]) 12/29/2017   Chest CT March 2019   Cervical arthritis 09/04/2017   xrays Dec 2018   Degenerative disc disease, cervical 09/04/2017   Xrays Dec 2018   Depression    Emphysema lung ([place]) 12/29/2017   Chest CT March 2019   Hepatic steatosis 12/29/2017   Chest CT March 2019   Hyperlipidemia    Hypertension    Prediabetes 03/14/2017    History reviewed. No pertinent surgical history.  MEDICATIONS:  Prior to Admission medications   Medication Sig Start Date End Date Taking? Authorizing Provider  albuterol (PROAIR HFA) 108 (90 Base) MCG/ACT inhaler Inhale 2 puffs into the lungs every 6 (six) hours as needed for wheezing or shortness of breath. 01/22/21   [name], PA-C  atorvastatin (LIPITOR) 80 MG tablet Take 1 tablet (80 mg total) by mouth at bedtime. 07/25/21   [name], PA-C  buPROPion (WELLBUTRIN XL) 300 MG 24 hr tablet Take 1 tablet by mouth once daily 05/06/22   [name], PA-C  busPIRone (BUSPAR) 15 MG tablet Take 1 tablet (15 mg total) by mouth 2 (two) times daily. 04/24/20   [name], PA-C  cetirizine (ZYRTEC) 10 MG tablet Take 10 mg by  mouth daily.    [provider]  Fluticasone-Umeclidin-Vilant (TRELEGY ELLIPTA) 100-62.5-25 MCG/INH AEPB Inhale 1 each into the lungs daily. 06/12/20   [name], PA-C  ibuprofen (ADVIL) 600 MG tablet TAKE 1 TABLET BY MOUTH THREE TIMES DAILY 09/27/19   Ancil Boozer, Drue Stager, MD  ipratropium-albuterol (DUONEB) 0.5-2.5 (3) MG/3ML SOLN Take 3 mLs by nebulization every 8 (eight) hours as needed. 10/06/19   [name], PA-C  lisinopril (ZESTRIL) 5 MG tablet Take 1 tablet by mouth once daily 05/06/22   [name], PA-C  meloxicam (MOBIC) 15 MG tablet Take 1 tablet by mouth once daily 01/03/22   [name], PA-C  metFORMIN (GLUCOPHAGE) 1000 MG tablet Take 1 tablet (1,000 mg total) by mouth 2 (two) times daily with a meal. 11/28/21   Mecum, Erin E, PA-C  methocarbamol (ROBAXIN) 500 MG tablet Take 1 tablet by mouth three times daily as needed for muscle spasm 12/26/20   [name], PA-C  montelukast (SINGULAIR) 10 MG tablet Take 1 tablet (10 mg total) by mouth at bedtime. 01/22/21   [name], PA-C  Multiple Vitamins-Minerals (MULTI COMPLETE PO) Take 1,000 Units by mouth daily.    [provider]  nystatin (MYCOSTATIN/NYSTOP) powder APPLY  POWDER TOPICALLY TO AFFECTED AREA THREE TIMES DAILY AS NEEDED 10/29/21   Mecum, Erin E, PA-C  pregabalin (LYRICA) 25 MG capsule TAKE 1 CAPSULE BY MOUTH TWICE  DAILY FOR  CHRONIC  NECK  PAIN  OR  MSK  PAIN 03/13/21   [name], PA-C  Turmeric 500 MG CAPS Take 500 mg by mouth daily.    [provider]  VASCEPA 1 g capsule Take 2 capsules by mouth twice daily Patient not taking: Reported on 02/22/2022 01/14/22   [name], PA-C    Physical Exam   Triage Vital Signs: ED Triage Vitals  Enc Vitals Group     BP 05/06/22 2222 (!) 159/93     Pulse Rate 05/06/22 2222 81     Resp 05/06/22 2222 18     Temp 05/06/22 2222 98.4 F (36.9 C)     Temp Source 05/06/22 2222 Oral     SpO2 05/06/22 2222 98 %     Weight 05/06/22 2220 122 lb (55.3 kg)      Height 05/06/22 2220 '5\' 1"'  (1.549 m)     Head Circumference --      Peak Flow --      Pain Score --      Pain Loc --      Pain Edu? --      Excl. in [place]? --     Most recent vital signs: Vitals:   05/06/22 2222  BP: (!) 159/93  Pulse: 81  Resp: 18  Temp: 98.4 F (36.9 C)  SpO2: 98%    CONSTITUTIONAL: Alert and oriented and responds appropriately to questions. Well-appearing; well-nourished HEAD: Normocephalic, atraumatic EYES: Conjunctivae clear, pupils appear equal, sclera nonicteric ENT: normal nose; moist mucous membranes, patient has right-sided facial swelling without redness, warmth, ecchymosis.  No tenderness over the right face.  Multiple dental caries without obvious signs of dental abscess.  Tongue sits flat in the bottom of the mouth.  No uvular deviation, trismus or drooling.  Normal phonation.  No tonsillar hypertrophy or exudate.  No Ludwig's.  No angioedema. NECK: Supple, normal ROM, no cervical lymphadenopathy CARD: RRR; S1 and S2 appreciated; no murmurs, no clicks, no rubs, no gallops RESP: Normal chest excursion without splinting or tachypnea; breath sounds clear and equal bilaterally; no wheezes, no rhonchi, no rales, no hypoxia or respiratory distress, speaking full sentences ABD/GI: Normal bowel sounds; non-distended; soft, non-tender, no rebound, no guarding, no peritoneal signs BACK: The back appears normal EXT: Normal ROM in all joints; no deformity noted, no edema; no cyanosis SKIN: Normal color for age and race; warm; no rash on exposed skin NEURO: Moves all extremities equally, normal speech PSYCH: The patient's mood and manner are appropriate.   ED Results / Procedures / Treatments   LABS: (all labs ordered are listed, but only abnormal results are displayed) Labs Reviewed  BASIC METABOLIC PANEL - Abnormal; Notable for the following components:      Result Value   Glucose, Bld 110 (*)    BUN 7 (*)    All other components within normal limits  CBC  WITH DIFFERENTIAL/PLATELET - Abnormal; Notable for the following components:   WBC 10.7 (*)    Platelets 422 (*)    All other components within normal limits     EKG:   RADIOLOGY: My personal review and interpretation of imaging: CT of the face shows right parotitis.  I have personally reviewed all radiology reports.   CT Maxillofacial W Contrast  Result Date: 05/07/2022 CLINICAL DATA:  Right facial swelling EXAM: CT MAXILLOFACIAL WITH CONTRAST TECHNIQUE: Multidetector CT imaging of the maxillofacial structures was performed with intravenous contrast. Multiplanar CT image reconstructions were also generated.  RADIATION DOSE REDUCTION: This exam was performed according to the departmental dose-optimization program which includes automated exposure control, adjustment of the mA and/or kV according to patient size and/or use of iterative reconstruction technique. CONTRAST:  17m OMNIPAQUE IOHEXOL 300 MG/ML  SOLN COMPARISON:  None Available. FINDINGS: Osseous: No fracture or mandibular dislocation. No destructive process. Orbits: Negative. No traumatic or inflammatory finding. Sinuses: Clear. Soft tissues: There is moderate subcutaneous soft tissue edema of the right face, predominantly near the parotid gland. The right parotid gland is mildly enlarged relative to the left. No sialolithiasis. No abscess or fluid collection. Limited intracranial: No significant or unexpected finding. IMPRESSION: Moderate subcutaneous soft tissue edema of the right face, predominantly near the parotid gland, likely indicating acute parotitis Electronically Signed   By: KUlyses JarredM.D.   On: 05/07/2022 03:49     PROCEDURES:  Critical Care performed: No      Procedures    IMPRESSION / MDM / ASSESSMENT AND PLAN / ED COURSE  I reviewed the triage vital signs and the nursing notes.    Patient here with right-sided facial swelling.     DIFFERENTIAL DIAGNOSIS (includes but not limited to):   Parotitis,  sialoadenitis, dental abscess, facial cellulitis seems less likely.  Doubt allergic reaction.   Patient's presentation is most consistent with acute presentation with potential threat to life or bodily function.   PLAN: We will obtain CBC, BMP, CT of the face with contrast.  She denies that she is having any pain.   MEDICATIONS GIVEN IN ED: Medications  iohexol (OMNIPAQUE) 300 MG/ML solution 75 mL (75 mLs Intravenous Contrast Given 05/07/22 0334)     ED COURSE: Patient's labs show leukocytosis of 10.7.  Normal electrolytes and renal function.  CT scan reviewed and interpreted by myself and the radiologist and shows acute parotitis.  I do not think that she has mumps given she is fully vaccinated and that this is unilateral.  Less likely due to a bacterial source given no overlying redness, warmth and no fever.  Discussed with patient that this is self-limiting.  No obvious signs of sialoadenitis.  Recommended alternating heat and ice to this area as needed, Tylenol Motrin if she begins developing pain.  Patient is comfortable with this plan.  At this time, I do not feel there is any life-threatening condition present. I reviewed all nursing notes, vitals, pertinent previous records.  All lab and urine results, EKGs, imaging ordered have been independently reviewed and interpreted by myself.  I reviewed all available radiology reports from any imaging ordered this visit.  Based on my assessment, I feel the patient is safe to be discharged home without further emergent workup and can continue workup as an outpatient as needed. Discussed all findings, treatment plan as well as usual and customary return precautions.  They verbalize understanding and are comfortable with this plan.  Outpatient follow-up has been provided as needed.  All questions have been answered.    CONSULTS: No emergent ENT consult at this time.  Patient has no lateral parotitis without pain.  No sign of bacterial infection.  No  abscess.  Hemodynamically stable, well-appearing, nontoxic.   OUTSIDE RECORDS REVIEWED: Reviewed patient's last orthopedic visit with MHessie Knowson 03/20/2020.       FINAL CLINICAL IMPRESSION(S) / ED DIAGNOSES   Final diagnoses:  Right facial swelling  Parotitis     Rx / DC Orders   ED Discharge Orders     None  Note:  This document was prepared using Dragon voice recognition software and may include unintentional dictation errors.   Noemi Bellissimo, Delice Bison, DO 05/07/22 0400

## 2022-05-27 ENCOUNTER — Ambulatory Visit
Admission: RE | Admit: 2022-05-27 | Discharge: 2022-05-27 | Disposition: A | Discharge status: Home / Self Care | Payer: 59 | Source: Ambulatory Visit | Attending: Family Medicine | Admitting: Family Medicine

## 2022-05-27 DIAGNOSIS — Z0000 Encounter for general adult medical examination without abnormal findings: Secondary | ICD-10-CM

## 2022-05-27 DIAGNOSIS — Z780 Asymptomatic menopausal state: Secondary | ICD-10-CM | POA: Insufficient documentation

## 2022-05-27 DIAGNOSIS — M8589 Other specified disorders of bone density and structure, multiple sites: Secondary | ICD-10-CM | POA: Diagnosis not present

## 2022-05-27 DIAGNOSIS — Z1231 Encounter for screening mammogram for malignant neoplasm of breast: Secondary | ICD-10-CM | POA: Diagnosis not present

## 2022-05-30 ENCOUNTER — Other Ambulatory Visit: Payer: Self-pay | Admitting: Family Medicine

## 2022-05-30 DIAGNOSIS — R921 Mammographic calcification found on diagnostic imaging of breast: Secondary | ICD-10-CM

## 2022-05-30 DIAGNOSIS — R928 Other abnormal and inconclusive findings on diagnostic imaging of breast: Secondary | ICD-10-CM

## 2022-05-30 DIAGNOSIS — N63 Unspecified lump in unspecified breast: Secondary | ICD-10-CM

## 2022-06-04 ENCOUNTER — Encounter: Payer: Self-pay | Admitting: Family Medicine

## 2022-06-04 ENCOUNTER — Ambulatory Visit: Payer: Self-pay | Admitting: *Deleted

## 2022-06-04 DIAGNOSIS — M8580 Other specified disorders of bone density and structure, unspecified site: Secondary | ICD-10-CM | POA: Insufficient documentation

## 2022-06-04 NOTE — Telephone Encounter (Signed)
Requesting mammogram results. No review of mammogram results from PCP at this time. Please advise and patient requesting a call back.

## 2022-06-04 NOTE — Telephone Encounter (Signed)
Please advice  

## 2022-06-04 NOTE — Telephone Encounter (Signed)
Pt notified and verbalized understanding.

## 2022-06-16 ENCOUNTER — Other Ambulatory Visit: Payer: Self-pay | Admitting: Family Medicine

## 2022-06-16 DIAGNOSIS — E782 Mixed hyperlipidemia: Secondary | ICD-10-CM

## 2022-06-16 DIAGNOSIS — Z5181 Encounter for therapeutic drug level monitoring: Secondary | ICD-10-CM

## 2022-06-16 DIAGNOSIS — I700 Atherosclerosis of aorta: Secondary | ICD-10-CM

## 2022-06-18 ENCOUNTER — Ambulatory Visit: Payer: Self-pay

## 2022-06-18 NOTE — Telephone Encounter (Signed)
Summary: covid +   Pt states she is covid + and requesting recommendations on the next steps of care   Please advise      Chief Complaint: muscle aches Symptoms: muscle and bone aches, nasal congestion, no change in cough (smoker) Frequency: today is day 1  Pertinent Negatives: Patient denies SOB, fever, chills, fatigue, loss of smell and taste,sore throat Disposition: '[]'$ ED /'[]'$ Urgent Care (no appt availability in office) / '[x]'$ Appointment(In office/virtual)/ '[]'$  [facility]/ '[]'$ Home Care/ '[]'$ Refused Recommended Disposition /'[]'$  Mobile Bus/ '[]'$  Follow-up with PCP Additional Notes: high risk would like Paxlovid  Reason for Disposition  [1] HIGH RISK patient (e.g., weak immune system, age > 36 years, obesity with BMI 30 or higher, pregnant, chronic lung disease or other chronic medical condition) AND [2] COVID symptoms (e.g., cough, fever)  (Exceptions: Already seen by PCP and no new or worsening symptoms.)  Answer Assessment - Initial Assessment Questions 1. COVID-19 DIAGNOSIS: "How do you know that you have COVID?" (e.g., positive lab test or self-test, diagnosed by doctor or NP/PA, symptoms after exposure).     Self test 2. COVID-19 EXPOSURE: "Was there any known exposure to COVID before the symptoms began?" CDC Definition of close contact: within 6 feet (2 meters) for a total of 15 minutes or more over a 24-hour period.      son 3. ONSET: "When did the COVID-19 symptoms start?"      Yesterday  4. WORST SYMPTOM: "What is your worst symptom?" (e.g., cough, fever, shortness of breath, muscle aches)     Muscle aches 5. COUGH: "Do you have a cough?" If Yes, ask: "How bad is the cough?"       Chronic cough no worse 6. FEVER: "Do you have a fever?" If Yes, ask: "What is your temperature, how was it measured, and when did it start?"     no 7. RESPIRATORY STATUS: "Describe your breathing?" (e.g., normal; shortness of breath, wheezing, unable to speak)      normal 8.  BETTER-SAME-WORSE: "Are you getting better, staying the same or getting worse compared to yesterday?"  If getting worse, ask, "In what way?"     better 9. OTHER SYMPTOMS: "Do you have any other symptoms?"  (e.g., chills, fatigue, headache, loss of smell or taste, muscle pain, sore throat)     Muscle sore bones aches, nasal congestion 10. HIGH RISK DISEASE: "Do you have any chronic medical problems?" (e.g., asthma, heart or lung disease, weak immune system, obesity, etc.)       COPD, HTN, 11. VACCINE: "Have you had the COVID-19 vaccine?" If Yes, ask: "Which one, how many shots, when did you get it?"       no 12. PREGNANCY: "Is there any chance you are pregnant?" "When was your last menstrual period?"       N/a 13. O2 SATURATION MONITOR:  "Do you use an oxygen saturation monitor (pulse oximeter) at home?" If Yes, ask "What is your reading (oxygen level) today?" "What is your usual oxygen saturation reading?" (e.g., 95%)       Yes- hasn't checked it - lost -90-95%  Protocols used: Coronavirus (COVID-19) Diagnosed or Suspected-A-AH

## 2022-06-19 ENCOUNTER — Encounter: Payer: Self-pay | Admitting: Family Medicine

## 2022-06-19 ENCOUNTER — Telehealth (INDEPENDENT_AMBULATORY_CARE_PROVIDER_SITE_OTHER): Payer: 59 | Admitting: Family Medicine

## 2022-06-19 DIAGNOSIS — J069 Acute upper respiratory infection, unspecified: Secondary | ICD-10-CM

## 2022-06-19 DIAGNOSIS — U071 COVID-19: Secondary | ICD-10-CM

## 2022-06-19 DIAGNOSIS — J449 Chronic obstructive pulmonary disease, unspecified: Secondary | ICD-10-CM | POA: Diagnosis not present

## 2022-06-19 MED ORDER — ALBUTEROL SULFATE HFA 108 (90 BASE) MCG/ACT IN AERS: 2.0000 | INHALATION_SPRAY | Freq: Four times a day (QID) | RESPIRATORY_TRACT | 5 refills | Status: DC | PRN

## 2022-06-19 MED ORDER — PREDNISONE 20 MG PO TABS: 40.0000 mg | ORAL_TABLET | Freq: Every day | ORAL | 0 refills | Status: AC

## 2022-06-19 MED ORDER — NIRMATRELVIR/RITONAVIR (PAXLOVID)TABLET: 3.0000 | ORAL_TABLET | Freq: Two times a day (BID) | ORAL | 0 refills | Status: AC

## 2022-06-19 NOTE — Progress Notes (Signed)
Name: Savannah Washington   MRN: [id number]    DOB: May 07, 1961   Date:06/19/2022       Progress Note  Subjective:    Chief Complaint  Chief Complaint  Patient presents with   Covid Positive    Monday 06/17/2022  home test, no vital signs   Nasal Congestion   Cough   Headache    I connected with  Dorthula Perfect  on 06/19/22 at 11:00 AM EDT by a video enabled telemedicine application and verified that I am speaking with the correct person using two identifiers.  I discussed the limitations of evaluation and management by telemedicine and the availability of in person appointments. The patient expressed understanding and agreed to proceed. Staff also discussed with the patient that there may be a patient responsible charge related to this service. Patient Location: home Provider Location: [facility] Additional Individuals present: none  HPI  Pt developed some sx on Monday - started having body aches after working hard out in the yard Positive home test  x2 She continues to have some generalized body aches, headache, cough and congestion, clear nasal discharge and clear sputum, she has been using her inhaler more but she denies any chest pain, fever, dyspnea, sweats She does have a pulse ox at home her oxygen has been 97-98% and HR 77 She has not had COVID before, no COVID vaccines done, history of COPD with a few exacerbations a year she states usually when she gets a cold, also well-controlled type 2 diabetes, hypertension, hyperlipidemia  Last renal function was reviewed       Patient Active Problem List   Diagnosis Date Noted   Osteopenia 06/04/2022   Current mild episode of major depressive disorder, unspecified whether recurrent ([place]) 02/12/2021   Type 2 diabetes mellitus with diabetic polyneuropathy, without long-term current use of insulin ([place]) 02/04/2020   Tobacco abuse 12/01/2018   Aortic atherosclerosis ([place]) 12/29/2017   Hepatic steatosis 12/29/2017   COPD  (chronic obstructive pulmonary disease) ([place]) 12/05/2017   Degenerative disc disease, cervical 09/04/2017   Cervical arthritis 09/04/2017   Muscle spasm of left shoulder area 09/03/2017   Elevated platelet count 04/11/2017   Marijuana use 03/17/2017   Prediabetes 03/14/2017   Essential hypertension, benign 03/14/2017   Hyperlipidemia 03/14/2017   Anxiety disorder 03/14/2017    Social History   Tobacco Use   Smoking status: Every Day    Packs/day: 0.50    Years: 44.00    Total pack years: 22.00    Types: Cigarettes    Start date: 08/30/2018   Smokeless tobacco: Never  Substance Use Topics   Alcohol use: Yes    Alcohol/week: 5.0 standard drinks of alcohol    Types: 1 Glasses of wine, 4 Cans of beer per week     Current Outpatient Medications:    albuterol (PROAIR HFA) 108 (90 Base) MCG/ACT inhaler, Inhale 2 puffs into the lungs every 6 (six) hours as needed for wheezing or shortness of breath., Disp: 1 each, Rfl: 5   atorvastatin (LIPITOR) 80 MG tablet, Take 1 tablet (80 mg total) by mouth at bedtime., Disp: 90 tablet, Rfl: 3   buPROPion (WELLBUTRIN XL) 300 MG 24 hr tablet, Take 1 tablet by mouth once daily, Disp: 90 tablet, Rfl: 1   busPIRone (BUSPAR) 15 MG tablet, Take 1 tablet (15 mg total) by mouth 2 (two) times daily., Disp: 180 tablet, Rfl: 3   cetirizine (ZYRTEC) 10 MG tablet, Take 10 mg by mouth daily.,  Disp: , Rfl:    Fluticasone-Umeclidin-Vilant (TRELEGY ELLIPTA) 100-62.5-25 MCG/INH AEPB, Inhale 1 each into the lungs daily., Disp: 1 each, Rfl: 12   ibuprofen (ADVIL) 600 MG tablet, TAKE 1 TABLET BY MOUTH THREE TIMES DAILY, Disp: 30 tablet, Rfl: 0   icosapent Ethyl (VASCEPA) 1 g capsule, Take 2 capsules by mouth twice daily, Disp: 90 capsule, Rfl: 0   ipratropium-albuterol (DUONEB) 0.5-2.5 (3) MG/3ML SOLN, Take 3 mLs by nebulization every 8 (eight) hours as needed., Disp: 180 mL, Rfl: 1   lisinopril (ZESTRIL) 5 MG tablet, Take 1 tablet by mouth once daily, Disp: 90 tablet,  Rfl: 1   meloxicam (MOBIC) 15 MG tablet, Take 1 tablet by mouth once daily, Disp: 90 tablet, Rfl: 3   metFORMIN (GLUCOPHAGE) 1000 MG tablet, Take 1 tablet (1,000 mg total) by mouth 2 (two) times daily with a meal., Disp: 180 tablet, Rfl: 0   methocarbamol (ROBAXIN) 500 MG tablet, Take 1 tablet by mouth three times daily as needed for muscle spasm, Disp: 90 tablet, Rfl: 0   montelukast (SINGULAIR) 10 MG tablet, Take 1 tablet (10 mg total) by mouth at bedtime., Disp: 30 tablet, Rfl: 3   Multiple Vitamins-Minerals (MULTI COMPLETE PO), Take 1,000 Units by mouth daily., Disp: , Rfl:    nystatin (MYCOSTATIN/NYSTOP) powder, APPLY  POWDER TOPICALLY TO AFFECTED AREA THREE TIMES DAILY AS NEEDED, Disp: 60 g, Rfl: 0   pregabalin (LYRICA) 25 MG capsule, TAKE 1 CAPSULE BY MOUTH TWICE DAILY FOR  CHRONIC  NECK  PAIN  OR  MSK  PAIN, Disp: 180 capsule, Rfl: 0   Turmeric 500 MG CAPS, Take 500 mg by mouth daily., Disp: , Rfl:   No Known Allergies  I personally reviewed active problem list, medication list, allergies, family history, social history, health maintenance, notes from last encounter, lab results, imaging with the patient/caregiver today.   Review of Systems  Constitutional: Negative.   HENT: Negative.    Eyes: Negative.   Respiratory: Negative.    Cardiovascular: Negative.   Gastrointestinal: Negative.   Endocrine: Negative.   Genitourinary: Negative.   Musculoskeletal: Negative.   Skin: Negative.   Allergic/Immunologic: Negative.   Neurological: Negative.   Hematological: Negative.   Psychiatric/Behavioral: Negative.    All other systems reviewed and are negative.     Objective:   Virtual encounter, vitals limited, only able to obtain the following Today's Vitals   06/19/22 0915  SpO2: 97%   There is no height or weight on file to calculate BMI. Nursing Note and Vital Signs reviewed.  Physical Exam Vitals and nursing note reviewed.  Constitutional:      General: She is not in  acute distress.    Appearance: She is well-developed. She is not ill-appearing, toxic-appearing or diaphoretic.  Pulmonary:     Effort: Pulmonary effort is normal. No respiratory distress.     Comments: Intermittent coughing, able to speak in full and complete sentences with normal phonation, no observed retractions, dyspnea, tachypnea, no audible wheeze or stridor Neurological:     Mental Status: She is alert.     PE limited by virtual encounter  No results found for this or any previous visit (from the past 72 hour(s)).  Assessment and Plan:     ICD-10-CM   1. Upper respiratory tract infection due to COVID-19 virus  U07.1 nirmatrelvir/ritonavir EUA (PAXLOVID) 20 x 150 MG & 10 x '100MG'$  TABS   J06.9     2. Chronic obstructive pulmonary disease, unspecified COPD type ([place])  J44.9 albuterol (PROAIR HFA) 108 (90 Base) MCG/ACT inhaler    predniSONE (DELTASONE) 20 MG tablet   stable sx, need to help get rescue inhaler for her    Reviewed isolation for COVID, she is on day 2 of symptoms should isolate and mask through day 5 Return to work would be determined on day 6 after discussion of her symptoms I explained to her that if her respiratory symptoms are mild or improved and she is afebrile she can return to work after that but does need to wear a mask for another 5 days She will need to contact us if she needs a work note I offered 1 today but she declined Encouraged her to do supportive and symptomatic treatment including rest, pushing fluids, using over-the-counter cough medications, decongestants, Tylenol or ibuprofen, for her COPD exacerbation which is currently very mild appearing, we will start a steroid burst, she can use nebulizers or inhalers, encouraged her to use Delsym or Robitussin over-the-counter and start Mucinex  ER precautions were reviewed at length and patient verbalized understanding She had no increased work of breathing or respiratory distress today, she has normal  oxygenation and heart rate and I do feel she is safe to initiate outpatient treatment of COVID with Paxlovid and treatment of COPD exacerbation I did encourage her to follow-up if she is having any worsening or if she is not improving in 1-2 weeks   - I discussed the assessment and treatment plan with the patient. The patient was provided an opportunity to ask questions and all were answered. The patient agreed with the plan and demonstrated an understanding of the instructions.  I provided 22+ minutes of non-face-to-face time during this encounter.  [name], PA-C 06/19/22 11:20 AM

## 2022-06-21 ENCOUNTER — Other Ambulatory Visit: Payer: 59

## 2022-06-21 ENCOUNTER — Inpatient Hospital Stay: Admission: RE | Admit: 2022-06-21 | Payer: 59 | Source: Ambulatory Visit

## 2022-07-08 ENCOUNTER — Other Ambulatory Visit: Payer: Self-pay | Admitting: Family Medicine

## 2022-07-08 ENCOUNTER — Ambulatory Visit
Admission: RE | Admit: 2022-07-08 | Discharge: 2022-07-08 | Disposition: A | Discharge status: Home / Self Care | Payer: 59 | Source: Ambulatory Visit | Attending: Family Medicine | Admitting: Family Medicine

## 2022-07-08 DIAGNOSIS — R921 Mammographic calcification found on diagnostic imaging of breast: Secondary | ICD-10-CM | POA: Diagnosis not present

## 2022-07-08 DIAGNOSIS — R928 Other abnormal and inconclusive findings on diagnostic imaging of breast: Secondary | ICD-10-CM

## 2022-07-08 DIAGNOSIS — N63 Unspecified lump in unspecified breast: Secondary | ICD-10-CM | POA: Insufficient documentation

## 2022-07-17 ENCOUNTER — Other Ambulatory Visit: Payer: Self-pay | Admitting: Acute Care

## 2022-07-17 DIAGNOSIS — Z87891 Personal history of nicotine dependence: Secondary | ICD-10-CM

## 2022-07-17 DIAGNOSIS — R911 Solitary pulmonary nodule: Secondary | ICD-10-CM

## 2022-07-18 ENCOUNTER — Ambulatory Visit
Admission: RE | Admit: 2022-07-18 | Discharge: 2022-07-18 | Disposition: A | Discharge status: Home / Self Care | Payer: 59 | Source: Ambulatory Visit | Attending: Family Medicine | Admitting: Family Medicine

## 2022-07-18 DIAGNOSIS — R928 Other abnormal and inconclusive findings on diagnostic imaging of breast: Secondary | ICD-10-CM | POA: Diagnosis not present

## 2022-07-18 DIAGNOSIS — R921 Mammographic calcification found on diagnostic imaging of breast: Secondary | ICD-10-CM | POA: Insufficient documentation

## 2022-07-18 DIAGNOSIS — N6012 Diffuse cystic mastopathy of left breast: Secondary | ICD-10-CM | POA: Diagnosis not present

## 2022-07-18 HISTORY — PX: BREAST BIOPSY: SHX20

## 2022-07-19 LAB — SURGICAL PATHOLOGY

## 2022-07-21 DIAGNOSIS — M1990 Unspecified osteoarthritis, unspecified site: Secondary | ICD-10-CM | POA: Diagnosis not present

## 2022-07-21 DIAGNOSIS — Z720 Tobacco use: Secondary | ICD-10-CM | POA: Diagnosis not present

## 2022-07-21 DIAGNOSIS — B372 Candidiasis of skin and nail: Secondary | ICD-10-CM | POA: Diagnosis not present

## 2022-07-21 DIAGNOSIS — J302 Other seasonal allergic rhinitis: Secondary | ICD-10-CM | POA: Diagnosis not present

## 2022-07-21 DIAGNOSIS — Z604 Social exclusion and rejection: Secondary | ICD-10-CM | POA: Diagnosis not present

## 2022-07-21 DIAGNOSIS — I110 Hypertensive heart disease with heart failure: Secondary | ICD-10-CM | POA: Diagnosis not present

## 2022-07-21 DIAGNOSIS — I509 Heart failure, unspecified: Secondary | ICD-10-CM | POA: Diagnosis not present

## 2022-07-21 DIAGNOSIS — E785 Hyperlipidemia, unspecified: Secondary | ICD-10-CM | POA: Diagnosis not present

## 2022-07-21 DIAGNOSIS — R69 Illness, unspecified: Secondary | ICD-10-CM | POA: Diagnosis not present

## 2022-07-21 DIAGNOSIS — E119 Type 2 diabetes mellitus without complications: Secondary | ICD-10-CM | POA: Diagnosis not present

## 2022-07-21 DIAGNOSIS — J449 Chronic obstructive pulmonary disease, unspecified: Secondary | ICD-10-CM | POA: Diagnosis not present

## 2022-07-21 DIAGNOSIS — M791 Myalgia, unspecified site: Secondary | ICD-10-CM | POA: Diagnosis not present

## 2022-08-01 ENCOUNTER — Other Ambulatory Visit: Payer: Self-pay | Admitting: Physician Assistant

## 2022-08-01 DIAGNOSIS — B372 Candidiasis of skin and nail: Secondary | ICD-10-CM

## 2022-08-01 NOTE — Telephone Encounter (Signed)
Requested medication (s) are due for refill today: yes  Requested medication (s) are on the active medication list: yes  Last refill:  10/29/21  Future visit scheduled: yes  Notes to clinic:  Medication not assigned to a protocol, review manually.     Requested Prescriptions  Pending Prescriptions Disp Refills   nystatin (MYCOSTATIN/NYSTOP) powder [Pharmacy Med Name: Nystatin 100000 UNIT/GM External Powder] 60 g 0    Sig: APPLY  POWDER TOPICALLY THREE TIMES DAILY TO AFFECTED AREA AS NEEDED     Off-Protocol Failed - 08/01/2022 12:24 PM      Failed - Medication not assigned to a protocol, review manually.      Passed - Valid encounter within last 12 months    Recent Outpatient Visits           1 month ago Upper respiratory tract infection due to COVID-19 virus   [facility] [name], PA-C   5 months ago Adult general medical exam   [facility] [name], PA-C   1 year ago Type 2 diabetes mellitus with diabetic polyneuropathy, without long-term current use of insulin [facility])   [facility] [name], PA-C   2 years ago Type 2 diabetes mellitus with diabetic polyneuropathy, without long-term current use of insulin [facility])   [facility] [name], PA-C   2 years ago Degenerative disc disease, cervical   [facility] [name], [place]

## 2022-08-08 ENCOUNTER — Other Ambulatory Visit: Payer: Self-pay | Admitting: Family Medicine

## 2022-08-08 DIAGNOSIS — Z5181 Encounter for therapeutic drug level monitoring: Secondary | ICD-10-CM

## 2022-08-08 DIAGNOSIS — I700 Atherosclerosis of aorta: Secondary | ICD-10-CM

## 2022-08-08 DIAGNOSIS — E782 Mixed hyperlipidemia: Secondary | ICD-10-CM

## 2022-08-08 NOTE — Telephone Encounter (Signed)
Requested medications are due for refill today.  yes  Requested medications are on the active medications list.  yes  Last refill. 9/18/20203 #90 0 rf  Future visit scheduled.   no  Notes to clinic.  Medication not assigned to a protocol - please review for refill.    Requested Prescriptions  Pending Prescriptions Disp Refills   icosapent Ethyl (VASCEPA) 1 g capsule [Pharmacy Med Name: Icosapent Ethyl 1 GM Oral Capsule] 90 capsule 0    Sig: Take 2 capsules by mouth twice daily     Off-Protocol Failed - 08/08/2022  4:11 PM      Failed - Medication not assigned to a protocol, review manually.      Passed - Valid encounter within last 12 months    Recent Outpatient Visits           1 month ago Upper respiratory tract infection due to COVID-19 virus   [facility] [name], PA-C   5 months ago Adult general medical exam   [facility] [name], PA-C   1 year ago Type 2 diabetes mellitus with diabetic polyneuropathy, without long-term current use of insulin [facility])   [facility] [name], PA-C   2 years ago Type 2 diabetes mellitus with diabetic polyneuropathy, without long-term current use of insulin [facility])   [facility] [name], PA-C   2 years ago Degenerative disc disease, cervical   [facility] [name], [place]

## 2022-09-14 ENCOUNTER — Other Ambulatory Visit: Payer: Self-pay | Admitting: Family Medicine

## 2022-09-14 DIAGNOSIS — I700 Atherosclerosis of aorta: Secondary | ICD-10-CM

## 2022-09-14 DIAGNOSIS — E782 Mixed hyperlipidemia: Secondary | ICD-10-CM

## 2022-09-14 DIAGNOSIS — Z5181 Encounter for therapeutic drug level monitoring: Secondary | ICD-10-CM

## 2022-10-11 ENCOUNTER — Other Ambulatory Visit: Payer: Self-pay | Admitting: Family Medicine

## 2022-10-11 DIAGNOSIS — E782 Mixed hyperlipidemia: Secondary | ICD-10-CM

## 2022-10-11 DIAGNOSIS — Z5181 Encounter for therapeutic drug level monitoring: Secondary | ICD-10-CM

## 2022-10-11 DIAGNOSIS — I700 Atherosclerosis of aorta: Secondary | ICD-10-CM

## 2022-10-25 ENCOUNTER — Inpatient Hospital Stay: Admission: RE | Admit: 2022-10-25 | Payer: 59 | Source: Ambulatory Visit

## 2022-11-03 ENCOUNTER — Other Ambulatory Visit: Payer: Self-pay | Admitting: Family Medicine

## 2022-11-03 DIAGNOSIS — Z5181 Encounter for therapeutic drug level monitoring: Secondary | ICD-10-CM

## 2022-11-03 DIAGNOSIS — E782 Mixed hyperlipidemia: Secondary | ICD-10-CM

## 2022-11-03 DIAGNOSIS — I700 Atherosclerosis of aorta: Secondary | ICD-10-CM

## 2022-11-08 ENCOUNTER — Ambulatory Visit
Admission: RE | Admit: 2022-11-08 | Discharge: 2022-11-08 | Disposition: A | Discharge status: Home / Self Care | Payer: 59 | Source: Ambulatory Visit | Attending: Acute Care | Admitting: Acute Care

## 2022-11-08 DIAGNOSIS — R911 Solitary pulmonary nodule: Secondary | ICD-10-CM

## 2022-11-08 DIAGNOSIS — I700 Atherosclerosis of aorta: Secondary | ICD-10-CM | POA: Diagnosis not present

## 2022-11-08 DIAGNOSIS — J439 Emphysema, unspecified: Secondary | ICD-10-CM | POA: Diagnosis not present

## 2022-11-08 DIAGNOSIS — Z87891 Personal history of nicotine dependence: Secondary | ICD-10-CM

## 2022-11-11 ENCOUNTER — Other Ambulatory Visit: Payer: Self-pay

## 2022-11-11 DIAGNOSIS — Z87891 Personal history of nicotine dependence: Secondary | ICD-10-CM

## 2022-11-11 DIAGNOSIS — F17210 Nicotine dependence, cigarettes, uncomplicated: Secondary | ICD-10-CM

## 2022-11-29 DIAGNOSIS — E119 Type 2 diabetes mellitus without complications: Secondary | ICD-10-CM | POA: Diagnosis not present

## 2022-11-29 DIAGNOSIS — H2513 Age-related nuclear cataract, bilateral: Secondary | ICD-10-CM | POA: Diagnosis not present

## 2022-11-29 LAB — HM DIABETES EYE EXAM

## 2022-12-10 DIAGNOSIS — R0602 Shortness of breath: Secondary | ICD-10-CM | POA: Diagnosis not present

## 2022-12-10 DIAGNOSIS — S43402A Unspecified sprain of left shoulder joint, initial encounter: Secondary | ICD-10-CM | POA: Diagnosis not present

## 2022-12-10 DIAGNOSIS — R079 Chest pain, unspecified: Secondary | ICD-10-CM | POA: Diagnosis not present

## 2022-12-10 DIAGNOSIS — R9431 Abnormal electrocardiogram [ECG] [EKG]: Secondary | ICD-10-CM | POA: Diagnosis not present

## 2022-12-10 DIAGNOSIS — M19012 Primary osteoarthritis, left shoulder: Secondary | ICD-10-CM | POA: Diagnosis not present

## 2022-12-10 DIAGNOSIS — X58XXXA Exposure to other specified factors, initial encounter: Secondary | ICD-10-CM | POA: Diagnosis not present

## 2022-12-10 DIAGNOSIS — M25512 Pain in left shoulder: Secondary | ICD-10-CM | POA: Diagnosis not present

## 2022-12-10 DIAGNOSIS — R0789 Other chest pain: Secondary | ICD-10-CM | POA: Diagnosis not present

## 2022-12-11 DIAGNOSIS — M25512 Pain in left shoulder: Secondary | ICD-10-CM | POA: Diagnosis not present

## 2022-12-24 ENCOUNTER — Ambulatory Visit (INDEPENDENT_AMBULATORY_CARE_PROVIDER_SITE_OTHER): Payer: 59 | Admitting: Family Medicine

## 2022-12-24 ENCOUNTER — Ambulatory Visit: Payer: Self-pay

## 2022-12-24 ENCOUNTER — Encounter: Payer: Self-pay | Admitting: Family Medicine

## 2022-12-24 VITALS — BP 164/96 | HR 100 | Temp 97.6°F | Resp 16 | Ht 61.0 in | Wt 131.0 lb

## 2022-12-24 DIAGNOSIS — M25512 Pain in left shoulder: Secondary | ICD-10-CM | POA: Diagnosis not present

## 2022-12-24 DIAGNOSIS — J449 Chronic obstructive pulmonary disease, unspecified: Secondary | ICD-10-CM | POA: Diagnosis not present

## 2022-12-24 DIAGNOSIS — F320 Major depressive disorder, single episode, mild: Secondary | ICD-10-CM | POA: Diagnosis not present

## 2022-12-24 DIAGNOSIS — Z5181 Encounter for therapeutic drug level monitoring: Secondary | ICD-10-CM

## 2022-12-24 DIAGNOSIS — R7989 Other specified abnormal findings of blood chemistry: Secondary | ICD-10-CM

## 2022-12-24 DIAGNOSIS — M62838 Other muscle spasm: Secondary | ICD-10-CM | POA: Diagnosis not present

## 2022-12-24 DIAGNOSIS — M542 Cervicalgia: Secondary | ICD-10-CM | POA: Diagnosis not present

## 2022-12-24 DIAGNOSIS — M47812 Spondylosis without myelopathy or radiculopathy, cervical region: Secondary | ICD-10-CM | POA: Diagnosis not present

## 2022-12-24 DIAGNOSIS — I700 Atherosclerosis of aorta: Secondary | ICD-10-CM | POA: Diagnosis not present

## 2022-12-24 DIAGNOSIS — E782 Mixed hyperlipidemia: Secondary | ICD-10-CM

## 2022-12-24 DIAGNOSIS — E1142 Type 2 diabetes mellitus with diabetic polyneuropathy: Secondary | ICD-10-CM | POA: Diagnosis not present

## 2022-12-24 DIAGNOSIS — M546 Pain in thoracic spine: Secondary | ICD-10-CM

## 2022-12-24 DIAGNOSIS — M5410 Radiculopathy, site unspecified: Secondary | ICD-10-CM | POA: Diagnosis not present

## 2022-12-24 DIAGNOSIS — I10 Essential (primary) hypertension: Secondary | ICD-10-CM

## 2022-12-24 MED ORDER — PANTOPRAZOLE SODIUM 40 MG PO TBEC: 40.0000 mg | DELAYED_RELEASE_TABLET | Freq: Every day | ORAL | 3 refills | Status: DC

## 2022-12-24 MED ORDER — PREDNISONE 20 MG PO TABS: 40.0000 mg | ORAL_TABLET | Freq: Every day | ORAL | 0 refills | Status: AC

## 2022-12-24 MED ORDER — PREGABALIN 25 MG PO CAPS: 25.0000 mg | ORAL_CAPSULE | Freq: Two times a day (BID) | ORAL | 0 refills | Status: DC

## 2022-12-24 MED ORDER — BACLOFEN 5 MG PO TABS: 5.0000 mg | ORAL_TABLET | Freq: Three times a day (TID) | ORAL | 1 refills | Status: DC | PRN

## 2022-12-24 MED ORDER — KETOROLAC TROMETHAMINE 60 MG/2ML IM SOLN
60.0000 mg | Freq: Once | INTRAMUSCULAR | Status: AC
  Administered 2022-12-24: 30 mg via INTRAMUSCULAR

## 2022-12-24 MED ORDER — CELECOXIB 100 MG PO CAPS: 100.0000 mg | ORAL_CAPSULE | Freq: Two times a day (BID) | ORAL | 0 refills | Status: DC

## 2022-12-24 NOTE — Telephone Encounter (Signed)
  Chief Complaint: Left upper back and arm pain, weakness Symptoms: above Frequency: 2 weeks Pertinent Negatives: Patient denies Chest pain Disposition: [] ED /[] Urgent Care (no appt availability in office) / [x] Appointment(In office/virtual)/ []  [place] [facility]/ [] Home Care/ [] Refused Recommended Disposition /[] [place] [facility]/ []  Follow-up with PCP Additional Notes: PT was seen in ED for same issue to r/o heart problem on 12/10/2022. PT states that pain & weakness continue. Pt was given pain medication at ED visit which has been ineffective at controlling pain. PT originally thought it was a rotator cuff issue.   Reason for Disposition  [1] SEVERE back pain (e.g., excruciating, unable to do any normal activities) AND [2] not improved 2 hours after pain medicine  Answer Assessment - Initial Assessment Questions 1. ONSET: "When did the pain begin?"      2 weeks 2. LOCATION: "Where does it hurt?" (upper, mid or lower back)     Upper back - under shoulder blade 3. SEVERITY: "How bad is the pain?"  (e.g., Scale 1-10; mild, moderate, or severe)   - MILD (1-3): Doesn't interfere with normal activities.    - MODERATE (4-7): Interferes with normal activities or awakens from sleep.    - SEVERE (8-10): Excruciating pain, unable to do any normal activities.      10/10 4. PATTERN: "Is the pain constant?" (e.g., yes, no; constant, intermittent)      constant 5. RADIATION: "Does the pain shoot into your legs or somewhere else?"     Shoots into arm 6. CAUSE:  "What do you think is causing the back pain?"      Rotator cuff - Pinched nerve 7. BACK OVERUSE:  "Any recent lifting of heavy objects, strenuous work or exercise?"     Yes - had picked up boxes 8. MEDICINES: "What have you taken so far for the pain?" (e.g., nothing, acetaminophen, NSAIDS)      9. NEUROLOGIC SYMPTOMS: "Do you have any weakness, numbness, or problems with bowel/bladder control?"     Numbness in arm - and  weakness 10. OTHER SYMPTOMS: "Do you have any other symptoms?" (e.g., fever, abdomen pain, burning with urination, blood in urine)       no  Protocols used: Back Pain-A-AH

## 2022-12-24 NOTE — Progress Notes (Signed)
Patient ID: Savannah Washington, female    DOB: 10/09/1960, 62 y.o.   MRN: [id number]  PCP: [name], PA-C  Chief Complaint  Patient presents with   Arm Pain        Back Pain    Upper back left side, onset for several weeks    Subjective:   Savannah Washington is a 62 y.o. female, presents to clinic with CC of the following:  HPI  Neck left shoulder pain and pain in mid thoracic back between spine and shoulder blade on left Pain flared up this month after only pulling on a box, it calmed down for a few day then flared up severely again, pain was so severe she went to the ER for eval  [facility] ED visit -  12/10/2022 3:45 PM EDT  EXAM: XR SHOULDER 3 OR MORE VIEWS LEFT DATE: 12/10/2022 3:43 PM ACCESSION: [id number] UN DICTATED: 12/10/2022 3:44 PM INTERPRETATION LOCATION: [place]: 62 years old Female with shoulder pain     COMPARISON: None.  TECHNIQUE: AP, Grashey, outlet, and axillary views of the left shoulder.  FINDINGS:  No fractures or acute bony abnormalities.  Narrowing glenohumeral joint.  Osteoarthrosis acromioclavicular joint. Normal alignment  Visualized ribs and lung fields are normal.  She then went to ortho [facility] has seen her 2x and just did shoulder injection Friday (4 d ago, no improvement) She's taken and tried Tylenol otc She has mobic and was told to take no other NSAIDs Prior robaxin put on hold, ortho gave her some tizanidine - which she is almost out of and hasn't helped much Ortho appts $100 each (last week mon/tues and then Friday) Pain is in left shoulder and radiates to arm the most - constant and feels like its pulsating, she has limited left shoulder ROM, neck pain is less severe, it does pull if she flexes next but she has good extension and rotations, it feels like someone is stabbing her in the back.     Pt is very overdue for routine f/up She last was in office May 2023 for CPE and never did her  f/up for multiple chronic conditions Its unclear if she's gotten or is taking most of her meds (copd, DM, HTN, HLD, MDD)  She will get her needed labs today and then make another appt to go over labs and do her routine f/up    Patient Active Problem List   Diagnosis Date Noted   Osteopenia 06/04/2022   Current mild episode of major depressive disorder, unspecified whether recurrent ([place]) 02/12/2021   Type 2 diabetes mellitus with diabetic polyneuropathy, without long-term current use of insulin ([place]) 02/04/2020   Tobacco abuse 12/01/2018   Aortic atherosclerosis ([place]) 12/29/2017   Hepatic steatosis 12/29/2017   COPD (chronic obstructive pulmonary disease) ([place]) 12/05/2017   Degenerative disc disease, cervical 09/04/2017   Cervical arthritis 09/04/2017   Muscle spasm of left shoulder area 09/03/2017   Elevated platelet count 04/11/2017   Marijuana use 03/17/2017   Prediabetes 03/14/2017   Essential hypertension, benign 03/14/2017   Hyperlipidemia 03/14/2017   Anxiety disorder 03/14/2017      Current Outpatient Medications:    albuterol (PROAIR HFA) 108 (90 Base) MCG/ACT inhaler, Inhale 2 puffs into the lungs every 6 (six) hours as needed for wheezing or shortness of breath., Disp: 1 each, Rfl: 5   atorvastatin (LIPITOR) 80 MG tablet, TAKE 1 TABLET BY MOUTH AT BEDTIME, Disp: 90 tablet, Rfl: 1   buPROPion (  WELLBUTRIN XL) 300 MG 24 hr tablet, Take 1 tablet by mouth once daily, Disp: 90 tablet, Rfl: 1   busPIRone (BUSPAR) 15 MG tablet, Take 1 tablet (15 mg total) by mouth 2 (two) times daily., Disp: 180 tablet, Rfl: 3   cetirizine (ZYRTEC) 10 MG tablet, Take 10 mg by mouth daily., Disp: , Rfl:    Fluticasone-Umeclidin-Vilant (TRELEGY ELLIPTA) 100-62.5-25 MCG/INH AEPB, Inhale 1 each into the lungs daily., Disp: 1 each, Rfl: 12   ibuprofen (ADVIL) 600 MG tablet, TAKE 1 TABLET BY MOUTH THREE TIMES DAILY, Disp: 30 tablet, Rfl: 0   icosapent Ethyl (VASCEPA) 1 g capsule, Take 2 capsules by  mouth twice daily, Disp: 90 capsule, Rfl: 0   ipratropium-albuterol (DUONEB) 0.5-2.5 (3) MG/3ML SOLN, Take 3 mLs by nebulization every 8 (eight) hours as needed., Disp: 180 mL, Rfl: 1   lisinopril (ZESTRIL) 5 MG tablet, Take 1 tablet by mouth once daily, Disp: 90 tablet, Rfl: 1   meloxicam (MOBIC) 15 MG tablet, Take 1 tablet by mouth once daily, Disp: 90 tablet, Rfl: 3   metFORMIN (GLUCOPHAGE) 1000 MG tablet, Take 1 tablet (1,000 mg total) by mouth 2 (two) times daily with a meal., Disp: 180 tablet, Rfl: 0   methocarbamol (ROBAXIN) 500 MG tablet, Take 1 tablet by mouth three times daily as needed for muscle spasm, Disp: 90 tablet, Rfl: 0   montelukast (SINGULAIR) 10 MG tablet, Take 1 tablet (10 mg total) by mouth at bedtime., Disp: 30 tablet, Rfl: 3   Multiple Vitamins-Minerals (MULTI COMPLETE PO), Take 1,000 Units by mouth daily., Disp: , Rfl:    nystatin (MYCOSTATIN/NYSTOP) powder, APPLY  POWDER TOPICALLY THREE TIMES DAILY TO AFFECTED AREA AS NEEDED, Disp: 60 g, Rfl: 1   pregabalin (LYRICA) 25 MG capsule, TAKE 1 CAPSULE BY MOUTH TWICE DAILY FOR  CHRONIC  NECK  PAIN  OR  MSK  PAIN, Disp: 180 capsule, Rfl: 0   tiZANidine (ZANAFLEX) 4 MG tablet, Take 4 mg by mouth every 6 (six) hours., Disp: , Rfl:    Turmeric 500 MG CAPS, Take 500 mg by mouth daily., Disp: , Rfl:    No Known Allergies   Social History   Tobacco Use   Smoking status: Every Day    Packs/day: 0.50    Years: 44.00    Additional pack years: 0.00    Total pack years: 22.00    Types: Cigarettes    Start date: 08/30/2018   Smokeless tobacco: Never  Vaping Use   Vaping Use: Former  Substance Use Topics   Alcohol use: Yes    Alcohol/week: 5.0 standard drinks of alcohol    Types: 1 Glasses of wine, 4 Cans of beer per week   Drug use: No      Chart Review Today: I personally reviewed active problem list, medication list, allergies, family history, social history, health maintenance, notes from last encounter, lab results,  imaging with the patient/caregiver today.   Review of Systems  Constitutional: Negative.   HENT: Negative.    Eyes: Negative.   Respiratory: Negative.    Cardiovascular: Negative.   Gastrointestinal: Negative.   Endocrine: Negative.   Genitourinary: Negative.   Musculoskeletal: Negative.   Skin: Negative.   Allergic/Immunologic: Negative.   Neurological: Negative.   Hematological: Negative.   Psychiatric/Behavioral: Negative.    All other systems reviewed and are negative.      Objective:   Vitals:   12/24/22 1013 12/24/22 1125  BP: (!) 178/96 (!) 164/96  Pulse: 100  Resp: 16   Temp: 97.6 F (36.4 C)   TempSrc: Oral   SpO2: 94%   Weight: 131 lb (59.4 kg)   Height: 5\' 1"  (1.549 m)     Body mass index is 24.75 kg/m.  Physical Exam Vitals and nursing note reviewed.  Constitutional:      General: She is not in acute distress.    Appearance: She is well-developed. She is not ill-appearing, toxic-appearing or diaphoretic.  HENT:     Head: Normocephalic and atraumatic.     Nose: Nose normal.  Eyes:     General:        Right eye: No discharge.        Left eye: No discharge.     Conjunctiva/sclera: Conjunctivae normal.  Neck:     Trachea: Trachea and phonation normal. No tracheal deviation.  Cardiovascular:     Rate and Rhythm: Normal rate and regular rhythm.  Pulmonary:     Effort: Pulmonary effort is normal. No respiratory distress.     Breath sounds: No stridor.  Musculoskeletal:     Left shoulder: Tenderness present. Decreased range of motion. Normal strength. Normal pulse.     Left upper arm: No swelling or edema.     Cervical back: Normal range of motion. No erythema. Muscular tenderness present. No spinous process tenderness.     Thoracic back: Spasms and tenderness present. No swelling or bony tenderness. Normal range of motion.     Comments: Left mid back to left rhomboid area tender to palpation, no midline tenderness or bony tenderness  Skin:     General: Skin is warm and dry.     Findings: No rash.  Neurological:     Mental Status: She is alert.     Motor: No abnormal muscle tone.     Coordination: Coordination normal.  Psychiatric:        Behavior: Behavior normal.      Results for orders placed or performed in visit on 12/05/22  HM DIABETES EYE EXAM  Result Value Ref Range   HM Diabetic Eye Exam No Retinopathy No Retinopathy       Assessment & Plan:   1. Neck pain Acute on chronic - some radicular sx Prior NSAIDs and muscle relaxers not helpful -  Trial of different meds for neck/shoulder/back   - Baclofen 5 MG TABS; Take 1-2 tablets (5-10 mg total) by mouth 3 (three) times daily as needed (msk pain or spasms).  Dispense: 60 tablet; Refill: 1 - celecoxib (CELEBREX) 100 MG capsule; Take 1 capsule (100 mg total) by mouth 2 (two) times daily.  Dispense: 60 capsule; Refill: 0 - ketorolac (TORADOL) injection 60 mg - pregabalin (LYRICA) 25 MG capsule; Take 1 capsule (25 mg total) by mouth 2 (two) times daily. For neck or musculoskeletal pain  Dispense: 180 capsule; Refill: 0 - predniSONE (DELTASONE) 20 MG tablet; Take 2 tablets (40 mg total) by mouth daily with breakfast for 5 days.  Dispense: 10 tablet; Refill: 0  2. Acute pain of left shoulder Seeing ortho - but expensive and pt may not be able to go back due to cost Shoulder injection last Friday Encouraged pt to talk to them about the f/up plan, cost and PT there  - celecoxib (CELEBREX) 100 MG capsule; Take 1 capsule (100 mg total) by mouth 2 (two) times daily.  Dispense: 60 capsule; Refill: 0 - ketorolac (TORADOL) injection 60 mg - pregabalin (LYRICA) 25 MG capsule; Take 1 capsule (25 mg total) by mouth  2 (two) times daily. For neck or musculoskeletal pain  Dispense: 180 capsule; Refill: 0  3. Acute left-sided thoracic back pain Left rhomboid area - when I was examining her today with only little bit of massage to the area she had some pain relief - some PT and  massage may be very helpful (Again PT and other specialists limited by cost) - Baclofen 5 MG TABS; Take 1-2 tablets (5-10 mg total) by mouth 3 (three) times daily as needed (msk pain or spasms).  Dispense: 60 tablet; Refill: 1 - celecoxib (CELEBREX) 100 MG capsule; Take 1 capsule (100 mg total) by mouth 2 (two) times daily.  Dispense: 60 capsule; Refill: 0 - ketorolac (TORADOL) injection 60 mg - pregabalin (LYRICA) 25 MG capsule; Take 1 capsule (25 mg total) by mouth 2 (two) times daily. For neck or musculoskeletal pain  Dispense: 180 capsule; Refill: 0  4. Essential hypertension, benign Overdue for f/up - BP elevated today - labs today f/up appt in the next 2-4 weeks (same applies for all chronic health conditions below) - COMPLETE METABOLIC PANEL WITH GFR  5. Chronic obstructive pulmonary disease, unspecified COPD type ([place]) Pt denies sx or recent flare  6. Aortic atherosclerosis (H[facility])  - COMPLETE METABOLIC PANEL WITH GFR - Lipid panel  7. Type 2 diabetes mellitus with diabetic polyneuropathy, without long-term current use of insulin (H[facility]) Was previously controlled Lab Results  Component Value Date   HGBA1C 5.5 02/22/2022   - COMPLETE METABOLIC PANEL WITH GFR - Hemoglobin A1c - Lipid panel - Microalbumin / creatinine urine ratio  8. Muscle spasm of left shoulder area Trial of baclofen instead of robaxin or zanaflex - pt warned to not use muscle relaxers together - either or  9. Mixed hyperlipidemia Should be on statin - COMPLETE METABOLIC PANEL WITH GFR - Lipid panel  10. Current mild episode of major depressive disorder, unspecified whether recurrent ([place])    12/24/2022   10:12 AM 06/19/2022    9:13 AM 02/22/2022    9:00 AM  Depression screen PHQ 2/9  Decreased Interest 0 0 1  Down, Depressed, Hopeless 0 0 1  PHQ - 2 Score 0 0 2  Altered sleeping 0 0 1  Tired, decreased energy 0 0 1  Change in appetite 0 0 1  Feeling bad or failure about yourself  0 0 0  Trouble  concentrating 0 0 0  Moving slowly or fidgety/restless 0 0 0  Suicidal thoughts 0 0 0  PHQ-9 Score 0 0 5  Difficult doing work/chores Not difficult at all Not difficult at all Not difficult at all  On wellbutrin buspar  previously, phq neg today, reviewed   12. Cervical arthritis Acute on chronic - encouraged pt to get and try lyrica - previously prescribed - but never filled - pregabalin (LYRICA) 25 MG capsule; Take 1 capsule (25 mg total) by mouth 2 (two) times daily. For neck or musculoskeletal pain  Dispense: 180 capsule; Refill: 0  13. Encounter for medication monitoring  - CBC with Differential/Platelet - COMPLETE METABOLIC PANEL WITH GFR - Hemoglobin A1c - Lipid panel - Microalbumin / creatinine urine ratio  14. Elevated platelet count  - CBC with Differential/Platelet  15. Radiculopathy, unspecified spinal region Pain severe and radiating from shoulder and neck to upper back and down right arm - xrays from ED show glenohumeral joint space narrowing and AC joint OA - seeing ortho Steroid burst to decrease inflammation D/c all other NSAIDs and try celebrex instead, different muscle  relaxers, heat tx and PT strongly recommended  - celecoxib (CELEBREX) 100 MG capsule; Take 1 capsule (100 mg total) by mouth 2 (two) times daily.  Dispense: 60 capsule; Refill: 0 - ketorolac (TORADOL) injection 30 mg - pregabalin (LYRICA) 25 MG capsule; Take 1 capsule (25 mg total) by mouth 2 (two) times daily. For neck or musculoskeletal pain  Dispense: 180 capsule; Refill: 0 - predniSONE (DELTASONE) 20 MG tablet; Take 2 tablets (40 mg total) by mouth daily with breakfast for 5 days.  Dispense: 10 tablet; Refill: 0   Return for routine OV - overdue - schedule in the next 2-4 weeks please.    [name], PA-C 12/24/22 10:45 AM

## 2022-12-25 LAB — MICROALBUMIN / CREATININE URINE RATIO
Creatinine, Urine: 22 mg/dL (ref 20–275)
Microalb Creat Ratio: 64 mg/g creat — ABNORMAL HIGH (ref <30)
Microalb, Ur: 1.4 mg/dL

## 2022-12-25 LAB — LIPID PANEL
Cholesterol: 250 mg/dL — ABNORMAL HIGH (ref <200)
HDL: 85 mg/dL (ref >=50)
LDL Cholesterol (Calc): 126 mg/dL (calc) — ABNORMAL HIGH
Non-HDL Cholesterol (Calc): 165 mg/dL (calc) — ABNORMAL HIGH (ref <130)
Total CHOL/HDL Ratio: 2.9 (calc) (ref <5.0)
Triglycerides: 256 mg/dL — ABNORMAL HIGH (ref <150)

## 2022-12-25 LAB — CBC WITH DIFFERENTIAL/PLATELET
Absolute Monocytes: 917 cells/uL (ref 200–950)
Basophils Absolute: 56 cells/uL (ref 0–200)
Basophils Relative: 0.4 %
Eosinophils Absolute: 56 cells/uL (ref 15–500)
Eosinophils Relative: 0.4 %
HCT: 47.8 % — ABNORMAL HIGH (ref 35.0–45.0)
Hemoglobin: 16 g/dL — ABNORMAL HIGH (ref 11.7–15.5)
Lymphs Abs: 2425 cells/uL (ref 850–3900)
MCH: 30.8 pg (ref 27.0–33.0)
MCHC: 33.5 g/dL (ref 32.0–36.0)
MCV: 91.9 fL (ref 80.0–100.0)
MPV: 8.4 fL (ref 7.5–12.5)
Monocytes Relative: 6.5 %
Neutro Abs: 10646 cells/uL — ABNORMAL HIGH (ref 1500–7800)
Neutrophils Relative %: 75.5 %
Platelets: 520 10*3/uL — ABNORMAL HIGH (ref 140–400)
RBC: 5.2 10*6/uL — ABNORMAL HIGH (ref 3.80–5.10)
RDW: 12.6 % (ref 11.0–15.0)
Total Lymphocyte: 17.2 %
WBC: 14.1 10*3/uL — ABNORMAL HIGH (ref 3.8–10.8)

## 2022-12-25 LAB — COMPLETE METABOLIC PANEL WITH GFR
AG Ratio: 1.8 (calc) (ref 1.0–2.5)
ALT: 12 U/L (ref 6–29)
AST: 22 U/L (ref 10–35)
Albumin: 4.9 g/dL (ref 3.6–5.1)
Alkaline phosphatase (APISO): 62 U/L (ref 37–153)
BUN: 12 mg/dL (ref 7–25)
CO2: 28 mmol/L (ref 20–32)
Calcium: 9.9 mg/dL (ref 8.6–10.4)
Chloride: 98 mmol/L (ref 98–110)
Creat: 0.7 mg/dL (ref 0.50–1.05)
Globulin: 2.7 g/dL (calc) (ref 1.9–3.7)
Glucose, Bld: 100 mg/dL — ABNORMAL HIGH (ref 65–99)
Potassium: 4.7 mmol/L (ref 3.5–5.3)
Sodium: 137 mmol/L (ref 135–146)
Total Bilirubin: 0.6 mg/dL (ref 0.2–1.2)
Total Protein: 7.6 g/dL (ref 6.1–8.1)
eGFR: 98 mL/min/{1.73_m2} (ref >=60)

## 2022-12-25 LAB — HEMOGLOBIN A1C
Hgb A1c MFr Bld: 6.2 % of total Hgb — ABNORMAL HIGH (ref <5.7)
Mean Plasma Glucose: 131 mg/dL
eAG (mmol/L): 7.3 mmol/L

## 2023-01-01 ENCOUNTER — Other Ambulatory Visit: Payer: Self-pay | Admitting: Family Medicine

## 2023-01-01 DIAGNOSIS — F418 Other specified anxiety disorders: Secondary | ICD-10-CM

## 2023-01-01 DIAGNOSIS — Z87891 Personal history of nicotine dependence: Secondary | ICD-10-CM

## 2023-01-01 DIAGNOSIS — I10 Essential (primary) hypertension: Secondary | ICD-10-CM

## 2023-01-03 DIAGNOSIS — M40292 Other kyphosis, cervical region: Secondary | ICD-10-CM | POA: Diagnosis not present

## 2023-01-03 DIAGNOSIS — M9902 Segmental and somatic dysfunction of thoracic region: Secondary | ICD-10-CM | POA: Diagnosis not present

## 2023-01-03 DIAGNOSIS — M9901 Segmental and somatic dysfunction of cervical region: Secondary | ICD-10-CM | POA: Diagnosis not present

## 2023-01-03 DIAGNOSIS — M546 Pain in thoracic spine: Secondary | ICD-10-CM | POA: Diagnosis not present

## 2023-01-13 ENCOUNTER — Ambulatory Visit: Payer: 59 | Admitting: Family Medicine

## 2023-01-13 NOTE — Progress Notes (Deleted)
Name: Savannah Washington   MRN: [id number]    DOB: 13-Jan-1961   Date:01/13/2023       Progress Note  No chief complaint on file.    Subjective:   Savannah Washington is a 62 y.o. female, presents to clinic for routine f/up - labs done a few weeks ago Pt has not come in for f/up or been on most of her meds recently  COPD - on trelegy   DM - well controlled on metformin 1000 mg BID Lab Results  Component Value Date   HGBA1C 6.2 (H) 12/24/2022   HTN - lisinopril 5 mg (previously was mostly for renal protection with T2DM) BP high last OV with sig pain BP Readings from Last 3 Encounters:  12/24/22 (!) 164/96  05/07/22 119/87  02/22/22 108/72   HLD-  on lipitor 80 and vascepa - not compliant with meds prior to labs Meds refilled Pt reports *** med compliance Lab Results  Component Value Date   CHOL 250 (H) 12/24/2022   HDL 85 12/24/2022   LDLCALC 126 (H) 12/24/2022   TRIG 256 (H) 12/24/2022   CHOLHDL 2.9 12/24/2022     ***   Current Outpatient Medications:    albuterol (PROAIR HFA) 108 (90 Base) MCG/ACT inhaler, Inhale 2 puffs into the lungs every 6 (six) hours as needed for wheezing or shortness of breath., Disp: 1 each, Rfl: 5   atorvastatin (LIPITOR) 80 MG tablet, TAKE 1 TABLET BY MOUTH AT BEDTIME, Disp: 90 tablet, Rfl: 1   Baclofen 5 MG TABS, Take 1-2 tablets (5-10 mg total) by mouth 3 (three) times daily as needed (msk pain or spasms)., Disp: 60 tablet, Rfl: 1   buPROPion (WELLBUTRIN XL) 300 MG 24 hr tablet, Take 1 tablet by mouth once daily, Disp: 90 tablet, Rfl: 0   busPIRone (BUSPAR) 15 MG tablet, Take 1 tablet (15 mg total) by mouth 2 (two) times daily., Disp: 180 tablet, Rfl: 3   celecoxib (CELEBREX) 100 MG capsule, Take 1 capsule (100 mg total) by mouth 2 (two) times daily., Disp: 60 capsule, Rfl: 0   cetirizine (ZYRTEC) 10 MG tablet, Take 10 mg by mouth daily., Disp: , Rfl:    Fluticasone-Umeclidin-Vilant (TRELEGY ELLIPTA) 100-62.5-25 MCG/INH AEPB, Inhale 1 each  into the lungs daily., Disp: 1 each, Rfl: 12   icosapent Ethyl (VASCEPA) 1 g capsule, Take 2 capsules by mouth twice daily, Disp: 90 capsule, Rfl: 0   ipratropium-albuterol (DUONEB) 0.5-2.5 (3) MG/3ML SOLN, Take 3 mLs by nebulization every 8 (eight) hours as needed., Disp: 180 mL, Rfl: 1   lisinopril (ZESTRIL) 5 MG tablet, Take 1 tablet by mouth once daily, Disp: 90 tablet, Rfl: 0   metFORMIN (GLUCOPHAGE) 1000 MG tablet, Take 1 tablet (1,000 mg total) by mouth 2 (two) times daily with a meal., Disp: 180 tablet, Rfl: 0   montelukast (SINGULAIR) 10 MG tablet, Take 1 tablet (10 mg total) by mouth at bedtime., Disp: 30 tablet, Rfl: 3   Multiple Vitamins-Minerals (MULTI COMPLETE PO), Take 1,000 Units by mouth daily., Disp: , Rfl:    nystatin (MYCOSTATIN/NYSTOP) powder, APPLY  POWDER TOPICALLY THREE TIMES DAILY TO AFFECTED AREA AS NEEDED, Disp: 60 g, Rfl: 1   pantoprazole (PROTONIX) 40 MG tablet, Take 1 tablet (40 mg total) by mouth daily., Disp: 30 tablet, Rfl: 3   pregabalin (LYRICA) 25 MG capsule, Take 1 capsule (25 mg total) by mouth 2 (two) times daily. For neck or musculoskeletal pain, Disp: 180 capsule, Rfl: 0  Turmeric 500 MG CAPS, Take 500 mg by mouth daily., Disp: , Rfl:   Patient Active Problem List   Diagnosis Date Noted   Osteopenia 06/04/2022   Current mild episode of major depressive disorder, unspecified whether recurrent 02/12/2021   Type 2 diabetes mellitus with diabetic polyneuropathy, without long-term current use of insulin 02/04/2020   Tobacco abuse 12/01/2018   Aortic atherosclerosis 12/29/2017   Hepatic steatosis 12/29/2017   COPD (chronic obstructive pulmonary disease) 12/05/2017   Degenerative disc disease, cervical 09/04/2017   Cervical arthritis 09/04/2017   Muscle spasm of left shoulder area 09/03/2017   Elevated platelet count 04/11/2017   Marijuana use 03/17/2017   Prediabetes 03/14/2017   Essential hypertension, benign 03/14/2017   Hyperlipidemia 03/14/2017    Anxiety disorder 03/14/2017    Past Surgical History:  Procedure Laterality Date   BREAST BIOPSY Left 07/18/2022   stereo bx, calcs, COIL clip-path pending    Family History  Problem Relation Age of Onset   Dementia Mother    Breast cancer Sister 42   Aneurysm Father    Heart attack Brother    Heart defect Brother    Brain cancer Daughter    Heart attack Paternal Grandfather    Drug abuse Sister     Social History   Tobacco Use   Smoking status: Every Day    Packs/day: 0.50    Years: 44.00    Additional pack years: 0.00    Total pack years: 22.00    Types: Cigarettes    Start date: 08/30/2018   Smokeless tobacco: Never  Vaping Use   Vaping Use: Former  Substance Use Topics   Alcohol use: Yes    Alcohol/week: 5.0 standard drinks of alcohol    Types: 1 Glasses of wine, 4 Cans of beer per week   Drug use: No     No Known Allergies  Health Maintenance  Topic Date Due   DTaP/Tdap/Td (1 - Tdap) Never done   Zoster Vaccines- Shingrix (1 of 2) Never done   COLONOSCOPY (Pts 45-80yrs Insurance coverage will need to be confirmed)  09/30/2021   FOOT EXAM  02/23/2023   INFLUENZA VACCINE  05/01/2023   PAP SMEAR-Modifier  05/09/2023   HEMOGLOBIN A1C  06/26/2023   MAMMOGRAM  07/09/2023   Lung Cancer Screening  11/09/2023   OPHTHALMOLOGY EXAM  11/29/2023   Diabetic kidney evaluation - eGFR measurement  12/24/2023   Diabetic kidney evaluation - Urine ACR  12/24/2023   Hepatitis C Screening  Completed   HIV Screening  Completed   HPV VACCINES  Aged Out   COVID-19 Vaccine  Discontinued    Chart Review Today: ***  Review of Systems   Objective:   There were no vitals filed for this visit.  There is no height or weight on file to calculate BMI.  Physical Exam      Assessment & Plan:   Problem List Items Addressed This Visit       Cardiovascular and Mediastinum   Essential hypertension, benign - Primary (Chronic)     Respiratory   COPD (chronic  obstructive pulmonary disease) (Chronic)     Endocrine   Type 2 diabetes mellitus with diabetic polyneuropathy, without long-term current use of insulin     Other   Hyperlipidemia   Current mild episode of major depressive disorder, unspecified whether recurrent   Other Visit Diagnoses     Encounter for medication monitoring            No follow-ups  on file.   Danelle Berry, PA-C 01/13/23 11:12 AM

## 2023-01-14 ENCOUNTER — Encounter: Payer: Self-pay | Admitting: Family Medicine

## 2023-01-14 ENCOUNTER — Ambulatory Visit (INDEPENDENT_AMBULATORY_CARE_PROVIDER_SITE_OTHER): Payer: 59 | Admitting: Family Medicine

## 2023-01-14 VITALS — BP 142/76 | HR 78 | Temp 97.8°F | Resp 16 | Ht 61.0 in | Wt 132.2 lb

## 2023-01-14 DIAGNOSIS — I10 Essential (primary) hypertension: Secondary | ICD-10-CM | POA: Diagnosis not present

## 2023-01-14 DIAGNOSIS — Z5181 Encounter for therapeutic drug level monitoring: Secondary | ICD-10-CM

## 2023-01-14 DIAGNOSIS — J449 Chronic obstructive pulmonary disease, unspecified: Secondary | ICD-10-CM

## 2023-01-14 DIAGNOSIS — E1142 Type 2 diabetes mellitus with diabetic polyneuropathy: Secondary | ICD-10-CM

## 2023-01-14 DIAGNOSIS — M542 Cervicalgia: Secondary | ICD-10-CM

## 2023-01-14 DIAGNOSIS — M25512 Pain in left shoulder: Secondary | ICD-10-CM

## 2023-01-14 DIAGNOSIS — I700 Atherosclerosis of aorta: Secondary | ICD-10-CM | POA: Diagnosis not present

## 2023-01-14 DIAGNOSIS — F320 Major depressive disorder, single episode, mild: Secondary | ICD-10-CM | POA: Diagnosis not present

## 2023-01-14 DIAGNOSIS — M546 Pain in thoracic spine: Secondary | ICD-10-CM

## 2023-01-14 DIAGNOSIS — E782 Mixed hyperlipidemia: Secondary | ICD-10-CM | POA: Diagnosis not present

## 2023-01-14 MED ORDER — FLUTICASONE-UMECLIDIN-VILANT 100-62.5-25 MCG/ACT IN AEPB: 1.0000 | INHALATION_SPRAY | Freq: Every day | RESPIRATORY_TRACT | 5 refills | Status: DC

## 2023-01-14 MED ORDER — LISINOPRIL 10 MG PO TABS
10.0000 mg | ORAL_TABLET | Freq: Every day | ORAL | 1 refills | Status: DC
Start: 2023-01-14 — End: 2023-05-29

## 2023-01-14 NOTE — Progress Notes (Signed)
Name: Savannah Washington   MRN: [id number]    DOB: 1960-12-05   Date:01/14/2023       Progress Note  Chief Complaint  Patient presents with   Follow-up   Hypertension   Diabetes   Hyperlipidemia     Subjective:   Savannah Washington is a 62 y.o. female, presents to clinic for routine f/up - labs done a few weeks ago Pt has not come in for f/up or been on most of her meds recently  COPD - on trelegy   DM - well controlled on metformin 1000 mg BID Lab Results  Component Value Date   HGBA1C 6.2 (H) 12/24/2022   HTN - lisinopril 5 mg (previously was mostly for renal protection with T2DM) BP high last OV with sig pain BP Readings from Last 3 Encounters:  01/14/23 (!) 142/76  12/24/22 (!) 164/96  05/07/22 119/87   HLD-  on lipitor 80 and vascepa - not compliant with meds prior to labs Meds refilled  Lab Results  Component Value Date   CHOL 250 (H) 12/24/2022   HDL 85 12/24/2022   LDLCALC 126 (H) 12/24/2022   TRIG 256 (H) 12/24/2022   CHOLHDL 2.9 12/24/2022        Current Outpatient Medications:    albuterol (PROAIR HFA) 108 (90 Base) MCG/ACT inhaler, Inhale 2 puffs into the lungs every 6 (six) hours as needed for wheezing or shortness of breath., Disp: 1 each, Rfl: 5   atorvastatin (LIPITOR) 80 MG tablet, TAKE 1 TABLET BY MOUTH AT BEDTIME, Disp: 90 tablet, Rfl: 1   Baclofen 5 MG TABS, Take 1-2 tablets (5-10 mg total) by mouth 3 (three) times daily as needed (msk pain or spasms)., Disp: 60 tablet, Rfl: 1   buPROPion (WELLBUTRIN XL) 300 MG 24 hr tablet, Take 1 tablet by mouth once daily, Disp: 90 tablet, Rfl: 0   celecoxib (CELEBREX) 100 MG capsule, Take 1 capsule (100 mg total) by mouth 2 (two) times daily., Disp: 60 capsule, Rfl: 0   cetirizine (ZYRTEC) 10 MG tablet, Take 10 mg by mouth daily., Disp: , Rfl:    Fluticasone-Umeclidin-Vilant (TRELEGY ELLIPTA) 100-62.5-25 MCG/INH AEPB, Inhale 1 each into the lungs daily., Disp: 1 each, Rfl: 12   ipratropium-albuterol (DUONEB)  0.5-2.5 (3) MG/3ML SOLN, Take 3 mLs by nebulization every 8 (eight) hours as needed., Disp: 180 mL, Rfl: 1   lisinopril (ZESTRIL) 5 MG tablet, Take 1 tablet by mouth once daily, Disp: 90 tablet, Rfl: 0   meloxicam (MOBIC) 15 MG tablet, Take 15 mg by mouth daily., Disp: , Rfl:    montelukast (SINGULAIR) 10 MG tablet, Take 1 tablet (10 mg total) by mouth at bedtime., Disp: 30 tablet, Rfl: 3   Multiple Vitamins-Minerals (MULTI COMPLETE PO), Take 1,000 Units by mouth daily., Disp: , Rfl:    nystatin (MYCOSTATIN/NYSTOP) powder, APPLY  POWDER TOPICALLY THREE TIMES DAILY TO AFFECTED AREA AS NEEDED, Disp: 60 g, Rfl: 1   pregabalin (LYRICA) 25 MG capsule, Take 1 capsule (25 mg total) by mouth 2 (two) times daily. For neck or musculoskeletal pain, Disp: 180 capsule, Rfl: 0   Turmeric 500 MG CAPS, Take 500 mg by mouth daily., Disp: , Rfl:    busPIRone (BUSPAR) 15 MG tablet, Take 1 tablet (15 mg total) by mouth 2 (two) times daily. (Patient not taking: Reported on 01/14/2023), Disp: 180 tablet, Rfl: 3   icosapent Ethyl (VASCEPA) 1 g capsule, Take 2 capsules by mouth twice daily (Patient not taking: Reported on  01/14/2023), Disp: 90 capsule, Rfl: 0   metFORMIN (GLUCOPHAGE) 1000 MG tablet, Take 1 tablet (1,000 mg total) by mouth 2 (two) times daily with a meal. (Patient not taking: Reported on 01/14/2023), Disp: 180 tablet, Rfl: 0   pantoprazole (PROTONIX) 40 MG tablet, Take 1 tablet (40 mg total) by mouth daily. (Patient not taking: Reported on 01/14/2023), Disp: 30 tablet, Rfl: 3  Patient Active Problem List   Diagnosis Date Noted   Osteopenia 06/04/2022   Current mild episode of major depressive disorder, unspecified whether recurrent 02/12/2021   Type 2 diabetes mellitus with diabetic polyneuropathy, without long-term current use of insulin 02/04/2020   Tobacco abuse 12/01/2018   Aortic atherosclerosis 12/29/2017   Hepatic steatosis 12/29/2017   COPD (chronic obstructive pulmonary disease) 12/05/2017    Degenerative disc disease, cervical 09/04/2017   Cervical arthritis 09/04/2017   Muscle spasm of left shoulder area 09/03/2017   Elevated platelet count 04/11/2017   Marijuana use 03/17/2017   Prediabetes 03/14/2017   Essential hypertension, benign 03/14/2017   Hyperlipidemia 03/14/2017   Anxiety disorder 03/14/2017    Past Surgical History:  Procedure Laterality Date   BREAST BIOPSY Left 07/18/2022   stereo bx, calcs, COIL clip-path pending    Family History  Problem Relation Age of Onset   Dementia Mother    Breast cancer Sister 35   Aneurysm Father    Heart attack Brother    Heart defect Brother    Brain cancer Daughter    Heart attack Paternal Grandfather    Drug abuse Sister     Social History   Tobacco Use   Smoking status: Every Day    Packs/day: 0.50    Years: 44.00    Additional pack years: 0.00    Total pack years: 22.00    Types: Cigarettes    Start date: 08/30/2018   Smokeless tobacco: Never  Vaping Use   Vaping Use: Former  Substance Use Topics   Alcohol use: Yes    Alcohol/week: 5.0 standard drinks of alcohol    Types: 1 Glasses of wine, 4 Cans of beer per week   Drug use: No     No Known Allergies  Health Maintenance  Topic Date Due   DTaP/Tdap/Td (1 - Tdap) Never done   COLONOSCOPY (Pts 45-19yrs Insurance coverage will need to be confirmed)  09/30/2021   Zoster Vaccines- Shingrix (1 of 2) 04/15/2023 (Originally 01/28/2010)   FOOT EXAM  02/23/2023   INFLUENZA VACCINE  05/01/2023   PAP SMEAR-Modifier  05/09/2023   HEMOGLOBIN A1C  06/26/2023   MAMMOGRAM  07/09/2023   Lung Cancer Screening  11/09/2023   OPHTHALMOLOGY EXAM  11/29/2023   Diabetic kidney evaluation - eGFR measurement  12/24/2023   Diabetic kidney evaluation - Urine ACR  12/24/2023   Hepatitis C Screening  Completed   HIV Screening  Completed   HPV VACCINES  Aged Out   COVID-19 Vaccine  Discontinued    Chart Review Today: I personally reviewed active problem list,  medication list, allergies, family history, social history, health maintenance, notes from last encounter, lab results, imaging with the patient/caregiver today.   Review of Systems  Constitutional: Negative.   HENT: Negative.    Eyes: Negative.   Respiratory: Negative.    Cardiovascular: Negative.   Gastrointestinal: Negative.   Endocrine: Negative.   Genitourinary: Negative.   Musculoskeletal: Negative.   Skin: Negative.   Allergic/Immunologic: Negative.   Neurological: Negative.   Hematological: Negative.   Psychiatric/Behavioral: Negative.    All  other systems reviewed and are negative.    Objective:   Vitals:   01/14/23 1134 01/14/23 1150  BP: (!) 156/90 (!) 142/76  Pulse: 78   Resp: 16   Temp: 97.8 F (36.6 C)   TempSrc: Oral   SpO2: 96%   Weight: 132 lb 3.2 oz (60 kg)   Height: 5\' 1"  (1.549 m)     Body mass index is 24.98 kg/m.  Physical Exam Vitals and nursing note reviewed.  Constitutional:      General: She is not in acute distress.    Appearance: She is well-developed. She is not ill-appearing, toxic-appearing or diaphoretic.  HENT:     Head: Normocephalic and atraumatic.     Right Ear: External ear normal.     Left Ear: External ear normal.     Nose: Nose normal.  Eyes:     General:        Right eye: No discharge.        Left eye: No discharge.     Conjunctiva/sclera: Conjunctivae normal.  Neck:     Trachea: Trachea and phonation normal. No tracheal deviation.  Cardiovascular:     Rate and Rhythm: Normal rate and regular rhythm.     Pulses: Normal pulses.     Heart sounds: Normal heart sounds. No murmur heard.    No friction rub. No gallop.  Pulmonary:     Effort: Pulmonary effort is normal. No respiratory distress.     Breath sounds: Normal breath sounds. No stridor. No wheezing, rhonchi or rales.  Musculoskeletal:     Cervical back: No erythema.     Right lower leg: No edema.     Left lower leg: No edema.  Skin:    General: Skin is  warm and dry.     Capillary Refill: Capillary refill takes less than 2 seconds.     Coloration: Skin is not jaundiced or pale.     Findings: No rash.  Neurological:     Mental Status: She is alert. Mental status is at baseline.     Motor: No abnormal muscle tone.     Coordination: Coordination normal.     Gait: Gait normal.  Psychiatric:        Mood and Affect: Mood normal.        Behavior: Behavior normal.         Assessment & Plan:   Problem List Items Addressed This Visit       Cardiovascular and Mediastinum   Essential hypertension, benign - Primary (Chronic)    BP elevated today, a little better than recent visit, but at that time she was having fairly severe pain Restarted lisinopril 5 mg mostly was on in the past for renal protection, discussed increasing dose for BP control - pt agrees - change to lisinopril 10 mg daily, f/up BP recheck in office in 2-4 weeks BP Readings from Last 3 Encounters:  01/14/23 (!) 142/76  12/24/22 (!) 164/96  05/07/22 119/87        Relevant Medications   lisinopril (ZESTRIL) 10 MG tablet   Aortic atherosclerosis (Chronic)    Per prior imaging, on statin       Relevant Medications   lisinopril (ZESTRIL) 10 MG tablet     Respiratory   COPD (chronic obstructive pulmonary disease) (Chronic)    On trelegy - refills ordered, overall sx well controlled       Relevant Medications   Fluticasone-Umeclidin-Vilant (TRELEGY ELLIPTA) 100-62.5-25 MCG/ACT AEPB  Endocrine   Type 2 diabetes mellitus with diabetic polyneuropathy, without long-term current use of insulin    Managed on metformin 1000 mg BID Pt on lisinopril and statin (poor compliance in the past) Lab Results  Component Value Date   HGBA1C 6.2 (H) 12/24/2022  A1C is still at goal  Con't metformin and diet/lifestyle efforts, due for DM eye exam       Relevant Medications   lisinopril (ZESTRIL) 10 MG tablet     Other   Hyperlipidemia    Prior poor med compliance with  zetia and high intensity statin Labs recently done and lipids elevated  She will work on better med compliance, diet/lifestyle efforts and we will recheck lipids again in 3-4 months Lab Results  Component Value Date   CHOL 250 (H) 12/24/2022   HDL 85 12/24/2022   LDLCALC 126 (H) 12/24/2022   TRIG 256 (H) 12/24/2022   CHOLHDL 2.9 12/24/2022        Relevant Medications   lisinopril (ZESTRIL) 10 MG tablet   Current mild episode of major depressive disorder, unspecified whether recurrent       01/14/2023   11:32 AM 12/24/2022   10:12 AM 06/19/2022    9:13 AM  Depression screen PHQ 2/9  Decreased Interest 3 0 0  Down, Depressed, Hopeless 3 0 0  PHQ - 2 Score 6 0 0  Altered sleeping 0 0 0  Tired, decreased energy 0 0 0  Change in appetite 0 0 0  Feeling bad or failure about yourself  0 0 0  Trouble concentrating 0 0 0  Moving slowly or fidgety/restless 0 0 0  Suicidal thoughts 0 0 0  PHQ-9 Score 6 0 0  Difficult doing work/chores Somewhat difficult Not difficult at all Not difficult at all  Phq 9 reviewed and mildly positive  On wellbutrin 300 mg      Other Visit Diagnoses     Encounter for medication monitoring       Relevant Medications   Fluticasone-Umeclidin-Vilant (TRELEGY ELLIPTA) 100-62.5-25 MCG/ACT AEPB   Acute left-sided thoracic back pain       Relevant Medications   meloxicam (MOBIC) 15 MG tablet   Other Relevant Orders   Ambulatory referral to Orthopedics   Acute pain of left shoulder       Relevant Orders   Ambulatory referral to Orthopedics   Neck pain       Relevant Orders   Ambulatory referral to Orthopedics        Return for BP recheck 2-4 weeks (nurse recheck) 3 month OV with labs.   Danelle Berry, PA-C 01/14/23 11:51 AM

## 2023-01-23 ENCOUNTER — Encounter: Payer: Self-pay | Admitting: Family Medicine

## 2023-01-23 NOTE — Assessment & Plan Note (Signed)
    01/14/2023   11:32 AM 12/24/2022   10:12 AM 06/19/2022    9:13 AM  Depression screen PHQ 2/9  Decreased Interest 3 0 0  Down, Depressed, Hopeless 3 0 0  PHQ - 2 Score 6 0 0  Altered sleeping 0 0 0  Tired, decreased energy 0 0 0  Change in appetite 0 0 0  Feeling bad or failure about yourself  0 0 0  Trouble concentrating 0 0 0  Moving slowly or fidgety/restless 0 0 0  Suicidal thoughts 0 0 0  PHQ-9 Score 6 0 0  Difficult doing work/chores Somewhat difficult Not difficult at all Not difficult at all   Phq 9 reviewed and mildly positive  On wellbutrin 300 mg

## 2023-01-23 NOTE — Assessment & Plan Note (Signed)
Prior poor med compliance with zetia and high intensity statin Labs recently done and lipids elevated  She will work on better med compliance, diet/lifestyle efforts and we will recheck lipids again in 3-4 months Lab Results  Component Value Date   CHOL 250 (H) 12/24/2022   HDL 85 12/24/2022   LDLCALC 126 (H) 12/24/2022   TRIG 256 (H) 12/24/2022   CHOLHDL 2.9 12/24/2022

## 2023-01-23 NOTE — Assessment & Plan Note (Signed)
Per prior imaging, on statin

## 2023-01-23 NOTE — Assessment & Plan Note (Signed)
On trelegy - refills ordered, overall sx well controlled

## 2023-01-23 NOTE — Assessment & Plan Note (Signed)
BP elevated today, a little better than recent visit, but at that time she was having fairly severe pain Restarted lisinopril 5 mg mostly was on in the past for renal protection, discussed increasing dose for BP control - pt agrees - change to lisinopril 10 mg daily, f/up BP recheck in office in 2-4 weeks BP Readings from Last 3 Encounters:  01/14/23 (!) 142/76  12/24/22 (!) 164/96  05/07/22 119/87

## 2023-01-23 NOTE — Assessment & Plan Note (Signed)
Managed on metformin 1000 mg BID Pt on lisinopril and statin (poor compliance in the past) Lab Results  Component Value Date   HGBA1C 6.2 (H) 12/24/2022  A1C is still at goal  Con't metformin and diet/lifestyle efforts, due for DM eye exam

## 2023-01-28 ENCOUNTER — Ambulatory Visit: Payer: 59

## 2023-02-01 DIAGNOSIS — J302 Other seasonal allergic rhinitis: Secondary | ICD-10-CM | POA: Diagnosis not present

## 2023-02-01 DIAGNOSIS — E785 Hyperlipidemia, unspecified: Secondary | ICD-10-CM | POA: Diagnosis not present

## 2023-02-01 DIAGNOSIS — G629 Polyneuropathy, unspecified: Secondary | ICD-10-CM | POA: Diagnosis not present

## 2023-02-01 DIAGNOSIS — Z791 Long term (current) use of non-steroidal anti-inflammatories (NSAID): Secondary | ICD-10-CM | POA: Diagnosis not present

## 2023-02-01 DIAGNOSIS — J439 Emphysema, unspecified: Secondary | ICD-10-CM | POA: Diagnosis not present

## 2023-02-01 DIAGNOSIS — I700 Atherosclerosis of aorta: Secondary | ICD-10-CM | POA: Diagnosis not present

## 2023-02-01 DIAGNOSIS — Z803 Family history of malignant neoplasm of breast: Secondary | ICD-10-CM | POA: Diagnosis not present

## 2023-02-01 DIAGNOSIS — M1990 Unspecified osteoarthritis, unspecified site: Secondary | ICD-10-CM | POA: Diagnosis not present

## 2023-02-01 DIAGNOSIS — F325 Major depressive disorder, single episode, in full remission: Secondary | ICD-10-CM | POA: Diagnosis not present

## 2023-02-01 DIAGNOSIS — M8580 Other specified disorders of bone density and structure, unspecified site: Secondary | ICD-10-CM | POA: Diagnosis not present

## 2023-02-01 DIAGNOSIS — N181 Chronic kidney disease, stage 1: Secondary | ICD-10-CM | POA: Diagnosis not present

## 2023-02-01 DIAGNOSIS — I129 Hypertensive chronic kidney disease with stage 1 through stage 4 chronic kidney disease, or unspecified chronic kidney disease: Secondary | ICD-10-CM | POA: Diagnosis not present

## 2023-02-04 ENCOUNTER — Ambulatory Visit: Payer: 59

## 2023-02-14 ENCOUNTER — Other Ambulatory Visit: Payer: Self-pay | Admitting: Family Medicine

## 2023-02-18 DIAGNOSIS — M19012 Primary osteoarthritis, left shoulder: Secondary | ICD-10-CM | POA: Diagnosis not present

## 2023-02-18 DIAGNOSIS — M4302 Spondylolysis, cervical region: Secondary | ICD-10-CM | POA: Diagnosis not present

## 2023-02-18 DIAGNOSIS — M5412 Radiculopathy, cervical region: Secondary | ICD-10-CM | POA: Diagnosis not present

## 2023-04-14 ENCOUNTER — Other Ambulatory Visit: Payer: Self-pay | Admitting: Family Medicine

## 2023-04-14 DIAGNOSIS — Z87891 Personal history of nicotine dependence: Secondary | ICD-10-CM

## 2023-04-14 DIAGNOSIS — F418 Other specified anxiety disorders: Secondary | ICD-10-CM

## 2023-04-16 ENCOUNTER — Ambulatory Visit: Payer: 59 | Admitting: Family Medicine

## 2023-05-26 ENCOUNTER — Ambulatory Visit: Payer: 59 | Admitting: Family Medicine

## 2023-05-27 ENCOUNTER — Ambulatory Visit (INDEPENDENT_AMBULATORY_CARE_PROVIDER_SITE_OTHER): Payer: 59 | Admitting: Family Medicine

## 2023-05-27 ENCOUNTER — Encounter: Payer: Self-pay | Admitting: Family Medicine

## 2023-05-27 VITALS — BP 138/82 | HR 97 | Temp 98.2°F | Resp 16 | Ht 61.0 in | Wt 140.8 lb

## 2023-05-27 DIAGNOSIS — K760 Fatty (change of) liver, not elsewhere classified: Secondary | ICD-10-CM

## 2023-05-27 DIAGNOSIS — F418 Other specified anxiety disorders: Secondary | ICD-10-CM | POA: Diagnosis not present

## 2023-05-27 DIAGNOSIS — Z720 Tobacco use: Secondary | ICD-10-CM | POA: Diagnosis not present

## 2023-05-27 DIAGNOSIS — E1142 Type 2 diabetes mellitus with diabetic polyneuropathy: Secondary | ICD-10-CM

## 2023-05-27 DIAGNOSIS — M546 Pain in thoracic spine: Secondary | ICD-10-CM

## 2023-05-27 DIAGNOSIS — J449 Chronic obstructive pulmonary disease, unspecified: Secondary | ICD-10-CM

## 2023-05-27 DIAGNOSIS — Z87891 Personal history of nicotine dependence: Secondary | ICD-10-CM

## 2023-05-27 DIAGNOSIS — Z5181 Encounter for therapeutic drug level monitoring: Secondary | ICD-10-CM

## 2023-05-27 DIAGNOSIS — M542 Cervicalgia: Secondary | ICD-10-CM

## 2023-05-27 DIAGNOSIS — I700 Atherosclerosis of aorta: Secondary | ICD-10-CM | POA: Diagnosis not present

## 2023-05-27 DIAGNOSIS — Z1211 Encounter for screening for malignant neoplasm of colon: Secondary | ICD-10-CM

## 2023-05-27 DIAGNOSIS — E782 Mixed hyperlipidemia: Secondary | ICD-10-CM | POA: Diagnosis not present

## 2023-05-27 DIAGNOSIS — M5030 Other cervical disc degeneration, unspecified cervical region: Secondary | ICD-10-CM

## 2023-05-27 DIAGNOSIS — I10 Essential (primary) hypertension: Secondary | ICD-10-CM | POA: Diagnosis not present

## 2023-05-27 DIAGNOSIS — M5442 Lumbago with sciatica, left side: Secondary | ICD-10-CM | POA: Diagnosis not present

## 2023-05-27 DIAGNOSIS — M5441 Lumbago with sciatica, right side: Secondary | ICD-10-CM

## 2023-05-27 DIAGNOSIS — F320 Major depressive disorder, single episode, mild: Secondary | ICD-10-CM

## 2023-05-27 DIAGNOSIS — M5410 Radiculopathy, site unspecified: Secondary | ICD-10-CM

## 2023-05-27 MED ORDER — BACLOFEN 5 MG PO TABS
5.0000 mg | ORAL_TABLET | Freq: Three times a day (TID) | ORAL | 1 refills | Status: DC | PRN
Start: 2023-05-27 — End: 2023-11-03

## 2023-05-27 MED ORDER — BUPROPION HCL ER (XL) 300 MG PO TB24
300.0000 mg | ORAL_TABLET | Freq: Every day | ORAL | 0 refills | Status: DC
Start: 2023-05-27 — End: 2023-12-01

## 2023-05-27 MED ORDER — PREDNISONE 20 MG PO TABS
ORAL_TABLET | ORAL | 0 refills | Status: DC
Start: 2023-05-27 — End: 2023-11-03

## 2023-05-27 NOTE — Progress Notes (Signed)
Name: Savannah Washington   MRN: [id number]    DOB: 1961-09-15   Date:05/27/2023       Progress Note  Chief Complaint  Patient presents with   Follow-up   Hypertension   Diabetes     Subjective:   Savannah Washington is a 62 y.o. female, presents to clinic for routine f/up on chronic conditions  DM:   Pt quit metformin a while ago GI SE from metformin Denies: Polyuria, polydipsia, vision changes, neuropathy, hypoglycemia Recent pertinent labs: Lab Results  Component Value Date   HGBA1C 6.2 (H) 12/24/2022   HGBA1C 5.5 02/22/2022   HGBA1C 5.7 (H) 11/21/2020  Standard of care and health maintenance: Urine Microalbumin:  done in march Foot exam:  due DM eye exam:  done  Hypertension:  Currently managed on lisinopril 10  Pt reports good med compliance and denies any SE.   Blood pressure today is near goal BP Readings from Last 6 Encounters:  05/27/23 138/82  01/14/23 (!) 142/76  12/24/22 (!) 164/96  05/07/22 119/87  02/22/22 108/72  03/26/21 122/74   Pt denies CP, SOB, exertional sx, LE edema, palpitation, Ha's, visual disturbances, lightheadedness, hypotension, syncope. Still having pain with MSK issues  Hips/sciatica, neck and shoulders  Hyperlipidemia: Currently treated with lipitor 80 and was on vascepa - last labs pt was off her meds - due for recheck today, Last Lipids: Lab Results  Component Value Date   CHOL 250 (H) 12/24/2022   HDL 85 12/24/2022   LDLCALC 126 (H) 12/24/2022   TRIG 256 (H) 12/24/2022   CHOLHDL 2.9 12/24/2022   - Denies: Chest pain, shortness of breath, myalgias, claudication  Emphysema/COPD current smoker on trelegy,   MSK pain to neck/shoulders - seeing ortho  On lyrica, muscle relaxers, celebrex   Anxiety/MDD/chronic pain On wellbutrin    Current Outpatient Medications:    albuterol (PROAIR HFA) 108 (90 Base) MCG/ACT inhaler, Inhale 2 puffs into the lungs every 6 (six) hours as needed for wheezing or shortness of breath., Disp: 1  each, Rfl: 5   atorvastatin (LIPITOR) 80 MG tablet, TAKE 1 TABLET BY MOUTH AT BEDTIME, Disp: 90 tablet, Rfl: 1   buPROPion (WELLBUTRIN XL) 300 MG 24 hr tablet, Take 1 tablet by mouth once daily, Disp: 90 tablet, Rfl: 0   cetirizine (ZYRTEC) 10 MG tablet, Take 10 mg by mouth daily., Disp: , Rfl:    Fluticasone-Umeclidin-Vilant (TRELEGY ELLIPTA) 100-62.5-25 MCG/ACT AEPB, Inhale 1 each into the lungs daily., Disp: 60 each, Rfl: 5   ipratropium-albuterol (DUONEB) 0.5-2.5 (3) MG/3ML SOLN, Take 3 mLs by nebulization every 8 (eight) hours as needed., Disp: 180 mL, Rfl: 1   lisinopril (ZESTRIL) 10 MG tablet, Take 1 tablet (10 mg total) by mouth daily., Disp: 90 tablet, Rfl: 1   meloxicam (MOBIC) 15 MG tablet, Take 1 tablet by mouth once daily, Disp: 90 tablet, Rfl: 0   montelukast (SINGULAIR) 10 MG tablet, Take 1 tablet (10 mg total) by mouth at bedtime., Disp: 30 tablet, Rfl: 3   Multiple Vitamins-Minerals (MULTI COMPLETE PO), Take 1,000 Units by mouth daily., Disp: , Rfl:    nystatin (MYCOSTATIN/NYSTOP) powder, APPLY  POWDER TOPICALLY THREE TIMES DAILY TO AFFECTED AREA AS NEEDED, Disp: 60 g, Rfl: 1   pregabalin (LYRICA) 25 MG capsule, Take 1 capsule (25 mg total) by mouth 2 (two) times daily. For neck or musculoskeletal pain, Disp: 180 capsule, Rfl: 0   Turmeric 500 MG CAPS, Take 500 mg by mouth daily., Disp: , Rfl:  Baclofen 5 MG TABS, Take 1-2 tablets (5-10 mg total) by mouth 3 (three) times daily as needed (msk pain or spasms). (Patient not taking: Reported on 05/27/2023), Disp: 60 tablet, Rfl: 1   celecoxib (CELEBREX) 100 MG capsule, Take 1 capsule (100 mg total) by mouth 2 (two) times daily. (Patient not taking: Reported on 05/27/2023), Disp: 60 capsule, Rfl: 0   metFORMIN (GLUCOPHAGE) 1000 MG tablet, Take 1 tablet (1,000 mg total) by mouth 2 (two) times daily with a meal. (Patient not taking: Reported on 01/14/2023), Disp: 180 tablet, Rfl: 0   pantoprazole (PROTONIX) 40 MG tablet, Take 1 tablet (40 mg  total) by mouth daily. (Patient not taking: Reported on 01/14/2023), Disp: 30 tablet, Rfl: 3  Patient Active Problem List   Diagnosis Date Noted   Osteopenia 06/04/2022   Current mild episode of major depressive disorder, unspecified whether recurrent (HCC) 02/12/2021   Type 2 diabetes mellitus with diabetic polyneuropathy, without long-term current use of insulin (HCC) 02/04/2020   Tobacco abuse 12/01/2018   Aortic atherosclerosis (HCC) 12/29/2017   Hepatic steatosis 12/29/2017   COPD (chronic obstructive pulmonary disease) (HCC) 12/05/2017   Degenerative disc disease, cervical 09/04/2017   Cervical arthritis 09/04/2017   Muscle spasm of left shoulder area 09/03/2017   Elevated platelet count 04/11/2017   Marijuana use 03/17/2017   Prediabetes 03/14/2017   Essential hypertension, benign 03/14/2017   Hyperlipidemia 03/14/2017   Anxiety disorder 03/14/2017    Past Surgical History:  Procedure Laterality Date   BREAST BIOPSY Left 07/18/2022   stereo bx, calcs, COIL clip-path pending    Family History  Problem Relation Age of Onset   Dementia Mother    Breast cancer Sister 56   Aneurysm Father    Heart attack Brother    Heart defect Brother    Brain cancer Daughter    Heart attack Paternal Grandfather    Drug abuse Sister     Social History   Tobacco Use   Smoking status: Every Day    Current packs/day: 0.50    Average packs/day: 0.5 packs/day for 44.0 years (22.0 ttl pk-yrs)    Types: Cigarettes    Start date: 08/30/2018   Smokeless tobacco: Never  Vaping Use   Vaping status: Former  Substance Use Topics   Alcohol use: Yes    Alcohol/week: 5.0 standard drinks of alcohol    Types: 1 Glasses of wine, 4 Cans of beer per week   Drug use: No     No Known Allergies  Health Maintenance  Topic Date Due   DTaP/Tdap/Td (1 - Tdap) Never done   Colonoscopy  09/30/2021   FOOT EXAM  02/23/2023   INFLUENZA VACCINE  05/01/2023   PAP SMEAR-Modifier  05/09/2023   Zoster  Vaccines- Shingrix (1 of 2) 08/27/2023 (Originally 01/28/2010)   HEMOGLOBIN A1C  06/26/2023   MAMMOGRAM  07/09/2023   Lung Cancer Screening  11/09/2023   OPHTHALMOLOGY EXAM  11/29/2023   Diabetic kidney evaluation - eGFR measurement  12/24/2023   Diabetic kidney evaluation - Urine ACR  12/24/2023   Hepatitis C Screening  Completed   HIV Screening  Completed   HPV VACCINES  Aged Out   COVID-19 Vaccine  Discontinued    Chart Review Today: I personally reviewed active problem list, medication list, allergies, family history, social history, health maintenance, notes from last encounter, lab results, imaging with the patient/caregiver today.   Review of Systems  Constitutional: Negative.   HENT: Negative.    Eyes: Negative.  Respiratory: Negative.    Cardiovascular: Negative.   Gastrointestinal: Negative.   Endocrine: Negative.   Genitourinary: Negative.   Musculoskeletal: Negative.   Skin: Negative.   Allergic/Immunologic: Negative.   Neurological: Negative.   Hematological: Negative.   Psychiatric/Behavioral: Negative.    All other systems reviewed and are negative.    Objective:   Vitals:   05/27/23 1413  BP: 138/82  Pulse: 97  Resp: 16  Temp: 98.2 F (36.8 C)  TempSrc: Oral  SpO2: 95%  Weight: 140 lb 12.8 oz (63.9 kg)  Height: 5\' 1"  (1.549 m)    Body mass index is 26.6 kg/m.  Physical Exam Vitals and nursing note reviewed.  Constitutional:      General: She is not in acute distress.    Appearance: Normal appearance. She is well-developed. She is not ill-appearing, toxic-appearing or diaphoretic.     Interventions: Face mask in place.  HENT:     Head: Normocephalic and atraumatic.     Right Ear: External ear normal.     Left Ear: External ear normal.     Nose: Nose normal.  Eyes:     General: Lids are normal. No scleral icterus.       Right eye: No discharge.        Left eye: No discharge.     Conjunctiva/sclera: Conjunctivae normal.  Neck:      Trachea: Trachea and phonation normal. No tracheal deviation.  Cardiovascular:     Rate and Rhythm: Normal rate and regular rhythm.     Pulses: Normal pulses.          Radial pulses are 2+ on the right side and 2+ on the left side.       Posterior tibial pulses are 2+ on the right side and 2+ on the left side.     Heart sounds: Normal heart sounds. No murmur heard.    No friction rub. No gallop.  Pulmonary:     Effort: Pulmonary effort is normal. No respiratory distress.     Breath sounds: Normal breath sounds. No stridor. No wheezing, rhonchi or rales.  Chest:     Chest wall: No tenderness.  Abdominal:     General: Bowel sounds are normal. There is no distension.     Palpations: Abdomen is soft.  Musculoskeletal:     Cervical back: No erythema.     Right lower leg: No edema.     Left lower leg: No edema.  Skin:    General: Skin is warm and dry.     Capillary Refill: Capillary refill takes less than 2 seconds.     Coloration: Skin is not jaundiced or pale.     Findings: No rash.  Neurological:     Mental Status: She is alert. Mental status is at baseline.     Motor: No abnormal muscle tone.     Coordination: Coordination normal.     Gait: Gait normal.  Psychiatric:        Mood and Affect: Mood normal.        Speech: Speech normal.        Behavior: Behavior normal.         Assessment & Plan:   Problem List Items Addressed This Visit       Cardiovascular and Mediastinum   Essential hypertension, benign (Chronic)   Relevant Orders   COMPLETE METABOLIC PANEL WITH GFR (Completed)   Aortic atherosclerosis (HCC) (Chronic)   Relevant Orders   COMPLETE METABOLIC PANEL WITH GFR (  Completed)   Lipid panel (Completed)     Respiratory   COPD (chronic obstructive pulmonary disease) (HCC) (Chronic)   Relevant Medications   predniSONE (DELTASONE) 20 MG tablet   Other Relevant Orders   CBC with Differential/Platelet (Completed)   COMPLETE METABOLIC PANEL WITH GFR (Completed)      Digestive   Hepatic steatosis (Chronic)   Relevant Orders   COMPLETE METABOLIC PANEL WITH GFR (Completed)     Endocrine   Type 2 diabetes mellitus with diabetic polyneuropathy, without long-term current use of insulin (HCC) - Primary   Relevant Medications   buPROPion (WELLBUTRIN XL) 300 MG 24 hr tablet   Baclofen 5 MG TABS   Other Relevant Orders   COMPLETE METABOLIC PANEL WITH GFR (Completed)   Hemoglobin A1c (Completed)   Lipid panel (Completed)     Musculoskeletal and Integument   Degenerative disc disease, cervical   Relevant Medications   Baclofen 5 MG TABS   predniSONE (DELTASONE) 20 MG tablet   Other Relevant Orders   Ambulatory referral to Physical Medicine Rehab     Other   Hyperlipidemia   Relevant Orders   COMPLETE METABOLIC PANEL WITH GFR (Completed)   Lipid panel (Completed)   Anxiety disorder   Relevant Medications   buPROPion (WELLBUTRIN XL) 300 MG 24 hr tablet   Tobacco abuse   Relevant Orders   CBC with Differential/Platelet (Completed)   COMPLETE METABOLIC PANEL WITH GFR (Completed)   Current mild episode of major depressive disorder, unspecified whether recurrent (HCC)   Relevant Medications   buPROPion (WELLBUTRIN XL) 300 MG 24 hr tablet   Other Visit Diagnoses     Encounter for medication monitoring       Relevant Orders   CBC with Differential/Platelet (Completed)   COMPLETE METABOLIC PANEL WITH GFR (Completed)   Hemoglobin A1c (Completed)   Lipid panel (Completed)   Radiculopathy, unspecified spinal region       Relevant Medications   buPROPion (WELLBUTRIN XL) 300 MG 24 hr tablet   Baclofen 5 MG TABS   Other Relevant Orders   Ambulatory referral to Physical Medicine Rehab   Screening for colon cancer       Relevant Orders   Ambulatory referral to Gastroenterology   History of tobacco use, presenting hazards to health       Relevant Medications   buPROPion (WELLBUTRIN XL) 300 MG 24 hr tablet   Neck pain       Relevant  Medications   Baclofen 5 MG TABS   Acute left-sided thoracic back pain       Relevant Medications   Baclofen 5 MG TABS   predniSONE (DELTASONE) 20 MG tablet   Acute bilateral low back pain with bilateral sciatica       Relevant Medications   buPROPion (WELLBUTRIN XL) 300 MG 24 hr tablet   Baclofen 5 MG TABS   predniSONE (DELTASONE) 20 MG tablet        Return in about 3 months (around 08/27/2023) for Routine follow-up.   Danelle Berry, PA-C 05/27/23 2:27 PM

## 2023-05-28 LAB — CBC WITH DIFFERENTIAL/PLATELET
Absolute Monocytes: 568 {cells}/uL (ref 200–950)
Basophils Absolute: 71 cells/uL (ref 0–200)
Basophils Relative: 1 %
Eosinophils Absolute: 170 {cells}/uL (ref 15–500)
Eosinophils Relative: 2.4 %
HCT: 41 % (ref 35.0–45.0)
Hemoglobin: 13.5 g/dL (ref 11.7–15.5)
Lymphs Abs: 2343 {cells}/uL (ref 850–3900)
MCH: 30.7 pg (ref 27.0–33.0)
MCHC: 32.9 g/dL (ref 32.0–36.0)
MCV: 93.2 fL (ref 80.0–100.0)
MPV: 8.7 fL (ref 7.5–12.5)
Monocytes Relative: 8 %
Neutro Abs: 3948 cells/uL (ref 1500–7800)
Neutrophils Relative %: 55.6 %
Platelets: 411 10*3/uL — ABNORMAL HIGH (ref 140–400)
RBC: 4.4 10*6/uL (ref 3.80–5.10)
RDW: 12.5 % (ref 11.0–15.0)
Total Lymphocyte: 33 %
WBC: 7.1 10*3/uL (ref 3.8–10.8)

## 2023-05-28 LAB — HEMOGLOBIN A1C
Hgb A1c MFr Bld: 6.2 %{Hb} — ABNORMAL HIGH (ref <5.7)
Mean Plasma Glucose: 131 mg/dL
eAG (mmol/L): 7.3 mmol/L

## 2023-05-28 LAB — COMPLETE METABOLIC PANEL WITH GFR
AG Ratio: 1.9 (calc) (ref 1.0–2.5)
ALT: 15 U/L (ref 6–29)
AST: 29 U/L (ref 10–35)
Albumin: 3.9 g/dL (ref 3.6–5.1)
Alkaline phosphatase (APISO): 71 U/L (ref 37–153)
BUN/Creatinine Ratio: 11 (calc) (ref 6–22)
BUN: 12 mg/dL (ref 7–25)
CO2: 30 mmol/L (ref 20–32)
Calcium: 9.2 mg/dL (ref 8.6–10.4)
Chloride: 101 mmol/L (ref 98–110)
Creat: 1.12 mg/dL — ABNORMAL HIGH (ref 0.50–1.05)
Globulin: 2.1 g/dL (ref 1.9–3.7)
Glucose, Bld: 167 mg/dL — ABNORMAL HIGH (ref 65–99)
Potassium: 4.2 mmol/L (ref 3.5–5.3)
Sodium: 139 mmol/L (ref 135–146)
Total Bilirubin: 0.3 mg/dL (ref 0.2–1.2)
Total Protein: 6 g/dL — ABNORMAL LOW (ref 6.1–8.1)
eGFR: 55 mL/min/{1.73_m2} — ABNORMAL LOW (ref >=60)

## 2023-05-28 LAB — LIPID PANEL
Cholesterol: 162 mg/dL (ref <200)
HDL: 60 mg/dL (ref >=50)
LDL Cholesterol (Calc): 65 mg/dL
Non-HDL Cholesterol (Calc): 102 mg/dL (ref <130)
Total CHOL/HDL Ratio: 2.7 (calc) (ref <5.0)
Triglycerides: 281 mg/dL — ABNORMAL HIGH (ref <150)

## 2023-05-29 ENCOUNTER — Other Ambulatory Visit: Payer: Self-pay | Admitting: Family Medicine

## 2023-05-29 ENCOUNTER — Encounter: Payer: Self-pay | Admitting: Family Medicine

## 2023-05-29 ENCOUNTER — Other Ambulatory Visit: Payer: Self-pay

## 2023-05-29 DIAGNOSIS — N179 Acute kidney failure, unspecified: Secondary | ICD-10-CM

## 2023-05-29 DIAGNOSIS — I10 Essential (primary) hypertension: Secondary | ICD-10-CM

## 2023-05-29 DIAGNOSIS — I700 Atherosclerosis of aorta: Secondary | ICD-10-CM

## 2023-05-29 DIAGNOSIS — Z5181 Encounter for therapeutic drug level monitoring: Secondary | ICD-10-CM

## 2023-05-29 DIAGNOSIS — E782 Mixed hyperlipidemia: Secondary | ICD-10-CM

## 2023-05-29 MED ORDER — ATORVASTATIN CALCIUM 80 MG PO TABS
80.0000 mg | ORAL_TABLET | Freq: Every day | ORAL | 11 refills | Status: DC
Start: 2023-05-29 — End: 2024-05-19

## 2023-05-29 MED ORDER — LISINOPRIL 10 MG PO TABS
10.0000 mg | ORAL_TABLET | Freq: Every day | ORAL | 5 refills | Status: DC
Start: 2023-05-29 — End: 2023-12-22

## 2023-05-30 DIAGNOSIS — M4316 Spondylolisthesis, lumbar region: Secondary | ICD-10-CM | POA: Diagnosis not present

## 2023-05-30 DIAGNOSIS — M87052 Idiopathic aseptic necrosis of left femur: Secondary | ICD-10-CM | POA: Diagnosis not present

## 2023-05-30 DIAGNOSIS — M25551 Pain in right hip: Secondary | ICD-10-CM | POA: Diagnosis not present

## 2023-05-30 DIAGNOSIS — M25552 Pain in left hip: Secondary | ICD-10-CM | POA: Diagnosis not present

## 2023-05-30 DIAGNOSIS — M5136 Other intervertebral disc degeneration, lumbar region: Secondary | ICD-10-CM | POA: Diagnosis not present

## 2023-05-30 DIAGNOSIS — M87051 Idiopathic aseptic necrosis of right femur: Secondary | ICD-10-CM | POA: Diagnosis not present

## 2023-05-30 DIAGNOSIS — M5416 Radiculopathy, lumbar region: Secondary | ICD-10-CM | POA: Diagnosis not present

## 2023-06-03 ENCOUNTER — Telehealth: Payer: Self-pay

## 2023-06-03 NOTE — Telephone Encounter (Signed)
Discussed lab results and pt to come 06/23/23 for lab work and appt made for 06/27/23 for OV to discuss results.

## 2023-06-10 ENCOUNTER — Other Ambulatory Visit: Payer: Self-pay | Admitting: Family Medicine

## 2023-06-10 ENCOUNTER — Encounter: Payer: Self-pay | Admitting: *Deleted

## 2023-06-12 NOTE — Telephone Encounter (Signed)
Pt verbalized notified understanding. She stated she will not take it due to not needing it

## 2023-06-16 DIAGNOSIS — M5416 Radiculopathy, lumbar region: Secondary | ICD-10-CM | POA: Diagnosis not present

## 2023-06-16 DIAGNOSIS — M5136 Other intervertebral disc degeneration, lumbar region: Secondary | ICD-10-CM | POA: Diagnosis not present

## 2023-06-24 DIAGNOSIS — N179 Acute kidney failure, unspecified: Secondary | ICD-10-CM | POA: Diagnosis not present

## 2023-06-25 LAB — BASIC METABOLIC PANEL WITH GFR
BUN: 14 mg/dL (ref 7–25)
CO2: 28 mmol/L (ref 20–32)
Calcium: 9.2 mg/dL (ref 8.6–10.4)
Chloride: 101 mmol/L (ref 98–110)
Creat: 0.72 mg/dL (ref 0.50–1.05)
Glucose, Bld: 91 mg/dL (ref 65–99)
Potassium: 4.3 mmol/L (ref 3.5–5.3)
Sodium: 138 mmol/L (ref 135–146)
eGFR: 94 mL/min/{1.73_m2} (ref >=60)

## 2023-06-27 ENCOUNTER — Ambulatory Visit: Payer: Medicaid Other | Admitting: Family Medicine

## 2023-06-30 DIAGNOSIS — M25552 Pain in left hip: Secondary | ICD-10-CM | POA: Diagnosis not present

## 2023-06-30 DIAGNOSIS — M545 Low back pain, unspecified: Secondary | ICD-10-CM | POA: Diagnosis not present

## 2023-06-30 DIAGNOSIS — M25551 Pain in right hip: Secondary | ICD-10-CM | POA: Diagnosis not present

## 2023-07-01 ENCOUNTER — Ambulatory Visit (INDEPENDENT_AMBULATORY_CARE_PROVIDER_SITE_OTHER): Payer: Medicaid Other | Admitting: Physician Assistant

## 2023-07-01 ENCOUNTER — Encounter: Payer: Self-pay | Admitting: Physician Assistant

## 2023-07-01 VITALS — BP 126/72 | HR 95 | Temp 97.5°F | Resp 16 | Ht 61.0 in | Wt 141.1 lb

## 2023-07-01 DIAGNOSIS — N179 Acute kidney failure, unspecified: Secondary | ICD-10-CM | POA: Diagnosis not present

## 2023-07-01 NOTE — Progress Notes (Signed)
Acute Office Visit   Patient: Savannah Washington   DOB: [birth year]   62 y.o. Female  MRN: [id number] Visit Date: 07/01/2023  Today's healthcare provider: Oswaldo Conroy Sonam Wandel, PA-C  Introduced myself to the patient as a Secondary school teacher and provided education on APPs in clinical practice.    Chief Complaint  Patient presents with   Results    Discuss labs   Subjective    HPI HPI     Results    Additional comments: Discuss labs      Last edited by Forde Radon, CMA on 07/01/2023 11:41 AM.      She is here to review her results from her recent labs completed on 06/24/23  Reviewed these- everything looks normal Kidney function appears to be back in normal range  She reports BP has been okay  Most recent A1c was 6.2  She has been taking Tylenol/ ibuprofen mixed as needed - advised to stop this for now and stay well hydrated      Medications: Outpatient Medications Prior to Visit  Medication Sig   albuterol (PROAIR HFA) 108 (90 Base) MCG/ACT inhaler Inhale 2 puffs into the lungs every 6 (six) hours as needed for wheezing or shortness of breath.   atorvastatin (LIPITOR) 80 MG tablet Take 1 tablet (80 mg total) by mouth at bedtime.   Baclofen 5 MG TABS Take 1-2 tablets (5-10 mg total) by mouth 3 (three) times daily as needed (msk pain or spasms).   buPROPion (WELLBUTRIN XL) 300 MG 24 hr tablet Take 1 tablet (300 mg total) by mouth daily.   cetirizine (ZYRTEC) 10 MG tablet Take 10 mg by mouth daily.   Fluticasone-Umeclidin-Vilant (TRELEGY ELLIPTA) 100-62.5-25 MCG/ACT AEPB Inhale 1 each into the lungs daily.   ipratropium-albuterol (DUONEB) 0.5-2.5 (3) MG/3ML SOLN Take 3 mLs by nebulization every 8 (eight) hours as needed.   lisinopril (ZESTRIL) 10 MG tablet Take 1 tablet (10 mg total) by mouth daily.   meloxicam (MOBIC) 15 MG tablet Take 0.5-1 tablets (7.5-15 mg total) by mouth daily as needed for pain (severe pain).   montelukast (SINGULAIR) 10 MG tablet Take 1 tablet (10  mg total) by mouth at bedtime.   Multiple Vitamins-Minerals (MULTI COMPLETE PO) Take 1,000 Units by mouth daily.   nystatin (MYCOSTATIN/NYSTOP) powder APPLY  POWDER TOPICALLY THREE TIMES DAILY TO AFFECTED AREA AS NEEDED   predniSONE (DELTASONE) 20 MG tablet 2 tabs poqday 1-3, 1 tabs poqday 4-6   pregabalin (LYRICA) 25 MG capsule Take 1 capsule (25 mg total) by mouth 2 (two) times daily. For neck or musculoskeletal pain   Turmeric 500 MG CAPS Take 500 mg by mouth daily.   No facility-administered medications prior to visit.    Review of Systems      Objective    BP 126/72   Pulse 95   Temp (!) 97.5 F (36.4 C) (Oral)   Resp 16   Ht 5\' 1"  (1.549 m)   Wt 141 lb 1.6 oz (64 kg)   SpO2 93%   BMI 26.66 kg/m     Physical Exam Vitals reviewed.  Constitutional:      General: She is awake.     Appearance: Normal appearance. She is well-developed and well-groomed.  HENT:     Head: Normocephalic and atraumatic.  Pulmonary:     Effort: Pulmonary effort is normal.  Musculoskeletal:     Cervical back: Normal range of motion.  Neurological:  Mental Status: She is alert.  Psychiatric:        Attention and Perception: Attention and perception normal.        Mood and Affect: Mood and affect normal.        Speech: Speech normal.        Behavior: Behavior normal. Behavior is cooperative.       No results found for any visits on 07/01/23.  Assessment & Plan      No follow-ups on file.       Problem List Items Addressed This Visit   None Visit Diagnoses     Acute kidney injury (HCC)    -  Primary Acute, resolved Patient had significant drop in eGFR to <60 when labs were drawn at the end of Aug with associated increase to creatinine levels Repeat labs from 06/24/23 show resolution of this with eGFR >60 and creatinine of 0.72 Recommend avoiding NSAIDs and staying well hydrated for now Unsure of etiology for drop but suspect acute dehydration at this time? Recommend  recheck at upcoming apt in Nov for monitoring  Continue with current medication regimen to provide BP and glycemic control  Follow up as needed for persistent or progressing symptoms          No follow-ups on file.   I, Khaya Theissen E Janely Gullickson, PA-C, have reviewed all documentation for this visit. The documentation on 07/01/23 for the exam, diagnosis, procedures, and orders are all accurate and complete.   Jacquelin Hawking, MHS, PA-C [facility]

## 2023-07-04 DIAGNOSIS — M25552 Pain in left hip: Secondary | ICD-10-CM | POA: Diagnosis not present

## 2023-07-04 DIAGNOSIS — M25551 Pain in right hip: Secondary | ICD-10-CM | POA: Diagnosis not present

## 2023-07-04 DIAGNOSIS — M545 Low back pain, unspecified: Secondary | ICD-10-CM | POA: Diagnosis not present

## 2023-07-08 DIAGNOSIS — M25551 Pain in right hip: Secondary | ICD-10-CM | POA: Diagnosis not present

## 2023-07-08 DIAGNOSIS — M25552 Pain in left hip: Secondary | ICD-10-CM | POA: Diagnosis not present

## 2023-07-08 DIAGNOSIS — M545 Low back pain, unspecified: Secondary | ICD-10-CM | POA: Diagnosis not present

## 2023-07-11 DIAGNOSIS — M25552 Pain in left hip: Secondary | ICD-10-CM | POA: Diagnosis not present

## 2023-07-11 DIAGNOSIS — M545 Low back pain, unspecified: Secondary | ICD-10-CM | POA: Diagnosis not present

## 2023-07-11 DIAGNOSIS — M25551 Pain in right hip: Secondary | ICD-10-CM | POA: Diagnosis not present

## 2023-07-14 DIAGNOSIS — M25552 Pain in left hip: Secondary | ICD-10-CM | POA: Diagnosis not present

## 2023-07-14 DIAGNOSIS — M545 Low back pain, unspecified: Secondary | ICD-10-CM | POA: Diagnosis not present

## 2023-07-14 DIAGNOSIS — M25551 Pain in right hip: Secondary | ICD-10-CM | POA: Diagnosis not present

## 2023-07-16 ENCOUNTER — Other Ambulatory Visit: Payer: Self-pay | Admitting: Orthopedic Surgery

## 2023-07-16 DIAGNOSIS — M51362 Other intervertebral disc degeneration, lumbar region with discogenic back pain and lower extremity pain: Secondary | ICD-10-CM

## 2023-07-16 DIAGNOSIS — M5416 Radiculopathy, lumbar region: Secondary | ICD-10-CM

## 2023-07-16 DIAGNOSIS — M4316 Spondylolisthesis, lumbar region: Secondary | ICD-10-CM

## 2023-07-18 ENCOUNTER — Encounter: Payer: Self-pay | Admitting: Orthopedic Surgery

## 2023-07-24 ENCOUNTER — Encounter: Payer: Self-pay | Admitting: Orthopedic Surgery

## 2023-07-28 ENCOUNTER — Encounter: Payer: Self-pay | Admitting: Orthopedic Surgery

## 2023-07-29 ENCOUNTER — Encounter: Payer: Self-pay | Admitting: Orthopedic Surgery

## 2023-07-30 ENCOUNTER — Telehealth: Payer: Self-pay | Admitting: Family Medicine

## 2023-07-30 NOTE — Telephone Encounter (Signed)
Called pt back and informed we did not order the MRI, she will need to contact the provider office who did it. Pt verbalized they are already helping her out in getting it fixed

## 2023-07-30 NOTE — Telephone Encounter (Signed)
Copied from CRM [id number]. Topic: General - Inquiry >> Jul 30, 2023 12:21 PM De Blanch wrote: Reason for CRM: Pt stated her MRI has been canceled due to [facility] not paying due to not finishing her therapy.However, she stated she is not with [employer] anymore; she has a [employer]. Pt stated the imaging location and advised her she needs a written letter from [place]. Stating she does not have [employer] anymore. She stated she really needs to get her MRI done and asked if there is anything we can do to help. Please advise.

## 2023-07-31 ENCOUNTER — Other Ambulatory Visit: Payer: Medicaid Other

## 2023-07-31 ENCOUNTER — Other Ambulatory Visit: Payer: Self-pay | Admitting: Orthopedic Surgery

## 2023-07-31 DIAGNOSIS — M5416 Radiculopathy, lumbar region: Secondary | ICD-10-CM

## 2023-07-31 DIAGNOSIS — M51362 Other intervertebral disc degeneration, lumbar region with discogenic back pain and lower extremity pain: Secondary | ICD-10-CM

## 2023-07-31 DIAGNOSIS — M4316 Spondylolisthesis, lumbar region: Secondary | ICD-10-CM

## 2023-08-01 ENCOUNTER — Encounter: Payer: Self-pay | Admitting: Orthopedic Surgery

## 2023-08-07 ENCOUNTER — Ambulatory Visit
Admission: RE | Admit: 2023-08-07 | Discharge: 2023-08-07 | Disposition: A | Discharge status: Home / Self Care | Payer: Medicaid Other | Source: Ambulatory Visit | Attending: Orthopedic Surgery | Admitting: Orthopedic Surgery

## 2023-08-07 DIAGNOSIS — M51362 Other intervertebral disc degeneration, lumbar region with discogenic back pain and lower extremity pain: Secondary | ICD-10-CM

## 2023-08-07 DIAGNOSIS — M48061 Spinal stenosis, lumbar region without neurogenic claudication: Secondary | ICD-10-CM | POA: Diagnosis not present

## 2023-08-07 DIAGNOSIS — M4316 Spondylolisthesis, lumbar region: Secondary | ICD-10-CM

## 2023-08-07 DIAGNOSIS — M47816 Spondylosis without myelopathy or radiculopathy, lumbar region: Secondary | ICD-10-CM | POA: Diagnosis not present

## 2023-08-07 DIAGNOSIS — M5416 Radiculopathy, lumbar region: Secondary | ICD-10-CM

## 2023-08-12 ENCOUNTER — Telehealth: Payer: Self-pay

## 2023-08-12 ENCOUNTER — Other Ambulatory Visit: Payer: Self-pay | Admitting: Family Medicine

## 2023-08-12 DIAGNOSIS — Z1231 Encounter for screening mammogram for malignant neoplasm of breast: Secondary | ICD-10-CM

## 2023-08-12 NOTE — Telephone Encounter (Signed)
PT requesting call back to schedule colonoscopy

## 2023-08-12 NOTE — Telephone Encounter (Signed)
Message left for patient to return my call.  

## 2023-08-13 DIAGNOSIS — M48062 Spinal stenosis, lumbar region with neurogenic claudication: Secondary | ICD-10-CM | POA: Diagnosis not present

## 2023-08-13 DIAGNOSIS — M5416 Radiculopathy, lumbar region: Secondary | ICD-10-CM | POA: Diagnosis not present

## 2023-08-13 NOTE — Telephone Encounter (Signed)
Tried to call patient but VM is full now

## 2023-08-17 ENCOUNTER — Encounter: Payer: Self-pay | Admitting: Emergency Medicine

## 2023-08-17 DIAGNOSIS — R112 Nausea with vomiting, unspecified: Secondary | ICD-10-CM | POA: Diagnosis not present

## 2023-08-17 DIAGNOSIS — K5730 Diverticulosis of large intestine without perforation or abscess without bleeding: Secondary | ICD-10-CM | POA: Diagnosis not present

## 2023-08-17 DIAGNOSIS — R197 Diarrhea, unspecified: Secondary | ICD-10-CM | POA: Diagnosis not present

## 2023-08-17 DIAGNOSIS — R110 Nausea: Secondary | ICD-10-CM | POA: Diagnosis not present

## 2023-08-17 DIAGNOSIS — F17210 Nicotine dependence, cigarettes, uncomplicated: Secondary | ICD-10-CM | POA: Insufficient documentation

## 2023-08-17 DIAGNOSIS — Z79899 Other long term (current) drug therapy: Secondary | ICD-10-CM | POA: Insufficient documentation

## 2023-08-17 DIAGNOSIS — I10 Essential (primary) hypertension: Secondary | ICD-10-CM | POA: Insufficient documentation

## 2023-08-17 DIAGNOSIS — K3580 Unspecified acute appendicitis: Secondary | ICD-10-CM | POA: Diagnosis not present

## 2023-08-17 DIAGNOSIS — R109 Unspecified abdominal pain: Secondary | ICD-10-CM | POA: Diagnosis not present

## 2023-08-17 DIAGNOSIS — R079 Chest pain, unspecified: Secondary | ICD-10-CM | POA: Diagnosis not present

## 2023-08-17 DIAGNOSIS — R1084 Generalized abdominal pain: Secondary | ICD-10-CM | POA: Diagnosis not present

## 2023-08-17 DIAGNOSIS — K353 Acute appendicitis with localized peritonitis, without perforation or gangrene: Principal | ICD-10-CM | POA: Insufficient documentation

## 2023-08-17 LAB — CBC
HCT: 43.6 % (ref 36.0–46.0)
Hemoglobin: 15 g/dL (ref 12.0–15.0)
MCH: 31.3 pg (ref 26.0–34.0)
MCHC: 34.4 g/dL (ref 30.0–36.0)
MCV: 91 fL (ref 80.0–100.0)
Platelets: 433 10*3/uL — ABNORMAL HIGH (ref 150–400)
RBC: 4.79 MIL/uL (ref 3.87–5.11)
RDW: 13 % (ref 11.5–15.5)
WBC: 17.9 10*3/uL — ABNORMAL HIGH (ref 4.0–10.5)
nRBC: 0 % (ref 0.0–0.2)

## 2023-08-17 NOTE — ED Triage Notes (Signed)
Pt arrived via [facility] from home with c/o N/V/D and generalized abdominal pain that radiates around to pts back on bilateral sides. Pt reports symptoms x3 hours.

## 2023-08-18 ENCOUNTER — Other Ambulatory Visit: Payer: Self-pay

## 2023-08-18 ENCOUNTER — Encounter: Payer: Self-pay | Admitting: General Surgery

## 2023-08-18 ENCOUNTER — Observation Stay: Payer: 59 | Admitting: Anesthesiology

## 2023-08-18 ENCOUNTER — Emergency Department: Payer: 59

## 2023-08-18 ENCOUNTER — Observation Stay
Admission: EM | Admit: 2023-08-18 | Discharge: 2023-08-19 | Disposition: A | Discharge status: Home / Self Care | Payer: 59 | Attending: General Surgery | Admitting: General Surgery

## 2023-08-18 ENCOUNTER — Encounter
Admission: EM | Disposition: A | Discharge status: Home / Self Care | Payer: Self-pay | Source: Home / Self Care | Attending: Emergency Medicine

## 2023-08-18 DIAGNOSIS — K3580 Unspecified acute appendicitis: Secondary | ICD-10-CM | POA: Diagnosis not present

## 2023-08-18 DIAGNOSIS — K5730 Diverticulosis of large intestine without perforation or abscess without bleeding: Secondary | ICD-10-CM | POA: Diagnosis not present

## 2023-08-18 DIAGNOSIS — R112 Nausea with vomiting, unspecified: Secondary | ICD-10-CM

## 2023-08-18 DIAGNOSIS — R109 Unspecified abdominal pain: Secondary | ICD-10-CM | POA: Diagnosis not present

## 2023-08-18 DIAGNOSIS — K353 Acute appendicitis with localized peritonitis, without perforation or gangrene: Secondary | ICD-10-CM | POA: Diagnosis present

## 2023-08-18 DIAGNOSIS — K37 Unspecified appendicitis: Secondary | ICD-10-CM | POA: Diagnosis not present

## 2023-08-18 HISTORY — PX: XI ROBOTIC LAPAROSCOPIC ASSISTED APPENDECTOMY: SHX6877

## 2023-08-18 LAB — HIV ANTIBODY (ROUTINE TESTING W REFLEX): HIV Screen 4th Generation wRfx: NONREACTIVE

## 2023-08-18 LAB — URINALYSIS, ROUTINE W REFLEX MICROSCOPIC
Bilirubin Urine: NEGATIVE
Glucose, UA: NEGATIVE mg/dL
Hgb urine dipstick: NEGATIVE
Ketones, ur: 80 mg/dL — AB
Nitrite: NEGATIVE
Protein, ur: NEGATIVE mg/dL
Specific Gravity, Urine: 1.021 (ref 1.005–1.030)
pH: 6 (ref 5.0–8.0)

## 2023-08-18 LAB — COMPREHENSIVE METABOLIC PANEL
ALT: 18 U/L (ref 0–44)
AST: 30 U/L (ref 15–41)
Albumin: 4.1 g/dL (ref 3.5–5.0)
Alkaline Phosphatase: 60 U/L (ref 38–126)
Anion gap: 15 (ref 5–15)
BUN: 16 mg/dL (ref 8–23)
CO2: 22 mmol/L (ref 22–32)
Calcium: 9 mg/dL (ref 8.9–10.3)
Chloride: 99 mmol/L (ref 98–111)
Creatinine, Ser: 0.68 mg/dL (ref 0.44–1.00)
GFR, Estimated: >60 mL/min (ref >60)
Glucose, Bld: 154 mg/dL — ABNORMAL HIGH (ref 70–99)
Potassium: 3.7 mmol/L (ref 3.5–5.1)
Sodium: 136 mmol/L (ref 135–145)
Total Bilirubin: 0.7 mg/dL (ref <1.2)
Total Protein: 6.9 g/dL (ref 6.5–8.1)

## 2023-08-18 LAB — TROPONIN I (HIGH SENSITIVITY)
Troponin I (High Sensitivity): 4 ng/L (ref <18)
Troponin I (High Sensitivity): 4 ng/L (ref <18)

## 2023-08-18 LAB — LIPASE, BLOOD: Lipase: 22 U/L (ref 11–51)

## 2023-08-18 SURGERY — APPENDECTOMY, ROBOT-ASSISTED, LAPAROSCOPIC
Anesthesia: General

## 2023-08-18 MED ORDER — MORPHINE SULFATE (PF) 4 MG/ML IV SOLN
4.0000 mg | INTRAVENOUS | Status: DC | PRN
  Administered 2023-08-18: 4 mg via INTRAVENOUS

## 2023-08-18 MED ORDER — PIPERACILLIN-TAZOBACTAM 3.375 G IVPB 30 MIN
3.3750 g | Freq: Once | INTRAVENOUS | Status: DC
  Filled 2023-08-18 (×2): qty 50

## 2023-08-18 MED ORDER — ALBUTEROL SULFATE HFA 108 (90 BASE) MCG/ACT IN AERS: 2.0000 | INHALATION_SPRAY | Freq: Four times a day (QID) | RESPIRATORY_TRACT | Status: DC | PRN

## 2023-08-18 MED ORDER — LACTATED RINGERS IV SOLN: INTRAVENOUS | Status: DC

## 2023-08-18 MED ORDER — FENTANYL CITRATE (PF) 100 MCG/2ML IJ SOLN
25.0000 ug | INTRAMUSCULAR | Status: DC | PRN
Start: 2023-08-18 — End: 2023-08-18

## 2023-08-18 MED ORDER — ROCURONIUM BROMIDE 100 MG/10ML IV SOLN
INTRAVENOUS | Status: DC | PRN
  Administered 2023-08-18: 40 mg via INTRAVENOUS

## 2023-08-18 MED ORDER — LIDOCAINE HCL (CARDIAC) PF 100 MG/5ML IV SOSY
PREFILLED_SYRINGE | INTRAVENOUS | Status: DC | PRN
  Administered 2023-08-18: 60 mg via INTRAVENOUS

## 2023-08-18 MED ORDER — KETOROLAC TROMETHAMINE 30 MG/ML IJ SOLN
INTRAMUSCULAR | Status: DC | PRN
  Administered 2023-08-18: 30 mg via INTRAVENOUS

## 2023-08-18 MED ORDER — DEXAMETHASONE SODIUM PHOSPHATE 10 MG/ML IJ SOLN
INTRAMUSCULAR | Status: DC | PRN
  Administered 2023-08-18: 10 mg via INTRAVENOUS

## 2023-08-18 MED ORDER — ACETAMINOPHEN 650 MG RE SUPP: 650.0000 mg | Freq: Four times a day (QID) | RECTAL | Status: DC | PRN

## 2023-08-18 MED ORDER — MORPHINE SULFATE (PF) 4 MG/ML IV SOLN
4.0000 mg | Freq: Once | INTRAVENOUS | Status: DC
Start: 2023-08-18 — End: 2023-08-18
  Filled 2023-08-18: qty 1

## 2023-08-18 MED ORDER — BUPIVACAINE-EPINEPHRINE (PF) 0.5% -1:200000 IJ SOLN
INTRAMUSCULAR | Status: AC
  Filled 2023-08-18: qty 30

## 2023-08-18 MED ORDER — SUGAMMADEX SODIUM 200 MG/2ML IV SOLN
INTRAVENOUS | Status: DC | PRN
  Administered 2023-08-18: 120 mg via INTRAVENOUS

## 2023-08-18 MED ORDER — MIDAZOLAM HCL 2 MG/2ML IJ SOLN
INTRAMUSCULAR | Status: DC | PRN
  Administered 2023-08-18 (×2): 1 mg via INTRAVENOUS

## 2023-08-18 MED ORDER — LORATADINE 10 MG PO TABS
10.0000 mg | ORAL_TABLET | Freq: Every day | ORAL | Status: DC
  Administered 2023-08-19: 10 mg via ORAL
  Filled 2023-08-18: qty 1

## 2023-08-18 MED ORDER — IOHEXOL 300 MG/ML  SOLN
100.0000 mL | Freq: Once | INTRAMUSCULAR | Status: AC | PRN
  Administered 2023-08-18: 80 mL via INTRAVENOUS

## 2023-08-18 MED ORDER — PHENYLEPHRINE HCL-NACL 20-0.9 MG/250ML-% IV SOLN
INTRAVENOUS | Status: AC
  Filled 2023-08-18: qty 250

## 2023-08-18 MED ORDER — FENTANYL CITRATE (PF) 100 MCG/2ML IJ SOLN
INTRAMUSCULAR | Status: DC | PRN
  Administered 2023-08-18: 50 ug via INTRAVENOUS

## 2023-08-18 MED ORDER — ACETAMINOPHEN 10 MG/ML IV SOLN: 1000.0000 mg | Freq: Once | INTRAVENOUS | Status: DC | PRN

## 2023-08-18 MED ORDER — LISINOPRIL 10 MG PO TABS
10.0000 mg | ORAL_TABLET | Freq: Every day | ORAL | Status: DC
  Administered 2023-08-19: 10 mg via ORAL
  Filled 2023-08-18: qty 1

## 2023-08-18 MED ORDER — ACETAMINOPHEN 10 MG/ML IV SOLN
INTRAVENOUS | Status: AC
  Filled 2023-08-18: qty 100

## 2023-08-18 MED ORDER — PROPOFOL 10 MG/ML IV BOLUS
INTRAVENOUS | Status: DC | PRN
  Administered 2023-08-18: 120 mg via INTRAVENOUS

## 2023-08-18 MED ORDER — FENTANYL CITRATE (PF) 100 MCG/2ML IJ SOLN
INTRAMUSCULAR | Status: AC
  Filled 2023-08-18: qty 2

## 2023-08-18 MED ORDER — HYDROCODONE-ACETAMINOPHEN 5-325 MG PO TABS
1.0000 | ORAL_TABLET | ORAL | Status: DC | PRN
  Administered 2023-08-19 (×2): 1 via ORAL
  Filled 2023-08-18: qty 2
  Filled 2023-08-18: qty 1

## 2023-08-18 MED ORDER — ALBUTEROL SULFATE (2.5 MG/3ML) 0.083% IN NEBU: 2.5000 mg | INHALATION_SOLUTION | Freq: Four times a day (QID) | RESPIRATORY_TRACT | Status: DC | PRN

## 2023-08-18 MED ORDER — PREGABALIN 25 MG PO CAPS
25.0000 mg | ORAL_CAPSULE | Freq: Two times a day (BID) | ORAL | Status: DC
  Administered 2023-08-18 – 2023-08-19 (×2): 25 mg via ORAL
  Filled 2023-08-18 (×2): qty 1

## 2023-08-18 MED ORDER — FLUTICASONE FUROATE-VILANTEROL 100-25 MCG/ACT IN AEPB
1.0000 | INHALATION_SPRAY | Freq: Every day | RESPIRATORY_TRACT | Status: DC
  Administered 2023-08-18 – 2023-08-19 (×2): 1 via RESPIRATORY_TRACT
  Filled 2023-08-18 (×2): qty 28

## 2023-08-18 MED ORDER — IPRATROPIUM-ALBUTEROL 0.5-2.5 (3) MG/3ML IN SOLN: 3.0000 mL | Freq: Three times a day (TID) | RESPIRATORY_TRACT | Status: DC | PRN

## 2023-08-18 MED ORDER — PIPERACILLIN-TAZOBACTAM 3.375 G IVPB
3.3750 g | Freq: Three times a day (TID) | INTRAVENOUS | Status: DC
  Administered 2023-08-18 – 2023-08-19 (×4): 3.375 g via INTRAVENOUS
  Filled 2023-08-18 (×3): qty 50

## 2023-08-18 MED ORDER — BUPIVACAINE-EPINEPHRINE (PF) 0.5% -1:200000 IJ SOLN
INTRAMUSCULAR | Status: DC | PRN
  Administered 2023-08-18: 30 mL

## 2023-08-18 MED ORDER — SODIUM CHLORIDE 0.9 % IV BOLUS: 1000.0000 mL | Freq: Once | INTRAVENOUS | Status: DC

## 2023-08-18 MED ORDER — ONDANSETRON HCL 4 MG/2ML IJ SOLN
INTRAMUSCULAR | Status: DC | PRN
  Administered 2023-08-18: 4 mg via INTRAVENOUS

## 2023-08-18 MED ORDER — OXYCODONE HCL 5 MG/5ML PO SOLN: 5.0000 mg | Freq: Once | ORAL | Status: DC | PRN

## 2023-08-18 MED ORDER — UMECLIDINIUM BROMIDE 62.5 MCG/ACT IN AEPB
1.0000 | INHALATION_SPRAY | Freq: Every day | RESPIRATORY_TRACT | Status: DC
  Administered 2023-08-18 – 2023-08-19 (×2): 1 via RESPIRATORY_TRACT
  Filled 2023-08-18 (×2): qty 7

## 2023-08-18 MED ORDER — ONDANSETRON 4 MG PO TBDP
4.0000 mg | ORAL_TABLET | Freq: Four times a day (QID) | ORAL | Status: DC | PRN
  Filled 2023-08-18: qty 1

## 2023-08-18 MED ORDER — PHENYLEPHRINE HCL-NACL 20-0.9 MG/250ML-% IV SOLN
INTRAVENOUS | Status: DC | PRN
  Administered 2023-08-18: 80 ug via INTRAVENOUS

## 2023-08-18 MED ORDER — ENOXAPARIN SODIUM 40 MG/0.4ML IJ SOSY
40.0000 mg | PREFILLED_SYRINGE | INTRAMUSCULAR | Status: DC
  Administered 2023-08-19: 40 mg via SUBCUTANEOUS
  Filled 2023-08-18: qty 0.4

## 2023-08-18 MED ORDER — ACETAMINOPHEN 10 MG/ML IV SOLN
INTRAVENOUS | Status: DC | PRN
  Administered 2023-08-18: 1000 mg via INTRAVENOUS

## 2023-08-18 MED ORDER — PANTOPRAZOLE SODIUM 40 MG IV SOLR
40.0000 mg | Freq: Every day | INTRAVENOUS | Status: DC
  Administered 2023-08-18: 40 mg via INTRAVENOUS
  Filled 2023-08-18: qty 10

## 2023-08-18 MED ORDER — MONTELUKAST SODIUM 10 MG PO TABS
10.0000 mg | ORAL_TABLET | Freq: Every day | ORAL | Status: DC
  Administered 2023-08-18: 10 mg via ORAL
  Filled 2023-08-18: qty 1

## 2023-08-18 MED ORDER — ACETAMINOPHEN 325 MG PO TABS
650.0000 mg | ORAL_TABLET | Freq: Four times a day (QID) | ORAL | Status: DC | PRN
  Administered 2023-08-18: 650 mg via ORAL
  Filled 2023-08-18: qty 2

## 2023-08-18 MED ORDER — SODIUM CHLORIDE 0.9 % IV SOLN: INTRAVENOUS | Status: DC

## 2023-08-18 MED ORDER — BUPROPION HCL ER (XL) 150 MG PO TB24
300.0000 mg | ORAL_TABLET | Freq: Every day | ORAL | Status: DC
  Administered 2023-08-19: 300 mg via ORAL
  Filled 2023-08-18: qty 2

## 2023-08-18 MED ORDER — OXYCODONE HCL 5 MG PO TABS
5.0000 mg | ORAL_TABLET | Freq: Once | ORAL | Status: DC | PRN
Start: 2023-08-18 — End: 2023-08-18

## 2023-08-18 MED ORDER — LIDOCAINE HCL (PF) 2 % IJ SOLN
INTRAMUSCULAR | Status: AC
  Filled 2023-08-18: qty 5

## 2023-08-18 MED ORDER — PHENYLEPHRINE 80 MCG/ML (10ML) SYRINGE FOR IV PUSH (FOR BLOOD PRESSURE SUPPORT)
PREFILLED_SYRINGE | INTRAVENOUS | Status: AC
  Filled 2023-08-18: qty 10

## 2023-08-18 MED ORDER — ONDANSETRON HCL 4 MG/2ML IJ SOLN
4.0000 mg | Freq: Four times a day (QID) | INTRAMUSCULAR | Status: DC | PRN
  Administered 2023-08-18 – 2023-08-19 (×2): 4 mg via INTRAVENOUS
  Filled 2023-08-18: qty 2

## 2023-08-18 MED ORDER — KETOROLAC TROMETHAMINE 30 MG/ML IJ SOLN
INTRAMUSCULAR | Status: AC
  Filled 2023-08-18: qty 1

## 2023-08-18 MED ORDER — ONDANSETRON HCL 4 MG/2ML IJ SOLN
4.0000 mg | Freq: Once | INTRAMUSCULAR | Status: DC
  Filled 2023-08-18: qty 2

## 2023-08-18 MED ORDER — PROPOFOL 10 MG/ML IV BOLUS
INTRAVENOUS | Status: AC
  Filled 2023-08-18: qty 20

## 2023-08-18 MED ORDER — MIDAZOLAM HCL 2 MG/2ML IJ SOLN
INTRAMUSCULAR | Status: AC
Start: 2023-08-18 — End: ?
  Filled 2023-08-18: qty 2

## 2023-08-18 SURGICAL SUPPLY — 60 items
BAG PRESSURE INF REUSE 1000 (BAG) IMPLANT
BLADE SURG SZ11 CARB STEEL (BLADE) ×1 IMPLANT
CANNULA REDUCER 12-8 DVNC XI (CANNULA) ×1 IMPLANT
COVER TIP SHEARS 8 DVNC (MISCELLANEOUS) ×1 IMPLANT
DERMABOND ADVANCED .7 DNX12 (GAUZE/BANDAGES/DRESSINGS) ×1 IMPLANT
DRAPE ARM DVNC X/XI (DISPOSABLE) ×4 IMPLANT
DRAPE COLUMN DVNC XI (DISPOSABLE) ×1 IMPLANT
ELECT REM PT RETURN 9FT ADLT (ELECTROSURGICAL) ×1
ELECTRODE REM PT RTRN 9FT ADLT (ELECTROSURGICAL) ×1 IMPLANT
FORCEPS BPLR FENES DVNC XI (FORCEP) ×1 IMPLANT
GLOVE BIOGEL PI IND STRL 6.5 (GLOVE) ×2 IMPLANT
GLOVE SURG SYN 6.5 ES PF (GLOVE) ×2 IMPLANT
GLOVE SURG SYN 6.5 PF PI (GLOVE) ×2 IMPLANT
GOWN STRL REUS W/ TWL LRG LVL3 (GOWN DISPOSABLE) ×3 IMPLANT
GOWN STRL REUS W/TWL LRG LVL3 (GOWN DISPOSABLE) ×3
GRASPER SUT TROCAR 14GX15 (MISCELLANEOUS) IMPLANT
GRASPER TIP-UP FEN DVNC XI (INSTRUMENTS) ×1 IMPLANT
IRRIGATOR SUCT 8 DISP DVNC XI (IRRIGATION / IRRIGATOR) IMPLANT
IV NS 1000ML (IV SOLUTION)
IV NS 1000ML BAXH (IV SOLUTION) IMPLANT
KIT PINK PAD W/HEAD ARE REST (MISCELLANEOUS) ×1 IMPLANT
KIT PINK PAD W/HEAD ARM REST (MISCELLANEOUS) ×1 IMPLANT
LABEL OR SOLS (LABEL) IMPLANT
MANIFOLD NEPTUNE II (INSTRUMENTS) ×1 IMPLANT
NDL DRIVE SUT CUT DVNC (INSTRUMENTS) ×1 IMPLANT
NDL HYPO 22X1.5 SAFETY MO (MISCELLANEOUS) ×1 IMPLANT
NDL INSUFFLATION 14GA 120MM (NEEDLE) ×1 IMPLANT
NEEDLE DRIVE SUT CUT DVNC (INSTRUMENTS) ×1 IMPLANT
NEEDLE HYPO 22X1.5 SAFETY MO (MISCELLANEOUS) ×1 IMPLANT
NEEDLE INSUFFLATION 14GA 120MM (NEEDLE) ×1 IMPLANT
OBTURATOR OPTICAL STND 8 DVNC (TROCAR) ×1
OBTURATOR OPTICALSTD 8 DVNC (TROCAR) ×1 IMPLANT
PACK LAP CHOLECYSTECTOMY (MISCELLANEOUS) ×1 IMPLANT
RELOAD STAPLE 45 2.5 WHT DVNC (STAPLE) IMPLANT
RELOAD STAPLE 45 3.5 BLU DVNC (STAPLE) IMPLANT
RELOAD STAPLER 2.5X45 WHT DVNC (STAPLE) IMPLANT
RELOAD STAPLER 3.5X45 BLU DVNC (STAPLE) IMPLANT
SCISSORS MNPLR CVD DVNC XI (INSTRUMENTS) ×1 IMPLANT
SEAL UNIV 5-12 XI (MISCELLANEOUS) ×4 IMPLANT
SEALER VESSEL EXT DVNC XI (MISCELLANEOUS) IMPLANT
SET TUBE SMOKE EVAC HIGH FLOW (TUBING) ×1 IMPLANT
SOL ELECTROSURG ANTI STICK (MISCELLANEOUS) ×1
SOLUTION ELECTROSURG ANTI STCK (MISCELLANEOUS) ×1 IMPLANT
SPONGE T-LAP 4X18 ~~LOC~~+RFID (SPONGE) ×1 IMPLANT
STAPLER 45 SUREFORM DVNC (STAPLE) IMPLANT
STAPLER RELOAD 2.5X45 WHT DVNC (STAPLE)
STAPLER RELOAD 3.5X45 BLU DVNC (STAPLE)
SUT DVC VLOC 90 3-0 CV23 VLT (SUTURE) ×2
SUT MNCRL AB 4-0 PS2 18 (SUTURE) ×1 IMPLANT
SUT STRATA 3-0 23 RB-1.5 (SUTURE) ×1 IMPLANT
SUT VIC AB 2-0 SH 27 (SUTURE) ×2
SUT VIC AB 2-0 SH 27XBRD (SUTURE) ×1 IMPLANT
SUT VICRYL 0 UR6 27IN ABS (SUTURE) ×1 IMPLANT
SUTURE DVC VLC 90 3-0 CV23 VLT (SUTURE) IMPLANT
SYR 30ML LL (SYRINGE) ×1 IMPLANT
SYS BAG RETRIEVAL 10MM (BASKET) ×1
SYSTEM BAG RETRIEVAL 10MM (BASKET) ×1 IMPLANT
TRAP FLUID SMOKE EVACUATOR (MISCELLANEOUS) ×1 IMPLANT
TRAY FOLEY MTR SLVR 16FR STAT (SET/KITS/TRAYS/PACK) IMPLANT
WATER STERILE IRR 500ML POUR (IV SOLUTION) ×1 IMPLANT

## 2023-08-18 NOTE — Anesthesia Procedure Notes (Signed)
Procedure Name: Intubation Date/Time: 08/18/2023 9:27 AM  Performed by: Reece Agar, CRNAPre-anesthesia Checklist: Patient identified, Emergency Drugs available, Suction available and Patient being monitored Patient Re-evaluated:Patient Re-evaluated prior to induction Oxygen Delivery Method: Circle system utilized Preoxygenation: Pre-oxygenation with 100% oxygen Induction Type: IV induction Ventilation: Mask ventilation without difficulty Laryngoscope Size: McGrath and 3 Grade View: Grade I Tube type: Oral Tube size: 7.0 mm Number of attempts: 1 Airway Equipment and Method: Stylet Placement Confirmation: ETT inserted through vocal cords under direct vision, positive ETCO2 and breath sounds checked- equal and bilateral Secured at: 20 cm Tube secured with: Tape Dental Injury: Teeth and Oropharynx as per pre-operative assessment

## 2023-08-18 NOTE — Anesthesia Postprocedure Evaluation (Signed)
Anesthesia Post Note  Patient: Savannah Washington  Procedure(s) Performed: XI ROBOTIC LAPAROSCOPIC ASSISTED APPENDECTOMY  Patient location during evaluation: PACU Anesthesia Type: General Level of consciousness: awake and alert, oriented and patient cooperative Pain management: pain level controlled Vital Signs Assessment: post-procedure vital signs reviewed and stable Respiratory status: spontaneous breathing, nonlabored ventilation and respiratory function stable Cardiovascular status: blood pressure returned to baseline and stable Postop Assessment: adequate PO intake Anesthetic complications: no   No notable events documented.   Last Vitals:  Vitals:   08/18/23 1045 08/18/23 1100  BP: 132/65 135/80  Pulse: 96 94  Resp: (!) 24 18  Temp:    SpO2: 100% 96%    Last Pain:  Vitals:   08/18/23 1100  TempSrc:   PainSc: 0-No pain                 Reed Breech

## 2023-08-18 NOTE — Op Note (Signed)
Pre-op Diagnosis: Acute appendicitis   Post op Diagnosis: Acute appenditicis  Procedure: Robotic assisted laparoscopic appendectomy.  Anesthesia: GETA  Surgeon: Carolan Shiver, MD, FACS  Wound Classification: Contaminated  Specimen: Appendix  Complications: None  Estimated Blood Loss: 3 mL   Indications: Patient is a 62 y.o. female  presented with above right lower quadrant pain. CT scan shows acute appendicitis.     FIndings: 1.  Suppurative appendix with appendicolith 2. No peri-appendiceal abscess or phlegmon 3. Normal anatomy 4. Adequate hemostasis.        Description of procedure: The patient was placed on the operating table in the supine position. General anesthesia was induced. A time-out was completed verifying correct patient, procedure, site, positioning, and implant(s) and/or special equipment prior to beginning this procedure. The abdomen was prepped and draped in the usual sterile fashion.   Palmer's point located and Veress needle was inserted.  After confirming 2 clicks and a positive saline drop test, gas insufflation was initiated until the abdominal pressure was measured at 15 mmHg.  Afterwards, the Veress needle was removed and a 8 mm port was placed in left upper quadrant area using Optiview technique.  After local was infused, 3 additional incision on the left abdominal wall were made 5 cm apart.  An 12 mm port and two other 8 mm ports were placed under direct visualization.  No injuries from trocar placements were noted.  The table was placed in the Trendelenburg position with the right side elevated.  With the use of Tip up grasper, fenestrated bipolar and monopolar scissors, an inflamed appendix was identified and elevated.  Window created at base of appendix in the mesentery.    The mesoappendix was divided with combination of bipolar energy and monopolar scissors.  The base of the appendix was ligated with 3-0 V-Loc.  The appendix was divided with  monopolar scissors.  A second layer of the 3 oh V-Loc was done over the appendiceal stump.  The appendiceal stump was examined and hemostasis noted. No other pathology was identified within pelvis. The 12 mm trocar removed and port site closed with PMI using 0 vicryl under direct vision. Remaining trocars were removed under direct vision. No bleeding was noted.The abdomen was allowed to collapse.  All skin incisions then closed with subcuticular sutures Monocryl 4-0.  Wounds then dressed with dermabond.  The patient tolerated the procedure well, awakened from anesthesia and was taken to the postanesthesia care unit in satisfactory condition.  Sponge count and instrument count correct at the end of the procedure.

## 2023-08-18 NOTE — ED Notes (Signed)
Pt de-satted to 85% on RA after administering pain medication, Pt placed on 2L [place] and sat improved to 95%. Will continue to monitor

## 2023-08-18 NOTE — ED Notes (Addendum)
Report given to pre op

## 2023-08-18 NOTE — Anesthesia Preprocedure Evaluation (Addendum)
Anesthesia Evaluation  Patient identified by MRN, date of birth, ID band Patient awake    Reviewed: Allergy & Precautions, NPO status , Patient's Chart, lab work & pertinent test results  History of Anesthesia Complications Negative for: history of anesthetic complications  Airway Mallampati: IV   Neck ROM: Full    Dental  (+) Missing, Chipped   Pulmonary COPD, Current Smoker (1 ppd) and Patient abstained from smoking.   Pulmonary exam normal breath sounds clear to auscultation       Cardiovascular hypertension, Normal cardiovascular exam Rhythm:Regular Rate:Normal  ECG 08/17/23:  Normal sinus rhythm Nonspecific T wave abnormality Prolonged QT Abnormal ECG When compared with ECG of 20-Aug-2019 07:24, No significant change was found   Neuro/Psych  PSYCHIATRIC DISORDERS Anxiety Depression     Neuromuscular disease (polyneuropathy)    GI/Hepatic negative GI ROS,,,  Endo/Other  Prediabetes   Renal/GU negative Renal ROS     Musculoskeletal   Abdominal   Peds  Hematology negative hematology ROS (+)   Anesthesia Other Findings   Reproductive/Obstetrics                             Anesthesia Physical Anesthesia Plan  ASA: 2  Anesthesia Plan: General   Post-op Pain Management:    Induction: Intravenous  PONV Risk Score and Plan: 2 and Ondansetron, Dexamethasone and Treatment may vary due to age or medical condition  Airway Management Planned: Oral ETT  Additional Equipment:   Intra-op Plan:   Post-operative Plan: Extubation in OR  Informed Consent: I have reviewed the patients History and Physical, chart, labs and discussed the procedure including the risks, benefits and alternatives for the proposed anesthesia with the patient or authorized representative who has indicated his/her understanding and acceptance.     Dental advisory given  Plan Discussed with:  CRNA  Anesthesia Plan Comments: (Patient consented for risks of anesthesia including but not limited to:  - adverse reactions to medications - damage to eyes, teeth, lips or other oral mucosa - nerve damage due to positioning  - sore throat or hoarseness - damage to heart, brain, nerves, lungs, other parts of body or loss of life  Informed patient about role of CRNA in peri- and intra-operative care.  Patient voiced understanding.)        Anesthesia Quick Evaluation

## 2023-08-18 NOTE — Transfer of Care (Signed)
Immediate Anesthesia Transfer of Care Note  Patient: Savannah Washington  Procedure(s) Performed: XI ROBOTIC LAPAROSCOPIC ASSISTED APPENDECTOMY  Patient Location: PACU  Anesthesia Type:General  Level of Consciousness: awake, drowsy, and patient cooperative  Airway & Oxygen Therapy: Patient Spontanous Breathing and Patient connected to face mask oxygen  Post-op Assessment: Report given to RN and Post -op Vital signs reviewed and stable  Post vital signs: Reviewed and stable  Last Vitals:  Vitals Value Taken Time  BP 119/80 08/18/23 1031  Temp    Pulse 101 08/18/23 1035  Resp 33 08/18/23 1035  SpO2 100 % 08/18/23 1035  Vitals shown include unfiled device data.  Last Pain:  Vitals:   08/18/23 0853  TempSrc:   PainSc: 3          Complications: No notable events documented.

## 2023-08-18 NOTE — ED Provider Notes (Signed)
[facility] Provider Note    Event Date/Time   First MD Initiated Contact with Patient 08/18/23 0530     (approximate)   History   Abdominal Pain   HPI  Savannah Washington is a 62 y.o. female brought to the ED via EMS from home with a chief complaint of generalized abdominal pain, N/V/D which began last night.  Denies fever/chills, chest pain, shortness of breath or dysuria.     Past Medical History   Past Medical History:  Diagnosis Date   Anxiety    Aortic atherosclerosis (HCC) 12/29/2017   Chest CT March 2019   Cervical arthritis 09/04/2017   xrays Dec 2018   Degenerative disc disease, cervical 09/04/2017   Xrays Dec 2018   Depression    Emphysema lung (HCC) 12/29/2017   Chest CT March 2019   Hepatic steatosis 12/29/2017   Chest CT March 2019   Hyperlipidemia    Hypertension    Prediabetes 03/14/2017     Active Problem List   Patient Active Problem List   Diagnosis Date Noted   Osteopenia 06/04/2022   Current mild episode of major depressive disorder, unspecified whether recurrent (HCC) 02/12/2021   Type 2 diabetes mellitus with diabetic polyneuropathy, without long-term current use of insulin (HCC) 02/04/2020   Tobacco abuse 12/01/2018   Aortic atherosclerosis (HCC) 12/29/2017   Hepatic steatosis 12/29/2017   COPD (chronic obstructive pulmonary disease) (HCC) 12/05/2017   Degenerative disc disease, cervical 09/04/2017   Cervical arthritis 09/04/2017   Muscle spasm of left shoulder area 09/03/2017   Elevated platelet count 04/11/2017   Marijuana use 03/17/2017   Prediabetes 03/14/2017   Essential hypertension, benign 03/14/2017   Hyperlipidemia 03/14/2017   Anxiety disorder 03/14/2017     Past Surgical History   Past Surgical History:  Procedure Laterality Date   BREAST BIOPSY Left 07/18/2022   stereo bx, calcs, COIL clip-path pending     Home Medications   Prior to Admission medications   Medication Sig Start Date End Date  Taking? Authorizing Provider  albuterol (PROAIR HFA) 108 (90 Base) MCG/ACT inhaler Inhale 2 puffs into the lungs every 6 (six) hours as needed for wheezing or shortness of breath. 06/19/22   Danelle Berry, PA-C  atorvastatin (LIPITOR) 80 MG tablet Take 1 tablet (80 mg total) by mouth at bedtime. 05/29/23   Danelle Berry, PA-C  Baclofen 5 MG TABS Take 1-2 tablets (5-10 mg total) by mouth 3 (three) times daily as needed (msk pain or spasms). 05/27/23   Danelle Berry, PA-C  buPROPion (WELLBUTRIN XL) 300 MG 24 hr tablet Take 1 tablet (300 mg total) by mouth daily. 05/27/23   Danelle Berry, PA-C  cetirizine (ZYRTEC) 10 MG tablet Take 10 mg by mouth daily.    [provider]  Fluticasone-Umeclidin-Vilant (TRELEGY ELLIPTA) 100-62.5-25 MCG/ACT AEPB Inhale 1 each into the lungs daily. 01/14/23   Danelle Berry, PA-C  ipratropium-albuterol (DUONEB) 0.5-2.5 (3) MG/3ML SOLN Take 3 mLs by nebulization every 8 (eight) hours as needed. 10/06/19   Danelle Berry, PA-C  lisinopril (ZESTRIL) 10 MG tablet Take 1 tablet (10 mg total) by mouth daily. 05/29/23   Danelle Berry, PA-C  meloxicam (MOBIC) 15 MG tablet Take 0.5-1 tablets (7.5-15 mg total) by mouth daily as needed for pain (severe pain). 06/12/23   Danelle Berry, PA-C  montelukast (SINGULAIR) 10 MG tablet Take 1 tablet (10 mg total) by mouth at bedtime. 01/22/21   Danelle Berry, PA-C  Multiple Vitamins-Minerals (MULTI COMPLETE PO) Take 1,000  Units by mouth daily.    [provider]  nystatin (MYCOSTATIN/NYSTOP) powder APPLY  POWDER TOPICALLY THREE TIMES DAILY TO AFFECTED AREA AS NEEDED 08/01/22   Danelle Berry, PA-C  predniSONE (DELTASONE) 20 MG tablet 2 tabs poqday 1-3, 1 tabs poqday 4-6 05/27/23   Danelle Berry, PA-C  pregabalin (LYRICA) 25 MG capsule Take 1 capsule (25 mg total) by mouth 2 (two) times daily. For neck or musculoskeletal pain 12/24/22   Danelle Berry, PA-C  Turmeric 500 MG CAPS Take 500 mg by mouth daily.    [provider]     Allergies   Patient has no known allergies.   Family History   Family History  Problem Relation Age of Onset   Dementia Mother    Breast cancer Sister 43   Aneurysm Father    Heart attack Brother    Heart defect Brother    Brain cancer Daughter    Heart attack Paternal Grandfather    Drug abuse Sister      Physical Exam  Triage Vital Signs: ED Triage Vitals  Encounter Vitals Group     BP 08/17/23 2344 (!) 155/90     Systolic BP Percentile --      Diastolic BP Percentile --      Pulse Rate 08/17/23 2344 84     Resp 08/17/23 2344 16     Temp 08/17/23 2344 97.6 F (36.4 C)     Temp Source 08/17/23 2344 Oral     SpO2 08/17/23 2344 96 %     Weight --      Height --      Head Circumference --      Peak Flow --      Pain Score 08/17/23 2338 8     Pain Loc --      Pain Education --      Exclude from Growth Chart --     Updated Vital Signs: BP (!) 163/89 (BP Location: Left Arm)   Pulse 90   Temp 97.9 F (36.6 C) (Oral)   Resp 18   SpO2 93%    General: Awake, mild distress.  Mildly dry mucous membranes. CV:  RRR.  Good peripheral perfusion.  Resp:  Normal effort.  CTAB. Abd:  Mild diffuse tenderness to palpation, maximally right lower quadrant without rebound or guarding.  No distention.  Other:  No truncal vesicles.   ED Results / Procedures / Treatments  Labs (all labs ordered are listed, but only abnormal results are displayed) Labs Reviewed  COMPREHENSIVE METABOLIC PANEL - Abnormal; Notable for the following components:      Result Value   Glucose, Bld 154 (*)    All other components within normal limits  CBC - Abnormal; Notable for the following components:   WBC 17.9 (*)    Platelets 433 (*)    All other components within normal limits  URINALYSIS, ROUTINE W REFLEX MICROSCOPIC - Abnormal; Notable for the following components:   Color, Urine YELLOW (*)    APPearance HAZY (*)    Ketones, ur 80 (*)    Leukocytes,Ua MODERATE (*)    Bacteria, UA RARE (*)    All  other components within normal limits  LIPASE, BLOOD  TROPONIN I (HIGH SENSITIVITY)  TROPONIN I (HIGH SENSITIVITY)     EKG  ED ECG REPORT I, Jaydis Duchene J, the attending physician, personally viewed and interpreted this ECG.   Date: 08/18/2023  EKG Time: 2339  Rate: 90  Rhythm: normal sinus rhythm  Axis: Normal  Intervals:none  ST&T Change: Nonspecific    RADIOLOGY I have independently visualized and interpreted patient's imaging study as well as noted the radiology interpretation:  CT abdomen/pelvis: Acute appendicitis without perforation or abscess  Official radiology report(s): CT ABDOMEN PELVIS W CONTRAST  Result Date: 08/18/2023 CLINICAL DATA:  Acute, nonlocalized abdominal pain. Generalized abdominal pain radiating to the back symptoms for 3 hours. EXAM: CT ABDOMEN AND PELVIS WITH CONTRAST TECHNIQUE: Multidetector CT imaging of the abdomen and pelvis was performed using the standard protocol following bolus administration of intravenous contrast. RADIATION DOSE REDUCTION: This exam was performed according to the departmental dose-optimization program which includes automated exposure control, adjustment of the mA and/or kV according to patient size and/or use of iterative reconstruction technique. CONTRAST:  80mL OMNIPAQUE IOHEXOL 300 MG/ML  SOLN COMPARISON:  11/25/2011 FINDINGS: Lower chest:  No contributory findings. Hepatobiliary: No focal liver abnormality.No evidence of biliary obstruction or stone. Pancreas: Unremarkable. Spleen: Unremarkable. Adrenals/Urinary Tract: Negative adrenals. No hydronephrosis or stone. Unremarkable bladder. Stomach/Bowel: Dilated and fluid-filled appendix extending inferior from the cecum and into the pelvis, up to 12 mm in outer wall diameter. There is mesoappendiceal fat stranding. An 8 mm stone is present at the base of the appendix. No perforation. Sigmoid diverticulosis Vascular/Lymphatic: Scattered atheromatous calcification of the aorta and  branch vessels. No acute vascular finding. No mass or adenopathy. Reproductive:No pathologic findings. Other: No ascites or pneumoperitoneum. Musculoskeletal: No acute abnormalities. Lumbar spine degeneration with L4-5 anterolisthesis and L5-S1 disc collapse. Foraminal narrowing on the right at these levels. IMPRESSION: Acute, suppurative appendicitis with appendicolith.  No abscess Electronically Signed   By: Tiburcio Pea M.D.   On: 08/18/2023 04:40     PROCEDURES:  Critical Care performed: No  .1-3 Lead EKG Interpretation  Performed by: Irean Hong, MD Authorized by: Irean Hong, MD     Interpretation: normal     ECG rate:  90   ECG rate assessment: normal     Rhythm: sinus rhythm     Ectopy: none     Conduction: normal   Comments:     Patient placed on cardiac monitor to evaluate for arrhythmias    MEDICATIONS ORDERED IN ED: Medications  sodium chloride 0.9 % bolus 1,000 mL (has no administration in time range)  ondansetron (ZOFRAN) injection 4 mg (has no administration in time range)  morphine (PF) 4 MG/ML injection 4 mg (has no administration in time range)  piperacillin-tazobactam (ZOSYN) IVPB 3.375 g (has no administration in time range)  iohexol (OMNIPAQUE) 300 MG/ML solution 100 mL (80 mLs Intravenous Contrast Given 08/18/23 0406)     IMPRESSION / MDM / ASSESSMENT AND PLAN / ED COURSE  I reviewed the triage vital signs and the nursing notes.                             62 year old female presenting with abdominal pain, nausea/vomiting/diarrhea. Differential diagnosis includes, but is not limited to, ovarian cyst, ovarian torsion, acute appendicitis, diverticulitis, urinary tract infection/pyelonephritis, endometriosis, bowel obstruction, colitis, renal colic, gastroenteritis, hernia, fibroids, etc. I have personally reviewed patient's records and note a phys med office visit on 08/13/2023 for lumbar radiculitis.  Patient's presentation is most consistent with acute  presentation with potential threat to life or bodily function.  The patient is on the cardiac monitor to evaluate for evidence of arrhythmia and/or significant heart rate changes.  Laboratory results remarkable for leukocytosis with WBC nearly 18,000.  Unremarkable electrolytes and troponin.  CT positive for acute appendicitis.  Will discuss with general surgery.  Administer IV fluid hydration, IV Zosyn, Morphine for pain, Zofran for nausea.  Clinical Course as of 08/18/23 0539  Mon Aug 18, 2023  1610 Discussed case with Dr. Aleen Campi who will evaluate patient for admission. [JS]    Clinical Course User Index [JS] Irean Hong, MD     FINAL CLINICAL IMPRESSION(S) / ED DIAGNOSES   Final diagnoses:  Abdominal pain, unspecified abdominal location  Nausea vomiting and diarrhea  Acute appendicitis, unspecified acute appendicitis type     Rx / DC Orders   ED Discharge Orders     None        Note:  This document was prepared using Dragon voice recognition software and may include unintentional dictation errors.   Irean Hong, MD 08/18/23 [phone]

## 2023-08-18 NOTE — H&P (Signed)
SURGICAL CONSULTATION NOTE   HISTORY OF PRESENT ILLNESS (HPI):  62 y.o. female presented to [facility] ED for evaluation of abdominal pain. Patient reports she started having abdominal pain since yesterday night.  Pain initially was generalized and now started radiating to the back and right lower quadrant.  Patient cannot identify any alleviating or aggravating factors.  She denies any fever.  Patient endorses associated nausea and vomiting.  At the ED she was found with tenderness on palpation in the right lower quadrant.  Vital signs without fever.  No tachycardia.  She was found with leukocytosis of 17.9.  No significant electrolyte disturbance.  She was also evaluated with CT scan of the abdomen and pelvis that shows dilated appendix with associated fat stranding and appendicolith consistent with appendicitis.  I personally evaluated the images.  No sign of perforation.  Surgery is consulted by Dr. Dolores Frame in this context for evaluation and management of acute appendicitis.  PAST MEDICAL HISTORY (PMH):  Past Medical History:  Diagnosis Date   Anxiety    Aortic atherosclerosis (HCC) 12/29/2017   Chest CT March 2019   Cervical arthritis 09/04/2017   xrays Dec 2018   Degenerative disc disease, cervical 09/04/2017   Xrays Dec 2018   Depression    Emphysema lung (HCC) 12/29/2017   Chest CT March 2019   Hepatic steatosis 12/29/2017   Chest CT March 2019   Hyperlipidemia    Hypertension    Prediabetes 03/14/2017     PAST SURGICAL HISTORY (PSH):  Past Surgical History:  Procedure Laterality Date   BREAST BIOPSY Left 07/18/2022   stereo bx, calcs, COIL clip-path pending     MEDICATIONS:  Prior to Admission medications   Medication Sig Start Date End Date Taking? Authorizing Provider  albuterol (PROAIR HFA) 108 (90 Base) MCG/ACT inhaler Inhale 2 puffs into the lungs every 6 (six) hours as needed for wheezing or shortness of breath. 06/19/22  Yes Danelle Berry, PA-C  atorvastatin (LIPITOR) 80 MG  tablet Take 1 tablet (80 mg total) by mouth at bedtime. 05/29/23  Yes Danelle Berry, PA-C  Baclofen 5 MG TABS Take 1-2 tablets (5-10 mg total) by mouth 3 (three) times daily as needed (msk pain or spasms). 05/27/23  Yes Danelle Berry, PA-C  buPROPion (WELLBUTRIN XL) 300 MG 24 hr tablet Take 1 tablet (300 mg total) by mouth daily. 05/27/23  Yes Danelle Berry, PA-C  cetirizine (ZYRTEC) 10 MG tablet Take 10 mg by mouth daily.   Yes [provider]  Fluticasone-Umeclidin-Vilant (TRELEGY ELLIPTA) 100-62.5-25 MCG/ACT AEPB Inhale 1 each into the lungs daily. 01/14/23  Yes Danelle Berry, PA-C  ipratropium-albuterol (DUONEB) 0.5-2.5 (3) MG/3ML SOLN Take 3 mLs by nebulization every 8 (eight) hours as needed. 10/06/19  Yes Danelle Berry, PA-C  lisinopril (ZESTRIL) 10 MG tablet Take 1 tablet (10 mg total) by mouth daily. 05/29/23  Yes Danelle Berry, PA-C  meloxicam (MOBIC) 15 MG tablet Take 0.5-1 tablets (7.5-15 mg total) by mouth daily as needed for pain (severe pain). 06/12/23  Yes Danelle Berry, PA-C  montelukast (SINGULAIR) 10 MG tablet Take 1 tablet (10 mg total) by mouth at bedtime. 01/22/21  Yes Danelle Berry, PA-C  Multiple Vitamins-Minerals (MULTI COMPLETE PO) Take 1,000 Units by mouth daily.   Yes [provider]  nystatin (MYCOSTATIN/NYSTOP) powder APPLY  POWDER TOPICALLY THREE TIMES DAILY TO AFFECTED AREA AS NEEDED 08/01/22  Yes Danelle Berry, PA-C  pregabalin (LYRICA) 25 MG capsule Take 1 capsule (25 mg total) by mouth 2 (two) times daily.  For neck or musculoskeletal pain 12/24/22  Yes Danelle Berry, PA-C  Turmeric 500 MG CAPS Take 500 mg by mouth daily.   Yes [provider]  predniSONE (DELTASONE) 20 MG tablet 2 tabs poqday 1-3, 1 tabs poqday 4-6 Patient not taking: Reported on 08/18/2023 05/27/23   Danelle Berry, PA-C     ALLERGIES:  No Known Allergies   SOCIAL HISTORY:  Social History   Socioeconomic History   Marital status: Widowed    Spouse name: Not on file   Number of  children: Not on file   Years of education: Not on file   Highest education level: Not on file  Occupational History   Not on file  Tobacco Use   Smoking status: Every Day    Current packs/day: 0.50    Average packs/day: 0.5 packs/day for 44.3 years (22.1 ttl pk-yrs)    Types: Cigarettes    Start date: 08/30/2018   Smokeless tobacco: Never  Vaping Use   Vaping status: Former  Substance and Sexual Activity   Alcohol use: Yes    Alcohol/week: 5.0 standard drinks of alcohol    Types: 1 Glasses of wine, 4 Cans of beer per week   Drug use: No   Sexual activity: Not Currently    Birth control/protection: Post-menopausal  Other Topics Concern   Not on file  Social History Narrative   Not on file   Social Determinants of Health   Financial Resource Strain: Low Risk  (02/22/2022)   Overall Financial Resource Strain (CARDIA)    Difficulty of Paying Living Expenses: Not hard at all  Food Insecurity: No Food Insecurity (02/22/2022)   Hunger Vital Sign    Worried About Running Out of Food in the Last Year: Never true    Ran Out of Food in the Last Year: Never true  Transportation Needs: No Transportation Needs (02/22/2022)   PRAPARE - Administrator, Civil Service (Medical): No    Lack of Transportation (Non-Medical): No  Physical Activity: Inactive (02/22/2022)   Exercise Vital Sign    Days of Exercise per Week: 0 days    Minutes of Exercise per Session: 0 min  Stress: No Stress Concern Present (02/22/2022)   [employer] of Occupational Health - Occupational Stress Questionnaire    Feeling of Stress : Only a little  Social Connections: Socially Isolated (02/22/2022)   Social Connection and Isolation Panel [NHANES]    Frequency of Communication with Friends and Family: Twice a week    Frequency of Social Gatherings with Friends and Family: Twice a week    Attends Religious Services: Never    Database administrator or Organizations: No    Attends Banker  Meetings: Never    Marital Status: Widowed  Intimate Partner Violence: Not At Risk (02/22/2022)   Humiliation, Afraid, Rape, and Kick questionnaire    Fear of Current or Ex-Partner: No    Emotionally Abused: No    Physically Abused: No    Sexually Abused: No      FAMILY HISTORY:  Family History  Problem Relation Age of Onset   Dementia Mother    Breast cancer Sister 89   Aneurysm Father    Heart attack Brother    Heart defect Brother    Brain cancer Daughter    Heart attack Paternal Grandfather    Drug abuse Sister      REVIEW OF SYSTEMS:  Constitutional: denies weight loss, fever, chills, or sweats  Eyes: denies any  other vision changes, history of eye injury  ENT: denies sore throat, hearing problems  Respiratory: denies shortness of breath, wheezing  Cardiovascular: denies chest pain, palpitations  Gastrointestinal: positive abdominal pain, nausea and vomiting Genitourinary: denies burning with urination or urinary frequency Musculoskeletal: denies any other joint pains or cramps  Skin: denies any other rashes or skin discolorations  Neurological: denies any other headache, dizziness, weakness  Psychiatric: denies any other depression, anxiety   All other review of systems were negative   VITAL SIGNS:  Temp:  [97.6 F (36.4 C)-97.9 F (36.6 C)] 97.9 F (36.6 C) (11/18 0252) Pulse Rate:  [84-90] 90 (11/18 0252) Resp:  [16-18] 18 (11/18 0252) BP: (155-163)/(89-90) 163/89 (11/18 0252) SpO2:  [93 %-96 %] 95 % (11/18 0600) Weight:  [59.4 kg] 59.4 kg (11/18 0600)       Weight: 59.4 kg (From O/V note from 08/13/23)     INTAKE/OUTPUT:  This shift: No intake/output data recorded.  Last 2 shifts: @IOLAST2SHIFTS @   PHYSICAL EXAM:  Constitutional:  -- Normal body habitus  -- Awake, alert, and oriented x3  Eyes:  -- Pupils equally round and reactive to light  -- No scleral icterus  Ear, nose, and throat:  -- No jugular venous distension  Pulmonary:  -- No crackles   -- Equal breath sounds bilaterally -- Breathing non-labored at rest Cardiovascular:  -- S1, S2 present  -- No pericardial rubs Gastrointestinal:  -- Abdomen soft, tender to palpation in the right lower quadrant, non-distended, no guarding or rebound tenderness -- No abdominal masses appreciated, pulsatile or otherwise  Musculoskeletal and Integumentary:  -- Wounds or skin discoloration: None appreciated -- Extremities: B/L UE and LE FROM, hands and feet warm, no edema  Neurologic:  -- Motor function: intact and symmetric -- Sensation: intact and symmetric   Labs:     Latest Ref Rng & Units 08/17/2023   11:40 PM 05/27/2023    2:46 PM 12/24/2022   11:22 AM  CBC  WBC 4.0 - 10.5 K/uL 17.9  7.1  14.1   Hemoglobin 12.0 - 15.0 g/dL 14.7  82.9  56.2   Hematocrit 36.0 - 46.0 % 43.6  41.0  47.8   Platelets 150 - 400 K/uL 433  411  520       Latest Ref Rng & Units 08/17/2023   11:40 PM 06/24/2023    2:15 PM 05/27/2023    2:46 PM  CMP  Glucose 70 - 99 mg/dL 130  91  865   BUN 8 - 23 mg/dL 16  14  12    Creatinine 0.44 - 1.00 mg/dL 7.84  6.96  2.95   Sodium 135 - 145 mmol/L 136  138  139   Potassium 3.5 - 5.1 mmol/L 3.7  4.3  4.2   Chloride 98 - 111 mmol/L 99  101  101   CO2 22 - 32 mmol/L 22  28  30    Calcium 8.9 - 10.3 mg/dL 9.0  9.2  9.2   Total Protein 6.5 - 8.1 g/dL 6.9   6.0   Total Bilirubin <1.2 mg/dL 0.7   0.3   Alkaline Phos 38 - 126 U/L 60     AST 15 - 41 U/L 30   29   ALT 0 - 44 U/L 18   15     Imaging studies:  EXAM: CT ABDOMEN AND PELVIS WITH CONTRAST   TECHNIQUE: Multidetector CT imaging of the abdomen and pelvis was performed using the standard protocol following bolus  administration of intravenous contrast.   RADIATION DOSE REDUCTION: This exam was performed according to the departmental dose-optimization program which includes automated exposure control, adjustment of the mA and/or kV according to patient size and/or use of iterative reconstruction  technique.   CONTRAST:  80mL OMNIPAQUE IOHEXOL 300 MG/ML  SOLN   COMPARISON:  11/25/2011   FINDINGS: Lower chest:  No contributory findings.   Hepatobiliary: No focal liver abnormality.No evidence of biliary obstruction or stone.   Pancreas: Unremarkable.   Spleen: Unremarkable.   Adrenals/Urinary Tract: Negative adrenals. No hydronephrosis or stone. Unremarkable bladder.   Stomach/Bowel: Dilated and fluid-filled appendix extending inferior from the cecum and into the pelvis, up to 12 mm in outer wall diameter. There is mesoappendiceal fat stranding. An 8 mm stone is present at the base of the appendix. No perforation. Sigmoid diverticulosis   Vascular/Lymphatic: Scattered atheromatous calcification of the aorta and branch vessels. No acute vascular finding. No mass or adenopathy.   Reproductive:No pathologic findings.   Other: No ascites or pneumoperitoneum.   Musculoskeletal: No acute abnormalities. Lumbar spine degeneration with L4-5 anterolisthesis and L5-S1 disc collapse. Foraminal narrowing on the right at these levels.   IMPRESSION: Acute, suppurative appendicitis with appendicolith.  No abscess     Electronically Signed   By: Tiburcio Pea M.D.   On: 08/18/2023 04:40  Assessment/Plan:  62 y.o. female with acute appendicitis, complicated by pertinent comorbidities including emphysema, hyperlipidemia, hypertension, anxiety.  Patient with history, physical exam and images consistent with acute appendicitis. Patient oriented about diagnosis and surgical management as treatment. Patient oriented about goals of surgery and its risk including: bowel injury, infection, abscess, bleeding, leak from cecum, intestinal adhesions, bowel obstruction, fistula, injury to the ureter among others.  Patient understood and agreed to proceed with surgery. Will admit patient, already started on antibiotic therapy, will give IV hydration since patient is NPO and schedule to OR.    Gae Gallop, MD

## 2023-08-19 ENCOUNTER — Encounter: Payer: Self-pay | Admitting: General Surgery

## 2023-08-19 LAB — SURGICAL PATHOLOGY

## 2023-08-19 MED ORDER — HYDROCODONE-ACETAMINOPHEN 5-325 MG PO TABS: 1.0000 | ORAL_TABLET | ORAL | 0 refills | Status: AC | PRN

## 2023-08-19 MED ORDER — ONDANSETRON HCL 4 MG PO TABS: 4.0000 mg | ORAL_TABLET | Freq: Three times a day (TID) | ORAL | 0 refills | Status: AC | PRN

## 2023-08-19 NOTE — Discharge Instructions (Signed)

## 2023-08-19 NOTE — Plan of Care (Signed)

## 2023-08-19 NOTE — Plan of Care (Signed)
  Problem: Pain Management: Goal: General experience of comfort will improve Outcome: Progressing   Problem: Safety: Goal: Ability to remain free from injury will improve Outcome: Progressing

## 2023-08-19 NOTE — Discharge Summary (Signed)
  Patient ID: Savannah Washington MRN: [id number] DOB/AGE: October 21, 1960 62 y.o.  Admit date: 08/18/2023 Discharge date: 08/19/2023   Discharge Diagnoses:  Principal Problem:   Acute appendicitis with localized peritonitis   Procedures: Robotic assisted laparoscopic appendectomy  Hospital Course: Patient admitted with acute appendicitis.  She underwent robotic appendectomy.  She has been recovering well.  The pain is controlled.  The patient tolerating diet.  Patient ambulating.  No complication from the incisions.  Physical Exam Vitals reviewed.  HENT:     Head: Normocephalic.  Abdominal:     General: Abdomen is flat. Bowel sounds are normal.     Palpations: Abdomen is soft.     Tenderness: There is no abdominal tenderness.     Hernia: No hernia is present.  Skin:    General: Skin is warm.     Capillary Refill: Capillary refill takes less than 2 seconds.  Neurological:     Mental Status: She is alert and oriented to person, place, and time.      Consults: None  Disposition: Discharge disposition: 01-Home or Self Care       Discharge Instructions     Diet - low sodium heart healthy   Complete by: As directed    Increase activity slowly   Complete by: As directed       Allergies as of 08/19/2023   No Known Allergies      Medication List     TAKE these medications    albuterol 108 (90 Base) MCG/ACT inhaler Commonly known as: ProAir HFA Inhale 2 puffs into the lungs every 6 (six) hours as needed for wheezing or shortness of breath.   atorvastatin 80 MG tablet Commonly known as: LIPITOR Take 1 tablet (80 mg total) by mouth at bedtime.   Baclofen 5 MG Tabs Take 1-2 tablets (5-10 mg total) by mouth 3 (three) times daily as needed (msk pain or spasms).   buPROPion 300 MG 24 hr tablet Commonly known as: WELLBUTRIN XL Take 1 tablet (300 mg total) by mouth daily.   cetirizine 10 MG tablet Commonly known as: ZYRTEC Take 10 mg by mouth daily.    Fluticasone-Umeclidin-Vilant 100-62.5-25 MCG/ACT Aepb Commonly known as: Trelegy Ellipta Inhale 1 each into the lungs daily.   HYDROcodone-acetaminophen 5-325 MG tablet Commonly known as: Norco Take 1 tablet by mouth every 4 (four) hours as needed for up to 3 days for moderate pain (pain score 4-6).   ipratropium-albuterol 0.5-2.5 (3) MG/3ML Soln Commonly known as: DUONEB Take 3 mLs by nebulization every 8 (eight) hours as needed.   lisinopril 10 MG tablet Commonly known as: ZESTRIL Take 1 tablet (10 mg total) by mouth daily.   meloxicam 15 MG tablet Commonly known as: MOBIC Take 0.5-1 tablets (7.5-15 mg total) by mouth daily as needed for pain (severe pain).   montelukast 10 MG tablet Commonly known as: SINGULAIR Take 1 tablet (10 mg total) by mouth at bedtime.   MULTI COMPLETE PO Take 1,000 Units by mouth daily.   nystatin powder Commonly known as: MYCOSTATIN/NYSTOP APPLY  POWDER TOPICALLY THREE TIMES DAILY TO AFFECTED AREA AS NEEDED   predniSONE 20 MG tablet Commonly known as: DELTASONE 2 tabs poqday 1-3, 1 tabs poqday 4-6   pregabalin 25 MG capsule Commonly known as: LYRICA Take 1 capsule (25 mg total) by mouth 2 (two) times daily. For neck or musculoskeletal pain   Turmeric 500 MG Caps Take 500 mg by mouth daily.

## 2023-08-22 ENCOUNTER — Encounter: Payer: Self-pay | Admitting: General Surgery

## 2023-08-26 ENCOUNTER — Ambulatory Visit: Payer: 59 | Admitting: Physician Assistant

## 2023-08-27 ENCOUNTER — Encounter: Payer: Self-pay | Admitting: Physician Assistant

## 2023-08-27 ENCOUNTER — Ambulatory Visit (INDEPENDENT_AMBULATORY_CARE_PROVIDER_SITE_OTHER): Payer: Medicaid Other | Admitting: Physician Assistant

## 2023-08-27 VITALS — BP 118/82 | HR 95 | Temp 98.3°F | Resp 16 | Ht 61.0 in | Wt 137.2 lb

## 2023-08-27 DIAGNOSIS — Z09 Encounter for follow-up examination after completed treatment for conditions other than malignant neoplasm: Secondary | ICD-10-CM

## 2023-08-27 DIAGNOSIS — K353 Acute appendicitis with localized peritonitis, without perforation or gangrene: Secondary | ICD-10-CM | POA: Diagnosis not present

## 2023-08-27 NOTE — Progress Notes (Signed)
Acute Office Visit   Patient: Savannah Washington   DOB: July 02, 1961   62 y.o. Female  MRN: [id number] Visit Date: 08/27/2023  Today's healthcare provider: Oswaldo Conroy [place] Vierling, PA-C  Introduced myself to the patient as a Secondary school teacher and provided education on APPs in clinical practice.    Chief Complaint  Patient presents with   Hospitalization Follow-up    Appendix removed- feeling better   Subjective    HPI HPI     Hospitalization Follow-up    Additional comments: Appendix removed- feeling better      Last edited by Forde Radon, CMA on 08/27/2023 10:22 AM.       Patient was DC from [facility] 08[facility]19/23 She was admitted for acute appendicitis with removal via robotic assisted laparoscopic appendectomy   She reports she is feeling better from initial episode but is feeling a bit sore   She is not sure if she has follow up with her surgeon      Medications: Outpatient Medications Prior to Visit  Medication Sig   albuterol (PROAIR HFA) 108 (90 Base) MCG/ACT inhaler Inhale 2 puffs into the lungs every 6 (six) hours as needed for wheezing or shortness of breath.   atorvastatin (LIPITOR) 80 MG tablet Take 1 tablet (80 mg total) by mouth at bedtime.   buPROPion (WELLBUTRIN XL) 300 MG 24 hr tablet Take 1 tablet (300 mg total) by mouth daily.   cetirizine (ZYRTEC) 10 MG tablet Take 10 mg by mouth daily.   Fluticasone-Umeclidin-Vilant (TRELEGY ELLIPTA) 100-62.5-25 MCG/ACT AEPB Inhale 1 each into the lungs daily.   lisinopril (ZESTRIL) 10 MG tablet Take 1 tablet (10 mg total) by mouth daily.   meloxicam (MOBIC) 15 MG tablet Take 0.5-1 tablets (7.5-15 mg total) by mouth daily as needed for pain (severe pain).   montelukast (SINGULAIR) 10 MG tablet Take 1 tablet (10 mg total) by mouth at bedtime.   Multiple Vitamins-Minerals (MULTI COMPLETE PO) Take 1,000 Units by mouth daily.   nystatin (MYCOSTATIN/NYSTOP) powder APPLY  POWDER TOPICALLY THREE TIMES DAILY TO AFFECTED AREA AS  NEEDED   ondansetron (ZOFRAN) 4 MG tablet Take 1 tablet (4 mg total) by mouth every 8 (eight) hours as needed for nausea or vomiting.   pregabalin (LYRICA) 25 MG capsule Take 1 capsule (25 mg total) by mouth 2 (two) times daily. For neck or musculoskeletal pain   Turmeric 500 MG CAPS Take 500 mg by mouth daily.   Baclofen 5 MG TABS Take 1-2 tablets (5-10 mg total) by mouth 3 (three) times daily as needed (msk pain or spasms). (Patient not taking: Reported on 08/19/2023)   ipratropium-albuterol (DUONEB) 0.5-2.5 (3) MG/3ML SOLN Take 3 mLs by nebulization every 8 (eight) hours as needed. (Patient not taking: Reported on 08/19/2023)   predniSONE (DELTASONE) 20 MG tablet 2 tabs poqday 1-3, 1 tabs poqday 4-6 (Patient not taking: Reported on 08/18/2023)   No facility-administered medications prior to visit.    Review of Systems  Constitutional:  Negative for chills and fever.  Gastrointestinal:  Negative for constipation, diarrhea, nausea and vomiting.       Abdominal tenderness  Musculoskeletal:  Positive for myalgias.        Objective    BP 118/82   Pulse 95   Temp 98.3 F (36.8 C) (Oral)   Resp 16   Ht 5\' 1"  (1.549 m)   Wt 137 lb 3.2 oz (62.2 kg)   SpO2 95%   BMI 25.92  kg/m     Physical Exam Vitals reviewed.  Constitutional:      Appearance: Normal appearance.  HENT:     Head: Normocephalic and atraumatic.  Abdominal:     General: Abdomen is flat.     Palpations: Abdomen is soft.     Tenderness: There is abdominal tenderness.     Comments: Generalized tenderness present For surgical incisions noted along the left side of the abdomen.  All appear well-healing.  No signs of erythema, swelling, drainage, bleeding at this time.  Mild bruising noted along left side of abdomen.  Skin:    General: Skin is warm and dry.  Neurological:     General: No focal deficit present.     Mental Status: She is alert and oriented to person, place, and time.  Psychiatric:        Mood and  Affect: Mood normal.        Behavior: Behavior normal.        Thought Content: Thought content normal.        Judgment: Judgment normal.       No results found for any visits on 08/27/23.  Assessment & Plan      Return in about 3 months (around 11/27/2023) for chronic follow up .     Problem List Items Addressed This Visit       Digestive   Acute appendicitis with localized peritonitis - Primary   Other Visit Diagnoses     Hospital discharge follow-up          Acute, resolved Patient underwent robotic assisted laparoscopic appendectomy on 08/18/2023.  I reviewed her hospital discharge notes from surgery.  She appears to be recovering well Surgical sites appear well-healing Recommend that she follows up with surgeon for general clearance and postop evaluation Reviewed signs and symptoms of infection, complications Follow-up as needed for progressing or persistent symptoms.  Recommend regular follow-up for chronic management in about 3 months  Return in about 3 months (around 11/27/2023) for chronic follow up .   I, Nakota Elsen E Gatlin Kittell, PA-C, have reviewed all documentation for this visit. The documentation on 08/27/23 for the exam, diagnosis, procedures, and orders are all accurate and complete.   Jacquelin Hawking, MHS, PA-C [facility]

## 2023-08-27 NOTE — Patient Instructions (Addendum)
This is the contact information for your surgeon Dr. Carolan Shiver, MD   [facility] Surgery Department [address] [place], [place]  [address] [phone]  Please reach out to their office to schedule a surgical follow up so they can evaluate your incisions and make sure you are healing well from your appendectomy.   For now please continue with your home measures as directed at discharge. Give [place] a call if you have any concerns or if you notice changes to your surgical sites such as redness, swelling, drainage, streaks, fevers,chills, trouble going to the bathroom

## 2023-09-19 DIAGNOSIS — M5416 Radiculopathy, lumbar region: Secondary | ICD-10-CM | POA: Diagnosis not present

## 2023-10-27 DIAGNOSIS — M5416 Radiculopathy, lumbar region: Secondary | ICD-10-CM | POA: Diagnosis not present

## 2023-10-27 DIAGNOSIS — M48062 Spinal stenosis, lumbar region with neurogenic claudication: Secondary | ICD-10-CM | POA: Diagnosis not present

## 2023-10-31 ENCOUNTER — Inpatient Hospital Stay
Admission: RE | Admit: 2023-10-31 | Discharge: 2023-10-31 | Disposition: A | Discharge status: Home / Self Care | Payer: Self-pay | Source: Ambulatory Visit | Attending: Neurosurgery | Admitting: Neurosurgery

## 2023-10-31 ENCOUNTER — Other Ambulatory Visit: Payer: Self-pay | Admitting: Family Medicine

## 2023-10-31 DIAGNOSIS — Z049 Encounter for examination and observation for unspecified reason: Secondary | ICD-10-CM

## 2023-10-31 NOTE — Progress Notes (Unsigned)
Referring Physician:  Denton Lank, FNP [id number] [place],  [place] [address]  Primary Physician:  Danelle Berry, PA-C  History of Present Illness: 11/03/2023 Ms. Savannah Washington is here today with a chief complaint of back pain that radiates to the bilateral lower extremities.  Her back pain is quite severe and causes radiation down into her legs especially while standing.  She feels that this worsens with being up for a considerable period of time or with walking.  She states that she feels better if she leans forward.  She gets severe tightening and bandlike pain across the midline low back radiating to bilateral flanks.  This then radiates down to her legs with numbness and tingling.  She has been dealing with this for 2 to 3 years.  She has not had formal physical therapy but she has had some lumbar epidural injections.  She did not get significant improvement with her epidural injections.  She has been taking anti-inflammatories, steroids, antinerve pain medications as well as intermittent narcotic pain medication use.  She is not having any bowel or bladder difficulty.  She does feel better if she lays in a recumbent position and she feels worse when she stands upright.  She does smoke, she is interested in stopping.  She states that she does get some neck pain that radiates into her bilateral upper extremities.  She has noticed some loss of fine motor skills and does feel sometimes that she is imbalanced when she is walking.  Low back pain that goes into the bilateral legs, the leg pain is worse.   Conservative measures:  Physical therapy: none for her back Multimodal medical therapy including regular antiinflammatories: meloxicam, prednisone, Lyrica, hydrocodone Injections:   Bilateral L5-S1 transforaminal epidural injections 09/19/2023-- she describes no relief following epidural steroid injection.   Past Surgery:  no spinal surgeries  The symptoms are causing a  significant impact on the patient's life.   I have utilized the care everywhere function in epic to review the outside records available from external health systems.  Review of Systems:  A 10 point review of systems is negative, except for the pertinent positives and negatives detailed in the HPI.  Past Medical History: Past Medical History:  Diagnosis Date   Anxiety    Aortic atherosclerosis (HCC) 12/29/2017   Chest CT March 2019   Cervical arthritis 09/04/2017   xrays Dec 2018   Degenerative disc disease, cervical 09/04/2017   Xrays Dec 2018   Depression    Emphysema lung (HCC) 12/29/2017   Chest CT March 2019   Hepatic steatosis 12/29/2017   Chest CT March 2019   Hyperlipidemia    Hypertension    Prediabetes 03/14/2017    Past Surgical History: Past Surgical History:  Procedure Laterality Date   BREAST BIOPSY Left 07/18/2022   stereo bx, calcs, COIL clip-path pending   XI ROBOTIC LAPAROSCOPIC ASSISTED APPENDECTOMY N/A 08/18/2023   Procedure: XI ROBOTIC LAPAROSCOPIC ASSISTED APPENDECTOMY;  Surgeon: Carolan Shiver, MD;  Location: [facility] ORS;  Service: General;  Laterality: N/A;    Allergies: Allergies as of 11/03/2023   (No Known Allergies)    Medications:  Current Outpatient Medications:    HYDROcodone-acetaminophen (NORCO/VICODIN) 5-325 MG tablet, Take 1 tablet by mouth in the morning and at bedtime., Disp: , Rfl:    albuterol (PROAIR HFA) 108 (90 Base) MCG/ACT inhaler, Inhale 2 puffs into the lungs every 6 (six) hours as needed for wheezing or shortness of breath., Disp: 1 each,  Rfl: 5   atorvastatin (LIPITOR) 80 MG tablet, Take 1 tablet (80 mg total) by mouth at bedtime., Disp: 30 tablet, Rfl: 11   buPROPion (WELLBUTRIN XL) 300 MG 24 hr tablet, Take 1 tablet (300 mg total) by mouth daily., Disp: 90 tablet, Rfl: 0   cetirizine (ZYRTEC) 10 MG tablet, Take 10 mg by mouth daily., Disp: , Rfl:    Fluticasone-Umeclidin-Vilant (TRELEGY ELLIPTA) 100-62.5-25 MCG/ACT AEPB,  Inhale 1 each into the lungs daily., Disp: 60 each, Rfl: 5   ipratropium-albuterol (DUONEB) 0.5-2.5 (3) MG/3ML SOLN, Take 3 mLs by nebulization every 8 (eight) hours as needed. (Patient not taking: Reported on 08/19/2023), Disp: 180 mL, Rfl: 1   lisinopril (ZESTRIL) 10 MG tablet, Take 1 tablet (10 mg total) by mouth daily., Disp: 30 tablet, Rfl: 5   montelukast (SINGULAIR) 10 MG tablet, Take 1 tablet (10 mg total) by mouth at bedtime., Disp: 30 tablet, Rfl: 3   Multiple Vitamins-Minerals (MULTI COMPLETE PO), Take 1,000 Units by mouth daily., Disp: , Rfl:    nystatin (MYCOSTATIN/NYSTOP) powder, APPLY  POWDER TOPICALLY THREE TIMES DAILY TO AFFECTED AREA AS NEEDED, Disp: 60 g, Rfl: 1   ondansetron (ZOFRAN) 4 MG tablet, Take 1 tablet (4 mg total) by mouth every 8 (eight) hours as needed for nausea or vomiting., Disp: 20 tablet, Rfl: 0   pregabalin (LYRICA) 25 MG capsule, Take 1 capsule (25 mg total) by mouth 2 (two) times daily. For neck or musculoskeletal pain, Disp: 180 capsule, Rfl: 0   Turmeric 500 MG CAPS, Take 500 mg by mouth daily., Disp: , Rfl:   Social History: Social History   Tobacco Use   Smoking status: Every Day    Current packs/day: 0.50    Average packs/day: 0.5 packs/day for 44.5 years (22.2 ttl pk-yrs)    Types: Cigarettes    Start date: 08/30/2018   Smokeless tobacco: Never  Vaping Use   Vaping status: Former  Substance Use Topics   Alcohol use: Yes    Alcohol/week: 5.0 standard drinks of alcohol    Types: 1 Glasses of wine, 4 Cans of beer per week   Drug use: No    Family Medical History: Family History  Problem Relation Age of Onset   Dementia Mother    Breast cancer Sister 77   Aneurysm Father    Heart attack Brother    Heart defect Brother    Brain cancer Daughter    Heart attack Paternal Grandfather    Drug abuse Sister     Physical Examination: Vitals:   11/03/23 1053  BP: 138/82    General: Patient is in no apparent distress. Attention to  examination is appropriate.  Neck:   Supple.  Full range of motion.  Respiratory: Patient is breathing without any difficulty.   NEUROLOGICAL:     Awake, alert, oriented to person, place, and time.  Speech is clear and fluent.   Cranial Nerves: Pupils equal round and reactive to light.  Facial tone is symmetric.  Facial sensation is symmetric. Shoulder shrug is symmetric. Tongue protrusion is midline.    Strength: Some slight limitations due to pain, however confrontational strength feels full throughout Side Biceps Triceps Deltoid Interossei Grip Wrist Ext. Wrist Flex.  R 5 5 5 5 5 5 5   L 5 5 5 5 5 5 5    Side Iliopsoas Quads Hamstring PF DF EHL  R 5 5 5 5 5 5   L 5 5 5 5 5 5    Reflexes are hyperreflexic  at the bilateral brachial radialis with spreading into the hands, biceps are hyperreflexic as well.  She also has bilateral Hoffmann's left worse than right..  Her patellar reflexes are hyperreflexic, Achilles are decreased significantly.  No evidence of clonus.  Some mild patchy light sensation loss not clear dermatomal distribution     No evidence of dysmetria noted.  Antalgic gait  Imaging: Narrative & Impression  CLINICAL DATA:  Low back pain with bilateral posterior leg pain extending down to the ankles.   EXAM: MRI LUMBAR SPINE WITHOUT CONTRAST   TECHNIQUE: Multiplanar, multisequence MR imaging of the lumbar spine was performed. No intravenous contrast was administered.   COMPARISON:  None Available.   FINDINGS: Segmentation:  Standard.   Alignment:  L4-5 grade 1 anterolisthesis, degenerative.   Vertebrae:  No fracture, evidence of discitis, or bone lesion.   Conus medullaris and cauda equina: Conus extends to the L1 level. Conus and cauda equina appear normal.   Paraspinal and other soft tissues: Negative for perispinal mass or inflammation. Left colonic diverticulosis   Disc levels:   L3-L4: Disc narrowing and bulging.  No neural compression.    L4-L5: Degenerative facet spurring with anterolisthesis. The disc is narrowed and bulging with advanced thecal sac stenosis. Moderate right foraminal narrowing.   L5-S1:Greatest level of degenerative disc narrowing with endplate and facet spurring. The canal and foramina are patent.   IMPRESSION: Lumbar spine degeneration most notable at L4-5 where there is advanced facet osteoarthritis, anterolisthesis, and compressive spinal stenosis. Moderate right foraminal narrowing at the same level.     Electronically Signed   By: Tiburcio Pea M.D.   On: 08/12/2023 10:04     Grade 1 spondylolisthesis noted with compressive spinal stenosis secondary to the slip as well as foraminal stenosis secondary to the uptown compression.  I have personally reviewed the images and agree with the above interpretation.  Medical Decision Making/Assessment and Plan: Ms. Karnik is a pleasant 63 y.o. female with history of chronic back pain radiating into her bilateral lower extremities as well as intermittent neck pain and radiation into her bilateral upper extremities left worse than right.  She states that she has had worsening back pain and worsening numbness and tingling in her lower extremities especially when she is upright and walking.  This improves if she is laying down or resting.  In regards to her upper extremities she does have some loss of fine motor skills, she also feels that she is somewhat unsteady on her feet.  She has not yet had physical therapy and given her strength preservation on examination would like to refer her to physical therapy for full evaluation for both her back and her cervical spine.  Given her grade 1 spondylolisthesis with severe foraminal stenosis if she does have a surgical intervention in the lumbar spine this may require a spinal fusion with an interbody spacing device, we counseled her on the necessity of smoking cessation.  We have also made a referral to the smoking  cessation team.  We let her know that we would not go forward with any corrective type procedures or fusion type procedures should she continue to have nicotine exposure as it is a good severely increases the risk of failure.  In regards to her lumbar spondylolisthesis we will plan to get a lumbar flexion-extension film to evaluate for any mobility aspect of the listhesis.  Will also plan on getting cervical flexion-extension x-rays given her history of neck pain radiating into her bilateral upper  extremities and hyperreflexia which could be representative of cervical myelopathy.  Should she have significant degenerative changes on her cervical spine would plan for a cervical MRI.  Thank you for involving me in the care of this patient.    Lovenia Kim MD/MSCR Neurosurgery

## 2023-11-03 ENCOUNTER — Ambulatory Visit
Admission: RE | Admit: 2023-11-03 | Discharge: 2023-11-03 | Disposition: A | Discharge status: Home / Self Care | Payer: Medicaid Other | Source: Ambulatory Visit | Attending: Neurosurgery | Admitting: Neurosurgery

## 2023-11-03 ENCOUNTER — Ambulatory Visit: Payer: Medicaid Other | Admitting: Neurosurgery

## 2023-11-03 ENCOUNTER — Ambulatory Visit
Admission: RE | Admit: 2023-11-03 | Discharge: 2023-11-03 | Disposition: A | Discharge status: Home / Self Care | Payer: Medicaid Other | Attending: Neurosurgery | Admitting: Neurosurgery

## 2023-11-03 VITALS — BP 138/82 | Ht 61.0 in | Wt 140.4 lb

## 2023-11-03 DIAGNOSIS — M4316 Spondylolisthesis, lumbar region: Secondary | ICD-10-CM | POA: Diagnosis not present

## 2023-11-03 DIAGNOSIS — M48062 Spinal stenosis, lumbar region with neurogenic claudication: Secondary | ICD-10-CM | POA: Diagnosis not present

## 2023-11-03 DIAGNOSIS — M5459 Other low back pain: Secondary | ICD-10-CM | POA: Diagnosis not present

## 2023-11-03 DIAGNOSIS — M8580 Other specified disorders of bone density and structure, unspecified site: Secondary | ICD-10-CM | POA: Diagnosis not present

## 2023-11-03 DIAGNOSIS — M542 Cervicalgia: Secondary | ICD-10-CM | POA: Insufficient documentation

## 2023-11-03 DIAGNOSIS — M4312 Spondylolisthesis, cervical region: Secondary | ICD-10-CM | POA: Diagnosis not present

## 2023-11-03 DIAGNOSIS — R292 Abnormal reflex: Secondary | ICD-10-CM | POA: Diagnosis not present

## 2023-11-03 DIAGNOSIS — M47816 Spondylosis without myelopathy or radiculopathy, lumbar region: Secondary | ICD-10-CM | POA: Diagnosis not present

## 2023-11-03 DIAGNOSIS — M4802 Spinal stenosis, cervical region: Secondary | ICD-10-CM | POA: Diagnosis not present

## 2023-11-03 DIAGNOSIS — F17200 Nicotine dependence, unspecified, uncomplicated: Secondary | ICD-10-CM

## 2023-11-03 DIAGNOSIS — M47817 Spondylosis without myelopathy or radiculopathy, lumbosacral region: Secondary | ICD-10-CM | POA: Diagnosis not present

## 2023-11-03 DIAGNOSIS — M4807 Spinal stenosis, lumbosacral region: Secondary | ICD-10-CM | POA: Diagnosis not present

## 2023-11-03 DIAGNOSIS — M47812 Spondylosis without myelopathy or radiculopathy, cervical region: Secondary | ICD-10-CM | POA: Diagnosis not present

## 2023-11-03 DIAGNOSIS — M5126 Other intervertebral disc displacement, lumbar region: Secondary | ICD-10-CM | POA: Diagnosis not present

## 2023-11-03 DIAGNOSIS — IMO0001 Reserved for inherently not codable concepts without codable children: Secondary | ICD-10-CM

## 2023-11-11 ENCOUNTER — Inpatient Hospital Stay: Admission: RE | Admit: 2023-11-11 | Payer: Medicaid Other | Source: Ambulatory Visit

## 2023-11-20 ENCOUNTER — Other Ambulatory Visit: Payer: Self-pay | Admitting: Neurosurgery

## 2023-11-20 ENCOUNTER — Encounter: Payer: Self-pay | Admitting: Neurosurgery

## 2023-11-20 DIAGNOSIS — M4712 Other spondylosis with myelopathy, cervical region: Secondary | ICD-10-CM | POA: Insufficient documentation

## 2023-11-20 NOTE — Progress Notes (Signed)
 Patient has a history of hyperreflexia on physical examination.  Because of this I ordered a cervical x-ray with flexion-extension films.  She has at least a grade 1 cervical spondylolisthesis which could be causing cervical stenosis and may be the generating cause of her hyperreflexia.  Because of this I want a rule out cervical myelopathy.  Will plan to get cross-sectional imaging including an MRI noncontrast of the cervical spine.  This will help [place] to evaluate the safety of going forward with a lumbar fusion surgery or if she needs to have her cervical spine addressed first.  We will follow-up with her after her cervical MRI.

## 2023-11-24 DIAGNOSIS — M5416 Radiculopathy, lumbar region: Secondary | ICD-10-CM | POA: Diagnosis not present

## 2023-11-24 DIAGNOSIS — M48062 Spinal stenosis, lumbar region with neurogenic claudication: Secondary | ICD-10-CM | POA: Diagnosis not present

## 2023-11-30 ENCOUNTER — Other Ambulatory Visit: Payer: Self-pay | Admitting: Family Medicine

## 2023-11-30 DIAGNOSIS — F418 Other specified anxiety disorders: Secondary | ICD-10-CM

## 2023-11-30 DIAGNOSIS — Z87891 Personal history of nicotine dependence: Secondary | ICD-10-CM

## 2023-12-01 ENCOUNTER — Ambulatory Visit
Admission: RE | Admit: 2023-12-01 | Discharge: 2023-12-01 | Disposition: A | Discharge status: Home / Self Care | Payer: Medicaid Other | Source: Ambulatory Visit | Attending: Neurosurgery | Admitting: Neurosurgery

## 2023-12-01 DIAGNOSIS — M48062 Spinal stenosis, lumbar region with neurogenic claudication: Secondary | ICD-10-CM | POA: Diagnosis not present

## 2023-12-01 DIAGNOSIS — M5416 Radiculopathy, lumbar region: Secondary | ICD-10-CM | POA: Diagnosis not present

## 2023-12-01 DIAGNOSIS — M4712 Other spondylosis with myelopathy, cervical region: Secondary | ICD-10-CM

## 2023-12-01 NOTE — Telephone Encounter (Signed)
MB is full  

## 2023-12-01 NOTE — Telephone Encounter (Signed)
 Needs appt

## 2023-12-02 ENCOUNTER — Telehealth: Payer: Self-pay | Admitting: Neurosurgery

## 2023-12-02 NOTE — Telephone Encounter (Signed)
 Patient is calling to let our office know that she was unable to complete her MRI yesterday due to it being closed. She would like to know what she needs to do. Please advise.

## 2023-12-02 NOTE — Telephone Encounter (Signed)
 I sent a message to scheduling to get her rescheduled for [place].

## 2023-12-02 NOTE — Telephone Encounter (Signed)
 I'm assuming that she means the machine was closed?   For the larger/open machines, there is [facility] in [place] and [facility]. Out of those 2 can you ask her which one she would rather go to?

## 2023-12-15 ENCOUNTER — Ambulatory Visit (HOSPITAL_COMMUNITY)
Admission: RE | Admit: 2023-12-15 | Discharge: 2023-12-15 | Disposition: A | Discharge status: Home / Self Care | Source: Ambulatory Visit | Attending: Neurosurgery | Admitting: Neurosurgery

## 2023-12-15 DIAGNOSIS — M4722 Other spondylosis with radiculopathy, cervical region: Secondary | ICD-10-CM | POA: Diagnosis not present

## 2023-12-15 DIAGNOSIS — M4712 Other spondylosis with myelopathy, cervical region: Secondary | ICD-10-CM | POA: Insufficient documentation

## 2023-12-15 DIAGNOSIS — M48062 Spinal stenosis, lumbar region with neurogenic claudication: Secondary | ICD-10-CM | POA: Diagnosis not present

## 2023-12-15 DIAGNOSIS — M4802 Spinal stenosis, cervical region: Secondary | ICD-10-CM | POA: Diagnosis not present

## 2023-12-15 DIAGNOSIS — M4312 Spondylolisthesis, cervical region: Secondary | ICD-10-CM | POA: Diagnosis not present

## 2023-12-15 DIAGNOSIS — M47812 Spondylosis without myelopathy or radiculopathy, cervical region: Secondary | ICD-10-CM | POA: Diagnosis not present

## 2023-12-15 DIAGNOSIS — M5416 Radiculopathy, lumbar region: Secondary | ICD-10-CM | POA: Diagnosis not present

## 2023-12-15 DIAGNOSIS — M50022 Cervical disc disorder at C5-C6 level with myelopathy: Secondary | ICD-10-CM | POA: Diagnosis not present

## 2023-12-22 ENCOUNTER — Encounter: Payer: Self-pay | Admitting: Family Medicine

## 2023-12-22 ENCOUNTER — Ambulatory Visit: Admitting: Family Medicine

## 2023-12-22 ENCOUNTER — Ambulatory Visit: Payer: Self-pay

## 2023-12-22 VITALS — BP 132/70 | HR 71 | Resp 16 | Ht 61.0 in | Wt 143.0 lb

## 2023-12-22 DIAGNOSIS — E1142 Type 2 diabetes mellitus with diabetic polyneuropathy: Secondary | ICD-10-CM | POA: Diagnosis not present

## 2023-12-22 DIAGNOSIS — I700 Atherosclerosis of aorta: Secondary | ICD-10-CM | POA: Diagnosis not present

## 2023-12-22 DIAGNOSIS — F418 Other specified anxiety disorders: Secondary | ICD-10-CM

## 2023-12-22 DIAGNOSIS — E782 Mixed hyperlipidemia: Secondary | ICD-10-CM | POA: Diagnosis not present

## 2023-12-22 DIAGNOSIS — Z5181 Encounter for therapeutic drug level monitoring: Secondary | ICD-10-CM

## 2023-12-22 DIAGNOSIS — M48062 Spinal stenosis, lumbar region with neurogenic claudication: Secondary | ICD-10-CM | POA: Diagnosis not present

## 2023-12-22 DIAGNOSIS — Z87891 Personal history of nicotine dependence: Secondary | ICD-10-CM

## 2023-12-22 DIAGNOSIS — I10 Essential (primary) hypertension: Secondary | ICD-10-CM

## 2023-12-22 DIAGNOSIS — M5416 Radiculopathy, lumbar region: Secondary | ICD-10-CM | POA: Diagnosis not present

## 2023-12-22 DIAGNOSIS — Z716 Tobacco abuse counseling: Secondary | ICD-10-CM

## 2023-12-22 DIAGNOSIS — R300 Dysuria: Secondary | ICD-10-CM | POA: Diagnosis not present

## 2023-12-22 DIAGNOSIS — M4722 Other spondylosis with radiculopathy, cervical region: Secondary | ICD-10-CM | POA: Diagnosis not present

## 2023-12-22 DIAGNOSIS — M4712 Other spondylosis with myelopathy, cervical region: Secondary | ICD-10-CM | POA: Diagnosis not present

## 2023-12-22 DIAGNOSIS — Z720 Tobacco use: Secondary | ICD-10-CM

## 2023-12-22 LAB — POCT URINALYSIS DIPSTICK
Appearance: NORMAL
Bilirubin, UA: NEGATIVE
Blood, UA: NEGATIVE
Glucose, UA: NEGATIVE
Ketones, UA: NEGATIVE
Leukocytes, UA: NEGATIVE
Nitrite, UA: NEGATIVE
Protein, UA: POSITIVE — AB
Spec Grav, UA: >=1.03 — AB (ref 1.010–1.025)
Urobilinogen, UA: 0.2 U/dL
pH, UA: 6 (ref 5.0–8.0)

## 2023-12-22 MED ORDER — CEPHALEXIN 500 MG PO CAPS: 500.0000 mg | ORAL_CAPSULE | Freq: Two times a day (BID) | ORAL | 0 refills | Status: AC

## 2023-12-22 MED ORDER — PREGABALIN 25 MG PO CAPS: 25.0000 mg | ORAL_CAPSULE | Freq: Two times a day (BID) | ORAL | 0 refills | Status: DC

## 2023-12-22 MED ORDER — BUPROPION HCL ER (XL) 300 MG PO TB24
300.0000 mg | ORAL_TABLET | Freq: Every day | ORAL | 1 refills | Status: DC
Start: 2023-12-22 — End: 2024-05-28

## 2023-12-22 MED ORDER — LISINOPRIL 10 MG PO TABS: 10.0000 mg | ORAL_TABLET | Freq: Every day | ORAL | 1 refills | Status: DC

## 2023-12-22 MED ORDER — VARENICLINE TARTRATE (STARTER) 0.5 MG X 11 & 1 MG X 42 PO TBPK: ORAL_TABLET | ORAL | 0 refills | Status: DC

## 2023-12-22 NOTE — Progress Notes (Signed)
 Patient ID: Savannah Washington, female    DOB: 07/19/1961, 63 y.o.   MRN: [id number]  PCP: Danelle Berry, PA-C  Chief Complaint  Patient presents with   Dysuria    X2 days    Subjective:   Savannah Washington is a 63 y.o. female, presents to clinic with CC of the following:  HPI  Here for UTI concerns/dysuria x 2 d with frequency and urgency, some scant blood onset 2 d ago Some bladder spasms and contractions after urination, no abd pain, flank pain, N, V, D, fevers sweats chills or change to appetite Reviewed dip Results for orders placed or performed in visit on 12/22/23  POCT Urinalysis Dipstick   Collection Time: 12/22/23  1:56 PM  Result Value Ref Range   Color, UA Yellow    Clarity, UA Clear    Glucose, UA Negative Negative   Bilirubin, UA Negative    Ketones, UA Negative    Spec Grav, UA >=1.030 (A) 1.010 - 1.025   Blood, UA Negative    pH, UA 6.0 5.0 - 8.0   Protein, UA Positive (A) Negative   Urobilinogen, UA 0.2 0.2 or 1.0 E.U./dL   Nitrite, UA Negative    Leukocytes, UA Negative Negative   Appearance Normal    Odor None   Urine + protein and high specific grav, neg blood, nitrites and LE   Due for routine f/up soon DM, HTN, HLD, GERD Also due for multiple gaps in HM     Patient Active Problem List   Diagnosis Date Noted   Cervical spondylosis with myelopathy and radiculopathy 11/20/2023   Spondylolisthesis at L4-L5 level 11/03/2023   Spinal stenosis, lumbar region, with neurogenic claudication 11/03/2023   Mechanical low back pain 11/03/2023   Neck pain 11/03/2023   Hyperreflexia 11/03/2023   Acute appendicitis with localized peritonitis 08/18/2023   Osteopenia 06/04/2022   Current mild episode of major depressive disorder, unspecified whether recurrent (HCC) 02/12/2021   Type 2 diabetes mellitus with diabetic polyneuropathy, without long-term current use of insulin (HCC) 02/04/2020   Smoking 12/01/2018   Aortic atherosclerosis (HCC) 12/29/2017    Hepatic steatosis 12/29/2017   COPD (chronic obstructive pulmonary disease) (HCC) 12/05/2017   Degenerative disc disease, cervical 09/04/2017   Cervical arthritis 09/04/2017   Muscle spasm of left shoulder area 09/03/2017   Elevated platelet count 04/11/2017   Marijuana use 03/17/2017   Prediabetes 03/14/2017   Essential hypertension, benign 03/14/2017   Hyperlipidemia 03/14/2017   Anxiety disorder 03/14/2017      Current Outpatient Medications:    albuterol (PROAIR HFA) 108 (90 Base) MCG/ACT inhaler, Inhale 2 puffs into the lungs every 6 (six) hours as needed for wheezing or shortness of breath., Disp: 1 each, Rfl: 5   atorvastatin (LIPITOR) 80 MG tablet, Take 1 tablet (80 mg total) by mouth at bedtime., Disp: 30 tablet, Rfl: 11   buPROPion (WELLBUTRIN XL) 300 MG 24 hr tablet, Take 1 tablet by mouth once daily, Disp: 30 tablet, Rfl: 0   cetirizine (ZYRTEC) 10 MG tablet, Take 10 mg by mouth daily., Disp: , Rfl:    Fluticasone-Umeclidin-Vilant (TRELEGY ELLIPTA) 100-62.5-25 MCG/ACT AEPB, Inhale 1 each into the lungs daily., Disp: 60 each, Rfl: 5   HYDROcodone-acetaminophen (NORCO/VICODIN) 5-325 MG tablet, Take 1 tablet by mouth in the morning and at bedtime., Disp: , Rfl:    lisinopril (ZESTRIL) 10 MG tablet, Take 1 tablet (10 mg total) by mouth daily., Disp: 30 tablet, Rfl: 5   montelukast (SINGULAIR)  10 MG tablet, Take 1 tablet (10 mg total) by mouth at bedtime., Disp: 30 tablet, Rfl: 3   Multiple Vitamins-Minerals (MULTI COMPLETE PO), Take 1,000 Units by mouth daily., Disp: , Rfl:    nystatin (MYCOSTATIN/NYSTOP) powder, APPLY  POWDER TOPICALLY THREE TIMES DAILY TO AFFECTED AREA AS NEEDED, Disp: 60 g, Rfl: 1   ondansetron (ZOFRAN) 4 MG tablet, Take 1 tablet (4 mg total) by mouth every 8 (eight) hours as needed for nausea or vomiting., Disp: 20 tablet, Rfl: 0   pregabalin (LYRICA) 25 MG capsule, Take 1 capsule (25 mg total) by mouth 2 (two) times daily. For neck or musculoskeletal pain,  Disp: 180 capsule, Rfl: 0   Turmeric 500 MG CAPS, Take 500 mg by mouth daily., Disp: , Rfl:    ipratropium-albuterol (DUONEB) 0.5-2.5 (3) MG/3ML SOLN, Take 3 mLs by nebulization every 8 (eight) hours as needed. (Patient not taking: Reported on 08/19/2023), Disp: 180 mL, Rfl: 1   No Known Allergies   Social History   Tobacco Use   Smoking status: Every Day    Current packs/day: 0.50    Average packs/day: 0.5 packs/day for 44.6 years (22.3 ttl pk-yrs)    Types: Cigarettes    Start date: 08/30/2018   Smokeless tobacco: Never  Vaping Use   Vaping status: Former  Substance Use Topics   Alcohol use: Yes    Alcohol/week: 5.0 standard drinks of alcohol    Types: 1 Glasses of wine, 4 Cans of beer per week   Drug use: No      Chart Review Today: I personally reviewed active problem list, medication list, allergies, family history, social history, health maintenance, notes from last encounter, lab results, imaging with the patient/caregiver today.   Review of Systems  Constitutional: Negative.   HENT: Negative.    Eyes: Negative.   Respiratory: Negative.    Cardiovascular: Negative.   Gastrointestinal: Negative.   Endocrine: Negative.   Genitourinary: Negative.   Musculoskeletal: Negative.   Skin: Negative.   Allergic/Immunologic: Negative.   Neurological: Negative.   Hematological: Negative.   Psychiatric/Behavioral: Negative.    All other systems reviewed and are negative.      Objective:   Vitals:   12/22/23 1349  BP: 132/70  Pulse: 71  Resp: 16  SpO2: 99%  Weight: 143 lb (64.9 kg)  Height: 5\' 1"  (1.549 m)    Body mass index is 27.02 kg/m.  Physical Exam Vitals and nursing note reviewed.  Constitutional:      Appearance: Normal appearance. She is well-developed.  HENT:     Head: Normocephalic and atraumatic.     Nose: Nose normal.  Eyes:     General:        Right eye: No discharge.        Left eye: No discharge.     Conjunctiva/sclera: Conjunctivae  normal.  Neck:     Trachea: No tracheal deviation.  Cardiovascular:     Rate and Rhythm: Normal rate and regular rhythm.     Pulses: Normal pulses.     Heart sounds: Normal heart sounds.  Pulmonary:     Effort: Pulmonary effort is normal. No respiratory distress.     Breath sounds: Normal breath sounds. No stridor.  Abdominal:     General: Bowel sounds are normal. There is no distension.     Palpations: Abdomen is soft.     Tenderness: There is no abdominal tenderness. There is no right CVA tenderness, left CVA tenderness, guarding or rebound.  Musculoskeletal:        General: Normal range of motion.  Skin:    General: Skin is warm and dry.     Findings: No rash.  Neurological:     Mental Status: She is alert.     Motor: No abnormal muscle tone.     Coordination: Coordination normal.  Psychiatric:        Behavior: Behavior normal.      Results for orders placed or performed in visit on 12/22/23  POCT Urinalysis Dipstick   Collection Time: 12/22/23  1:56 PM  Result Value Ref Range   Color, UA Yellow    Clarity, UA Clear    Glucose, UA Negative Negative   Bilirubin, UA Negative    Ketones, UA Negative    Spec Grav, UA >=1.030 (A) 1.010 - 1.025   Blood, UA Negative    pH, UA 6.0 5.0 - 8.0   Protein, UA Positive (A) Negative   Urobilinogen, UA 0.2 0.2 or 1.0 E.U./dL   Nitrite, UA Negative    Leukocytes, UA Negative Negative   Appearance Normal    Odor None        Assessment & Plan:   Acute OV done today - labs ordered today for chronic conditions to be reviewed at next OV which she needs to schedule 1. Dysuria (Primary) Tx empirically for simple UTI Pt well appearing, VSS, abd exam benign - Urine Culture - POCT Urinalysis Dipstick - cephALEXin (KEFLEX) 500 MG capsule; Take 1 capsule (500 mg total) by mouth 2 (two) times daily for 5 days.  Dispense: 10 capsule; Refill: 0  2. Type 2 diabetes mellitus with diabetic polyneuropathy, without long-term current use of  insulin (HCC) Labs done today only - COMPLETE METABOLIC PANEL WITH GFR - Hemoglobin A1c  3. Essential hypertension, benign  - lisinopril (ZESTRIL) 10 MG tablet; Take 1 tablet (10 mg total) by mouth daily.  Dispense: 90 tablet; Refill: 1  4. Other specified anxiety disorders  - buPROPion (WELLBUTRIN XL) 300 MG 24 hr tablet; Take 1 tablet (300 mg total) by mouth daily.  Dispense: 90 tablet; Refill: 1  5. History of tobacco use, presenting hazards to health Smoking cessation instruction/counseling given:  counseled patient on the dangers of tobacco use, advised patient to stop smoking, and reviewed strategies to maximize success Chantix and wellbutrin and other recommendations reviewed today - buPROPion (WELLBUTRIN XL) 300 MG 24 hr tablet; Take 1 tablet (300 mg total) by mouth daily.  Dispense: 90 tablet; Refill: 1  6. Mixed hyperlipidemia - COMPLETE METABOLIC PANEL WITH GFR - Lipid panel  7. Aortic atherosclerosis (HCC) - COMPLETE METABOLIC PANEL WITH GFR - Lipid panel  8. Encounter for medication monitoring  - CBC with Differential/Platelet - COMPLETE METABOLIC PANEL WITH GFR - Hemoglobin A1c - Lipid panel  9. Cervical spondylosis with myelopathy and radiculopathy Retrial of meds Doing PT, seeing spine specialists, also doing alternative tx which are helping - pregabalin (LYRICA) 25 MG capsule; Take 1 capsule (25 mg total) by mouth 2 (two) times daily.  Dispense: 180 capsule; Refill: 0  10. Encounter for smoking cessation counseling See counseling above, reviewed chantix dosing, MOA, benefits, possible SE She has taken and tolerated before Counseling for 5 mint + today - Varenicline Tartrate, Starter, (CHANTIX STARTING MONTH PAK) 0.5 MG X 11 & 1 MG X 42 TBPK; Take 0.5 mg tablet by mouth 1x daily for 3 days, then increase to 0.5 mg tablet 2x daily for 4 days, then increase to 1  mg tablet 2x daily.  Dispense: 42 each; Refill: 0  11. Tobacco abuse  - Varenicline Tartrate,  Starter, (CHANTIX STARTING MONTH PAK) 0.5 MG X 11 & 1 MG X 42 TBPK; Take 0.5 mg tablet by mouth 1x daily for 3 days, then increase to 0.5 mg tablet 2x daily for 4 days, then increase to 1 mg tablet 2x daily.  Dispense: 42 each; Refill: 0    Schedule routine f/up in the next 2 weeks - to address labs - Dx above, do foot exam and UACR (did not do today due to protein and abnormal dip)  CPE in the next 3-4 months - due for mammo, cervical ca screening, immunizatioins  Danelle Berry, PA-C 12/22/23 2:05 PM

## 2023-12-22 NOTE — Telephone Encounter (Addendum)
 This RN second attempt to contact patient for triage. No answer, voicemail left requesting return call to clinic.   This RN first attempt to contact patient for triage. No answer, voicemail left requesting return call to clinic.

## 2023-12-22 NOTE — Telephone Encounter (Signed)
  Chief Complaint: pain post void Symptoms: pain, cloudy urine Frequency: yesterday Pertinent Negatives: Patient denies fever, flank pain, ABD pain, hematuria, odorous urine,  Disposition: [] ED /[] Urgent Care (no appt availability in office) / [x] Appointment(In office/virtual)/ []  [facility]/ [] Home Care/ [] Refused Recommended Disposition /[] [facility]/ []  Follow-up with PCP Additional Notes: Pt states that she started with pain post void yesterday. Pt states that it feels like she is having a contraction every time she finishes voiding. Pt states that her urine is cloudy but does not have an odor. Pt denies abd and flank pain, also denies hematuria. Pt states that she probably has a hx of uti but isn't sure. Pt sched for today with PCP.   Reason for Disposition  Urinating more frequently than usual (i.e., frequency)  Answer Assessment - Initial Assessment Questions 1. SYMPTOM: "What's the main symptom you're concerned about?" (e.g., frequency, incontinence)     Contraction after I pee 2. ONSET: "When did the  contraction  start?"     yesterday 3. PAIN: "Is there any pain?" If Yes, ask: "How bad is it?" (Scale: 1-10; mild, moderate, severe)     After I pee I feel contraction-5 4. CAUSE: "What do you think is causing the symptoms?"     Im not sure, UTI or yeast infection or something 5. OTHER SYMPTOMS: "Do you have any other symptoms?" (e.g., blood in urine, fever, flank pain, pain with urination)     denies  Protocols used: Urinary Symptoms-A-AH

## 2023-12-23 LAB — CBC WITH DIFFERENTIAL/PLATELET
Absolute Lymphocytes: 2459 {cells}/uL (ref 850–3900)
Absolute Monocytes: 800 {cells}/uL (ref 200–950)
Basophils Absolute: 46 {cells}/uL (ref 0–200)
Basophils Relative: 0.4 %
Eosinophils Absolute: 162 {cells}/uL (ref 15–500)
Eosinophils Relative: 1.4 %
HCT: 41.6 % (ref 35.0–45.0)
Hemoglobin: 13.9 g/dL (ref 11.7–15.5)
MCH: 31 pg (ref 27.0–33.0)
MCHC: 33.4 g/dL (ref 32.0–36.0)
MCV: 92.7 fL (ref 80.0–100.0)
MPV: 8.6 fL (ref 7.5–12.5)
Monocytes Relative: 6.9 %
Neutro Abs: 8132 {cells}/uL — ABNORMAL HIGH (ref 1500–7800)
Neutrophils Relative %: 70.1 %
Platelets: 391 10*3/uL (ref 140–400)
RBC: 4.49 10*6/uL (ref 3.80–5.10)
RDW: 12.9 % (ref 11.0–15.0)
Total Lymphocyte: 21.2 %
WBC: 11.6 10*3/uL — ABNORMAL HIGH (ref 3.8–10.8)

## 2023-12-23 LAB — COMPLETE METABOLIC PANEL WITH GFR
AG Ratio: 1.8 (calc) (ref 1.0–2.5)
ALT: 14 U/L (ref 6–29)
AST: 24 U/L (ref 10–35)
Albumin: 4 g/dL (ref 3.6–5.1)
Alkaline phosphatase (APISO): 74 U/L (ref 37–153)
BUN: 11 mg/dL (ref 7–25)
CO2: 28 mmol/L (ref 20–32)
Calcium: 8.9 mg/dL (ref 8.6–10.4)
Chloride: 102 mmol/L (ref 98–110)
Creat: 0.74 mg/dL (ref 0.50–1.05)
Globulin: 2.2 g/dL (ref 1.9–3.7)
Glucose, Bld: 175 mg/dL — ABNORMAL HIGH (ref 65–99)
Potassium: 4 mmol/L (ref 3.5–5.3)
Sodium: 138 mmol/L (ref 135–146)
Total Bilirubin: 0.3 mg/dL (ref 0.2–1.2)
Total Protein: 6.2 g/dL (ref 6.1–8.1)

## 2023-12-23 LAB — HEMOGLOBIN A1C
Hgb A1c MFr Bld: 6.5 %{Hb} — ABNORMAL HIGH (ref <5.7)
Mean Plasma Glucose: 140 mg/dL
eAG (mmol/L): 7.7 mmol/L

## 2023-12-23 LAB — LIPID PANEL
Cholesterol: 167 mg/dL (ref <200)
HDL: 58 mg/dL (ref >=50)
LDL Cholesterol (Calc): 62 mg/dL
Non-HDL Cholesterol (Calc): 109 mg/dL (ref <130)
Total CHOL/HDL Ratio: 2.9 (calc) (ref <5.0)
Triglycerides: 390 mg/dL — ABNORMAL HIGH (ref <150)

## 2023-12-24 ENCOUNTER — Encounter: Payer: Self-pay | Admitting: Family Medicine

## 2023-12-24 LAB — URINE CULTURE
MICRO NUMBER:: 16239150
SPECIMEN QUALITY:: ADEQUATE

## 2023-12-26 DIAGNOSIS — M48062 Spinal stenosis, lumbar region with neurogenic claudication: Secondary | ICD-10-CM | POA: Diagnosis not present

## 2023-12-26 DIAGNOSIS — M5416 Radiculopathy, lumbar region: Secondary | ICD-10-CM | POA: Diagnosis not present

## 2024-01-09 ENCOUNTER — Encounter: Payer: Self-pay | Admitting: Neurosurgery

## 2024-01-12 ENCOUNTER — Other Ambulatory Visit: Payer: Self-pay | Admitting: Family Medicine

## 2024-01-12 DIAGNOSIS — B372 Candidiasis of skin and nail: Secondary | ICD-10-CM

## 2024-01-12 DIAGNOSIS — M48062 Spinal stenosis, lumbar region with neurogenic claudication: Secondary | ICD-10-CM | POA: Diagnosis not present

## 2024-01-12 DIAGNOSIS — M5416 Radiculopathy, lumbar region: Secondary | ICD-10-CM | POA: Diagnosis not present

## 2024-01-13 NOTE — Telephone Encounter (Signed)
 Requested medications are due for refill today.  yes  Requested medications are on the active medications list.  yes  Last refill. 08/01/2022 60 g 1 rf  Future visit scheduled.   yes  Notes to clinic.  Refill not delegated.    Requested Prescriptions  Pending Prescriptions Disp Refills   nystatin (MYCOSTATIN/NYSTOP) powder [Pharmacy Med Name: Nystatin 100000 UNIT/GM External Powder] 60 g 0    Sig: APPLY  POWDER TOPICALLY THREE TIMES DAILY TO AFFECTED AREA AS NEEDED     Off-Protocol Failed - 01/13/2024  3:28 PM      Failed - Medication not assigned to a protocol, review manually.      Failed - Valid encounter within last 12 months    Recent Outpatient Visits           3 weeks ago Dysuria   [facility] Adeline Hone, PA-C       Future Appointments             Tomorrow Tapia, Leisa, PA-C [facility], [facility]

## 2024-01-14 ENCOUNTER — Ambulatory Visit: Admitting: Family Medicine

## 2024-01-19 ENCOUNTER — Encounter: Payer: Self-pay | Admitting: Neurosurgery

## 2024-01-19 ENCOUNTER — Ambulatory Visit: Admitting: Neurosurgery

## 2024-01-19 VITALS — BP 142/76 | Ht 61.0 in | Wt 143.4 lb

## 2024-01-19 DIAGNOSIS — M4712 Other spondylosis with myelopathy, cervical region: Secondary | ICD-10-CM

## 2024-01-19 DIAGNOSIS — F17200 Nicotine dependence, unspecified, uncomplicated: Secondary | ICD-10-CM

## 2024-01-19 DIAGNOSIS — M4802 Spinal stenosis, cervical region: Secondary | ICD-10-CM | POA: Diagnosis not present

## 2024-01-19 DIAGNOSIS — M4726 Other spondylosis with radiculopathy, lumbar region: Secondary | ICD-10-CM

## 2024-01-19 DIAGNOSIS — M4722 Other spondylosis with radiculopathy, cervical region: Secondary | ICD-10-CM

## 2024-01-19 DIAGNOSIS — F17210 Nicotine dependence, cigarettes, uncomplicated: Secondary | ICD-10-CM | POA: Diagnosis not present

## 2024-01-19 DIAGNOSIS — M4316 Spondylolisthesis, lumbar region: Secondary | ICD-10-CM

## 2024-01-19 NOTE — Progress Notes (Signed)
 Referring Physician:  Adeline Hone, PA-C [address] [place],  [place] [address]  Primary Physician:  Adeline Hone, PA-C  History of Present Illness: 01/19/2024 Savannah Washington is here today with a chief complaint of back pain that radiates to the bilateral lower extremities and and neck pain radiating to her left upper extremity.  Last time we saw her we were concerned that she had some hyperreflexia on her physical examination also had grade 1 anterolisthesis, however concern for cervical spondylosis in addition to her lumbar spondylolisthesis.  She continues to have pain and weakness going into her left upper extremity as well as pain weakness going into her back and buttocks..  She has been taking anti-inflammatories, steroids, antinerve pain medications as well as intermittent narcotic pain medication use.  She is not having any bowel or bladder difficulty.  She does feel better if she lays in a recumbent position and she feels worse when she stands upright.  She does smoke, she is interested in stopping.   Conservative measures:  Physical therapy: none for her back Multimodal medical therapy including regular antiinflammatories: meloxicam , prednisone , Lyrica , hydrocodone  Injections:   Bilateral L5-S1 transforaminal epidural injections 09/19/2023-- she describes no relief following epidural steroid injection.   Past Surgery:  no spinal surgeries  The symptoms are causing a significant impact on the patient's life.   I have utilized the care everywhere function in epic to review the outside records available from external health systems.  Review of Systems:  A 10 point review of systems is negative, except for the pertinent positives and negatives detailed in the HPI.  Past Medical History: Past Medical History:  Diagnosis Date   Anxiety    Aortic atherosclerosis (HCC) 12/29/2017   Chest CT March 2019   Cervical arthritis 09/04/2017   xrays Dec 2018    Degenerative disc disease, cervical 09/04/2017   Xrays Dec 2018   Depression    Emphysema lung (HCC) 12/29/2017   Chest CT March 2019   Hepatic steatosis 12/29/2017   Chest CT March 2019   Hyperlipidemia    Hypertension    Prediabetes 03/14/2017    Past Surgical History: Past Surgical History:  Procedure Laterality Date   BREAST BIOPSY Left 07/18/2022   stereo bx, calcs, COIL clip-path pending   XI ROBOTIC LAPAROSCOPIC ASSISTED APPENDECTOMY N/A 08/18/2023   Procedure: XI ROBOTIC LAPAROSCOPIC ASSISTED APPENDECTOMY;  Surgeon: Eldred Grego, MD;  Location: [facility] ORS;  Service: General;  Laterality: N/A;    Allergies: Allergies as of 01/19/2024   (No Known Allergies)    Medications:  Current Outpatient Medications:    albuterol  (PROAIR  HFA) 108 (90 Base) MCG/ACT inhaler, Inhale 2 puffs into the lungs every 6 (six) hours as needed for wheezing or shortness of breath., Disp: 1 each, Rfl: 5   atorvastatin  (LIPITOR) 80 MG tablet, Take 1 tablet (80 mg total) by mouth at bedtime., Disp: 30 tablet, Rfl: 11   buPROPion  (WELLBUTRIN  XL) 300 MG 24 hr tablet, Take 1 tablet (300 mg total) by mouth daily., Disp: 90 tablet, Rfl: 1   cetirizine  (ZYRTEC ) 10 MG tablet, Take 10 mg by mouth daily., Disp: , Rfl:    Fluticasone -Umeclidin-Vilant (TRELEGY ELLIPTA ) 100-62.5-25 MCG/ACT AEPB, Inhale 1 each into the lungs daily., Disp: 60 each, Rfl: 5   HYDROcodone -acetaminophen  (NORCO/VICODIN) 5-325 MG tablet, Take 1 tablet by mouth in the morning and at bedtime., Disp: , Rfl:    lisinopril  (ZESTRIL ) 10 MG tablet, Take 1 tablet (10 mg total) by  mouth daily., Disp: 90 tablet, Rfl: 1   montelukast  (SINGULAIR ) 10 MG tablet, Take 1 tablet (10 mg total) by mouth at bedtime., Disp: 30 tablet, Rfl: 3   Multiple Vitamins-Minerals (MULTI COMPLETE PO), Take 1,000 Units by mouth daily., Disp: , Rfl:    nystatin  (MYCOSTATIN /NYSTOP ) powder, APPLY  POWDER TOPICALLY THREE TIMES DAILY TO AFFECTED AREA AS NEEDED, Disp: 60 g,  Rfl: 0   ondansetron  (ZOFRAN ) 4 MG tablet, Take 1 tablet (4 mg total) by mouth every 8 (eight) hours as needed for nausea or vomiting., Disp: 20 tablet, Rfl: 0   pregabalin  (LYRICA ) 25 MG capsule, Take 1 capsule (25 mg total) by mouth 2 (two) times daily., Disp: 180 capsule, Rfl: 0   Turmeric 500 MG CAPS, Take 500 mg by mouth daily., Disp: , Rfl:    Varenicline  Tartrate, Starter, (CHANTIX  STARTING MONTH PAK) 0.5 MG X 11 & 1 MG X 42 TBPK, Take 0.5 mg tablet by mouth 1x daily for 3 days, then increase to 0.5 mg tablet 2x daily for 4 days, then increase to 1 mg tablet 2x daily., Disp: 42 each, Rfl: 0  Social History: Social History   Tobacco Use   Smoking status: Every Day    Current packs/day: 0.50    Average packs/day: 0.5 packs/day for 44.7 years (22.3 ttl pk-yrs)    Types: Cigarettes    Start date: 08/30/2018   Smokeless tobacco: Never  Vaping Use   Vaping status: Former  Substance Use Topics   Alcohol use: Yes    Alcohol/week: 5.0 standard drinks of alcohol    Types: 1 Glasses of wine, 4 Cans of beer per week   Drug use: No    Family Medical History: Family History  Problem Relation Age of Onset   Dementia Mother    Breast cancer Sister 86   Aneurysm Father    Heart attack Brother    Heart defect Brother    Brain cancer Daughter    Heart attack Paternal Grandfather    Drug abuse Sister     Physical Examination: Vitals:   01/19/24 1114  BP: (!) 142/76    General: Patient is in no apparent distress. Attention to examination is appropriate.  Neck:   Supple.  Full range of motion.  Respiratory: Patient is breathing without any difficulty.   NEUROLOGICAL:     Awake, alert, oriented to person, place, and time.  Speech is clear and fluent.   Cranial Nerves: Pupils equal round and reactive to light.  Facial tone is symmetric.  Facial sensation is symmetric. Shoulder shrug is symmetric. Tongue protrusion is midline.    Strength: Some slight limitations due to pain,  however confrontational strength feels full throughout Side Biceps Triceps Deltoid Interossei Grip Wrist Ext. Wrist Flex.  R 5 5 5 5 5 5 5   L 5 5 5 5 5 5 5    Side Iliopsoas Quads Hamstring PF DF EHL  R 5 5 5 5 5 5   L 5 5 5 5 5 5    Reflexes are hyperreflexic at the bilateral brachioradialis with spreading into the hands, biceps are hyperreflexic as well.  She also has bilateral Hoffmann's left worse than right..  Her patellar reflexes are hyperreflexic, Achilles are decreased significantly.  No evidence of clonus.  Some mild patchy light sensation loss not clear dermatomal distribution     No evidence of dysmetria noted.  Antalgic gait  Imaging: Narrative & Impression  CLINICAL DATA:  Low back pain with bilateral posterior leg pain  extending down to the ankles.   EXAM: MRI LUMBAR SPINE WITHOUT CONTRAST   TECHNIQUE: Multiplanar, multisequence MR imaging of the lumbar spine was performed. No intravenous contrast was administered.   COMPARISON:  None Available.   FINDINGS: Segmentation:  Standard.   Alignment:  L4-5 grade 1 anterolisthesis, degenerative.   Vertebrae:  No fracture, evidence of discitis, or bone lesion.   Conus medullaris and cauda equina: Conus extends to the L1 level. Conus and cauda equina appear normal.   Paraspinal and other soft tissues: Negative for perispinal mass or inflammation. Left colonic diverticulosis   Disc levels:   L3-L4: Disc narrowing and bulging.  No neural compression.   L4-L5: Degenerative facet spurring with anterolisthesis. The disc is narrowed and bulging with advanced thecal sac stenosis. Moderate right foraminal narrowing.   L5-S1:Greatest level of degenerative disc narrowing with endplate and facet spurring. The canal and foramina are patent.   IMPRESSION: Lumbar spine degeneration most notable at L4-5 where there is advanced facet osteoarthritis, anterolisthesis, and compressive spinal stenosis. Moderate right  foraminal narrowing at the same level.     Electronically Signed   By: Ronnette Coke M.D.   On: 08/12/2023 10:04     Narrative & Impression  CLINICAL DATA:  Pain.   EXAM: CERVICAL SPINE - COMPLETE 4 VIEW   COMPARISON:  09/03/2017   FINDINGS: Degenerative changes with disc space narrowing and marginal osteophytes C5-C7. Grade 1 C4 anterolisthesis. No significant motion with flexion and extension to suggest instability. No compression deformities. Prevertebral and cervicocranial soft tissues unremarkable.   IMPRESSION: Degenerative changes. Grade 1 C4 anterolisthesis.     Electronically Signed   By: Sydell Eva M.D.   On: 11/16/2023 10:16   Narrative & Impression  CLINICAL DATA:  Myelopathy, chronic, spondylolisthesis   EXAM: MRI CERVICAL SPINE WITHOUT CONTRAST   TECHNIQUE: Multiplanar, multisequence MR imaging of the cervical spine was performed. No intravenous contrast was administered.   COMPARISON:  MRI of the cervical spine dated 01/07/2018   FINDINGS: Alignment: Grade 1 anterolisthesis of C4 on C5. Mild retrolisthesis of C5 on C6.   Vertebrae: Degenerative endplate marrow changes at a few levels. No acute fracture is identified.   Cord: Normal signal and morphology.   Posterior Fossa, vertebral arteries, paraspinal tissues: The visualized portions of the skull base and the posterior fossa are normal. No soft tissue abnormality is identified.   Disc levels:   C2-C3: The disk is normal in configuration. No facet arthropathy. No uncovertebral joint disease. No neuroforaminal stenosis. No spinal canal stenosis.   C3-C4: The disk is normal in configuration. Severe left and mild right facet arthropathy. No uncovertebral joint disease. No neuroforaminal stenosis. No spinal canal stenosis.   C4-C5: Disc osteophyte complex. Severe bilateral facet arthropathy. Moderate bilateral uncovertebral joint disease. Mild bilateral neuroforaminal stenosis.  No spinal canal stenosis.   C5-C6: Disc osteophyte complex. Severe left and moderate right facet arthropathy. Severe left and moderate right uncovertebral joint disease. Severe left and moderate right neuroforaminal stenosis. Mild spinal canal stenosis.   C6-C7: Disc osteophyte complex. Moderate bilateral facet arthropathy. Severe right and mild left uncovertebral joint disease. Severe right and mild left neuroforaminal stenosis. No spinal canal stenosis.   C7-T1: The disk is normal in configuration. Severe bilateral facet arthropathy. No uncovertebral joint disease. No neuroforaminal stenosis. No spinal canal stenosis.   IMPRESSION: 1. Spondylotic changes in the cervical spine with severe left foraminal stenosis at C5-C6. Severe right foraminal stenosis C6-C7. Findings have progressed since prior examination  in 2019. 2. Mild canal stenosis at C5-C6 secondary to disc osteophyte complex.     Electronically Signed   By: Johnanna Mylar M.D.   On: 01/09/2024 11:38    I have personally reviewed the images and agree with the above interpretation.  Medical Decision Making/Assessment and Plan: Ms. Yokoyama is a pleasant 63 y.o. female with history of chronic back pain radiating into her bilateral lower extremities as well as intermittent neck pain and radiation into her bilateral upper extremities left worse than right.  She continues to have neck pain and low back pain that is quite significant for her.  At her last appointment we did recommend that she quit smoking but she continues to smoke.  We reiterated at this appointment that she would need to quit smoking prior to any surgical intervention.  She has worked with physical therapy.  At this point she continues to have signs and symptoms consistent with a cervical myeloradiculopathy as well as a lumbar spondylosis and radiculopathy.  Her stenosis is worst at the C5-6 level, it appears to have progressed, she also has some right-sided  stenosis at C6-7, and an anterior listhesis at C4-5.  We discussed that she is at risk for a pseudoarthrosis and complication given her smoking history.  Will plan to have her follow-up in a month and a half with tobacco testing to evaluate her for ability to quit to optimize her clinical outcomes as she is at high risk for pseudoarthrosis and subsidence as well as infection.  Thank you for involving me in the care of this patient.   Fetty Clamp MD/MSCR Neurosurgery

## 2024-01-26 ENCOUNTER — Other Ambulatory Visit: Payer: Self-pay | Admitting: Family Medicine

## 2024-01-26 DIAGNOSIS — J449 Chronic obstructive pulmonary disease, unspecified: Secondary | ICD-10-CM

## 2024-01-28 NOTE — Telephone Encounter (Signed)
 OV 12/22/23 Requested Prescriptions  Pending Prescriptions Disp Refills   albuterol  (VENTOLIN  HFA) 108 (90 Base) MCG/ACT inhaler [Pharmacy Med Name: Albuterol  Sulfate HFA 108 (90 Base) MCG/ACT Inhalation Aerosol Solution] 9 g 0    Sig: INHALE 2 PUFFS BY MOUTH EVERY 6 HOURS AS NEEDED FOR WHEEZING OR SHORTNESS OF BREATH     Pulmonology:  Beta Agonists 2 Failed - 01/28/2024 11:55 AM      Failed - Last BP in normal range    BP Readings from Last 1 Encounters:  01/19/24 (!) 142/76         Failed - Valid encounter within last 12 months    Recent Outpatient Visits           1 month ago Dysuria   [facility] [place], Jennie Moeller, PA-C              Passed - Last Heart Rate in normal range    Pulse Readings from Last 1 Encounters:  12/22/23 71

## 2024-03-08 ENCOUNTER — Ambulatory Visit: Payer: Self-pay

## 2024-03-08 NOTE — Telephone Encounter (Signed)
 FYI Only or Action Required?: FYI only for provider  Patient was last seen in primary care on 12/22/2023. Called Nurse Triage reporting Dizziness. Symptoms began several weeks ago. Interventions attempted: Nothing. Symptoms are: gradually worsening.  Triage Disposition: See Physician Within 24 Hours  Patient/caregiver understands and will follow disposition?: Yes    Copied from CRM [id number]. Topic: Clinical - Red Word Triage >> Mar 08, 2024  2:14 PM Elle L wrote: Red Word that prompted transfer to Nurse Triage: The patient has been experiencing extreme vertigo and dizziness along with stress. Reason for Disposition  [1] NO dizziness now AND [2] one or more stroke risk factors (i.e., hypertension, diabetes, prior stroke/TIA/heart attack)  Answer Assessment - Initial Assessment Questions 1. DESCRIPTION: "Describe your dizziness."     When standing straight up, feels nauseous and faint  2. VERTIGO: "Do you feel like either you or the room is spinning or tilting?"      No  3. LIGHTHEADED: "Do you feel lightheaded?" (e.g., somewhat faint, woozy, weak upon standing)     Somewhat faint and woozy  4. SEVERITY: "How bad is it?"  "Can you walk?"   - MILD: Feels slightly dizzy and unsteady, but is walking normally.   - MODERATE: Feels unsteady when walking, but not falling; interferes with normal activities (e.g., school, work).   - SEVERE: Unable to walk without falling, or requires assistance to walk without falling.     Mild to moderate  5. ONSET:  "When did the dizziness begin?"     2 weeks  6. AGGRAVATING FACTORS: "Does anything make it worse?" (e.g., standing, change in head position)     No aggravating factor  7. CAUSE: "What do you think is causing the dizziness?"     Thinks that stress may be related, trying to quit smoking  8. RECURRENT SYMPTOM: "Have you had dizziness before?" If Yes, ask: "When was the last time?" "What happened that time?"     No  9. OTHER SYMPTOMS: "Do you  have any other symptoms?" (e.g., headache, weakness, numbness, vomiting, earache)     Nauseated  10. PREGNANCY: "Is there any chance you are pregnant?" "When was your last menstrual period?"       No  Protocols used: Dizziness - Vertigo-A-AH

## 2024-03-09 ENCOUNTER — Ambulatory Visit: Attending: Family Medicine

## 2024-03-09 ENCOUNTER — Ambulatory Visit: Admitting: Family Medicine

## 2024-03-09 ENCOUNTER — Encounter: Payer: Self-pay | Admitting: Family Medicine

## 2024-03-09 VITALS — BP 132/80 | HR 81 | Resp 16 | Ht 61.0 in | Wt 136.0 lb

## 2024-03-09 DIAGNOSIS — R350 Frequency of micturition: Secondary | ICD-10-CM | POA: Diagnosis not present

## 2024-03-09 DIAGNOSIS — E1142 Type 2 diabetes mellitus with diabetic polyneuropathy: Secondary | ICD-10-CM | POA: Diagnosis not present

## 2024-03-09 DIAGNOSIS — I10 Essential (primary) hypertension: Secondary | ICD-10-CM | POA: Diagnosis not present

## 2024-03-09 DIAGNOSIS — T887XXA Unspecified adverse effect of drug or medicament, initial encounter: Secondary | ICD-10-CM

## 2024-03-09 DIAGNOSIS — R42 Dizziness and giddiness: Secondary | ICD-10-CM | POA: Diagnosis not present

## 2024-03-09 DIAGNOSIS — R110 Nausea: Secondary | ICD-10-CM

## 2024-03-09 LAB — POCT URINALYSIS DIPSTICK
Appearance: NORMAL
Bilirubin, UA: NEGATIVE
Blood, UA: NEGATIVE
Glucose, UA: NEGATIVE
Ketones, UA: NEGATIVE
Leukocytes, UA: NEGATIVE
Nitrite, UA: NEGATIVE
Protein, UA: NEGATIVE
Spec Grav, UA: 1.015 (ref 1.010–1.025)
Urobilinogen, UA: 0.2 U/dL
pH, UA: 6 (ref 5.0–8.0)

## 2024-03-09 NOTE — Progress Notes (Signed)
 Patient ID: Savannah Washington, female    DOB: 01-10-1961, 63 y.o.   MRN: [id number]  PCP: Adeline Hone, PA-C  Chief Complaint  Patient presents with   Dizziness    X2 weeks    Subjective:   Savannah Washington is a 63 y.o. female, presents to clinic with CC of the following:  Dizziness This is a new problem. Episode onset: 2 weeks. The problem has been gradually worsening. Associated symptoms include coughing (chronic "smokers") and nausea. Pertinent negatives include no abdominal pain, chest pain, chills, diaphoresis, fatigue or fever. Exacerbated by: position changes, standing up. She has tried nothing for the symptoms.    Hx of DM, smoking, sedentary, she recently has increase in stress and was trying to stop smoking- she thinks both may be aggravating factors  Lab Results  Component Value Date   HGBA1C 6.5 (H) 12/22/2023   Recent UTI in March No urinary sx  Episodes come randomly sometimes when standing up sometimes when just sitting down, feels like she's going to pass out, some nausea, someteims she will sit down to help the symptoms, sometimes when sitting down and she feels like she's going to pass out she stands up to help her not pass out.  No palpitations, vision changes vomiting, CP, SOB    Patient Active Problem List   Diagnosis Date Noted   Cervical spondylosis with myelopathy and radiculopathy 11/20/2023   Spondylolisthesis at L4-L5 level 11/03/2023   Spinal stenosis, lumbar region, with neurogenic claudication 11/03/2023   Mechanical low back pain 11/03/2023   Neck pain 11/03/2023   Hyperreflexia 11/03/2023   Acute appendicitis with localized peritonitis 08/18/2023   Osteopenia 06/04/2022   Current mild episode of major depressive disorder, unspecified whether recurrent (HCC) 02/12/2021   Type 2 diabetes mellitus with diabetic polyneuropathy, without long-term current use of insulin (HCC) 02/04/2020   Smoking 12/01/2018   Aortic atherosclerosis (HCC)  12/29/2017   Hepatic steatosis 12/29/2017   COPD (chronic obstructive pulmonary disease) (HCC) 12/05/2017   Degenerative disc disease, cervical 09/04/2017   Cervical arthritis 09/04/2017   Muscle spasm of left shoulder area 09/03/2017   Elevated platelet count 04/11/2017   Marijuana use 03/17/2017   Prediabetes 03/14/2017   Essential hypertension, benign 03/14/2017   Hyperlipidemia 03/14/2017   Anxiety disorder 03/14/2017      Current Outpatient Medications:    albuterol  (VENTOLIN  HFA) 108 (90 Base) MCG/ACT inhaler, INHALE 2 PUFFS BY MOUTH EVERY 6 HOURS AS NEEDED FOR WHEEZING OR SHORTNESS OF BREATH, Disp: 9 g, Rfl: 0   ascorbic acid (VITAMIN C) 500 MG tablet, Take 500 mg by mouth daily., Disp: , Rfl:    atorvastatin  (LIPITOR) 80 MG tablet, Take 1 tablet (80 mg total) by mouth at bedtime., Disp: 30 tablet, Rfl: 11   buPROPion  (WELLBUTRIN  XL) 300 MG 24 hr tablet, Take 1 tablet (300 mg total) by mouth daily., Disp: 90 tablet, Rfl: 1   cetirizine  (ZYRTEC ) 10 MG tablet, Take 10 mg by mouth daily., Disp: , Rfl:    Fluticasone -Umeclidin-Vilant (TRELEGY ELLIPTA ) 100-62.5-25 MCG/ACT AEPB, Inhale 1 each into the lungs daily., Disp: 60 each, Rfl: 5   HYDROcodone -acetaminophen  (NORCO/VICODIN) 5-325 MG tablet, Take 1 tablet by mouth in the morning and at bedtime., Disp: , Rfl:    lisinopril  (ZESTRIL ) 10 MG tablet, Take 1 tablet (10 mg total) by mouth daily., Disp: 90 tablet, Rfl: 1   magnesium oxide (MAG-OX) 400 (240 Mg) MG tablet, Take 400 mg by mouth daily., Disp: , Rfl:  montelukast  (SINGULAIR ) 10 MG tablet, Take 1 tablet (10 mg total) by mouth at bedtime., Disp: 30 tablet, Rfl: 3   Multiple Vitamins-Minerals (MULTI COMPLETE PO), Take 1,000 Units by mouth daily., Disp: , Rfl:    nystatin  (MYCOSTATIN /NYSTOP ) powder, APPLY  POWDER TOPICALLY THREE TIMES DAILY TO AFFECTED AREA AS NEEDED, Disp: 60 g, Rfl: 0   ondansetron  (ZOFRAN ) 4 MG tablet, Take 1 tablet (4 mg total) by mouth every 8 (eight) hours  as needed for nausea or vomiting., Disp: 20 tablet, Rfl: 0   Potassium 99 MG TABS, Take 99 mg by mouth daily., Disp: , Rfl:    pregabalin  (LYRICA ) 25 MG capsule, Take 1 capsule (25 mg total) by mouth 2 (two) times daily., Disp: 180 capsule, Rfl: 0   Turmeric 500 MG CAPS, Take 500 mg by mouth daily., Disp: , Rfl:    Varenicline  Tartrate, Starter, (CHANTIX  STARTING MONTH PAK) 0.5 MG X 11 & 1 MG X 42 TBPK, Take 0.5 mg tablet by mouth 1x daily for 3 days, then increase to 0.5 mg tablet 2x daily for 4 days, then increase to 1 mg tablet 2x daily. (Patient not taking: Reported on 03/09/2024), Disp: 42 each, Rfl: 0   No Known Allergies   Social History   Tobacco Use   Smoking status: Every Day    Current packs/day: 0.50    Average packs/day: 0.5 packs/day for 44.8 years (22.4 ttl pk-yrs)    Types: Cigarettes    Start date: 08/30/2018   Smokeless tobacco: Never  Vaping Use   Vaping status: Former  Substance Use Topics   Alcohol use: Yes    Alcohol/week: 5.0 standard drinks of alcohol    Types: 1 Glasses of wine, 4 Cans of beer per week   Drug use: No      Chart Review Today: I personally reviewed active problem list, medication list, allergies, family history, social history, health maintenance, notes from last encounter, lab results, imaging with the patient/caregiver today.   Review of Systems  Constitutional: Negative.  Negative for activity change, appetite change, chills, diaphoresis, fatigue, fever and unexpected weight change.  HENT: Negative.    Eyes: Negative.   Respiratory:  Positive for cough (chronic "smokers"). Negative for chest tightness, shortness of breath, wheezing and stridor.   Cardiovascular: Negative.  Negative for chest pain, palpitations and leg swelling.  Gastrointestinal:  Positive for nausea. Negative for abdominal pain.  Endocrine: Negative.   Genitourinary: Negative.   Musculoskeletal: Negative.   Skin: Negative.   Allergic/Immunologic: Negative.    Neurological:  Positive for dizziness.  Hematological: Negative.   Psychiatric/Behavioral: Negative.    All other systems reviewed and are negative.      Objective:   Vitals:   03/09/24 0837  BP: 132/80  Pulse: 81  Resp: 16  SpO2: 96%  Weight: 136 lb (61.7 kg)  Height: 5\' 1"  (1.549 m)    Body mass index is 25.7 kg/m.  No data found.   Orthostatic VS for the past 72 hrs (Last 3 readings):  Orthostatic BP Patient Position BP Location Cuff Size Orthostatic Pulse  03/09/24 0848 122/84 Standing Right Arm Normal 83  03/09/24 0847 126/82 Sitting Right Arm Normal 78  03/09/24 0837 132/80 Supine Right Arm Normal 81    Orthostatics reviewed and NEG   Physical Exam Vitals and nursing note reviewed.  Constitutional:      General: She is not in acute distress.    Appearance: Normal appearance. She is well-developed. She is not ill-appearing, toxic-appearing  or diaphoretic.  HENT:     Head: Normocephalic and atraumatic.     Right Ear: Tympanic membrane, ear canal and external ear normal. There is no impacted cerumen.     Left Ear: Tympanic membrane, ear canal and external ear normal. There is no impacted cerumen.     Nose: Nose normal.     Mouth/Throat:     Mouth: Mucous membranes are dry.     Pharynx: Posterior oropharyngeal erythema present. No oropharyngeal exudate.  Eyes:     General: No scleral icterus.       Right eye: No discharge.        Left eye: No discharge.     Conjunctiva/sclera: Conjunctivae normal.  Neck:     Trachea: No tracheal deviation.  Cardiovascular:     Rate and Rhythm: Normal rate and regular rhythm.     Pulses: Normal pulses.     Heart sounds: Normal heart sounds. No murmur heard.    No friction rub. No gallop.  Pulmonary:     Effort: Pulmonary effort is normal. No respiratory distress.     Breath sounds: Normal breath sounds. No stridor. No wheezing, rhonchi or rales.  Abdominal:     General: Bowel sounds are normal. There is no distension.      Palpations: Abdomen is soft.     Tenderness: There is no abdominal tenderness. There is no right CVA tenderness or left CVA tenderness.  Musculoskeletal:     Cervical back: Normal range of motion.  Skin:    General: Skin is warm and dry.     Findings: No rash.  Neurological:     Mental Status: She is alert.     Motor: No abnormal muscle tone.     Coordination: Coordination normal.     Gait: Gait normal.     Comments: MENTAL STATUS: AAOx3, memory intact, fund of knowledge appropriate  LANG/SPEECH: Naming and repetition intact, fluent, no dysarthria, follows 3-step commands, answers questions appropriately  CRANIAL NERVES:   II: Pupils equal and reactive, no RAPD   III, IV, VI: EOM intact, no gaze preference or deviation, no nystagmus.   V: normal sensation in V1, V2, and V3 segments bilaterally   VII: no asymmetry, no nasolabial fold flattening   VIII: normal hearing to speech   IX, X: normal palatal elevation, no uvular deviation   XI: 5/5 head turn and 5/5 shoulder shrug bilaterally   XII: midline tongue protrusion  MOTOR:  5/5 bilateral grip strength 5/5 strength dorsiflexion/plantarflexion b/l  SENSORY:  Normal to light touch Romberg absent COORD: Normal finger to nose and heel to shin, no tremor, no dysmetria  STATION: normal stance, no truncal ataxia GAIT: Normal; patient able to tip-toe, heel-walk.    Psychiatric:        Mood and Affect: Mood normal.        Behavior: Behavior normal.      ECG interpretation   Date: 03/09/24  Rate: 77  Rhythm: normal sinus rhythm  QRS Axis: normal  Intervals: normal  ST/T Wave abnormalities: normal  Conduction Disutrbances: none  Narrative Interpretation: NSR, normal ECG  Old EKG Reviewed: No significant changes noted - compared to ecg from 08/19/2023         Assessment & Plan:   Pt is a 63 y/o female with hx of DM, HTN, HLD, COPD current smoker presents with 2 weeks of gradually worsening lightheaded/near  syncope/dizzy episodes, occurs randomly 1-2 x per day, lasts up to 30 min  1.  Dizziness (Primary) Random episodes x 2 weeks getting gradually worse, at least 2x a day, occurs sometimes with position changes sometimes when she is just sitting at rest, no associated sx other than she feels like she may pass out/give out and has some nausea and weakness but no vision changes, palpitations, SOB, CP, glucose and pulse ox has been normal when she has checked at home when symptomatic. ECG reassuring today Will check some basic labs Possibly a medication SE - hydrocodone  Pt well appearing today - non-focal neuro and ortho statics negative Encouraged her to keep cell phone close to her and low threshold to call 911 if she has a bad episode or thinks she will pass out, reviewed ER precautions  - EKG 12-Lead - LONG TERM MONITOR (3-14 DAYS) - Comprehensive metabolic panel with GFR - Urine Culture - TSH - CBC with Differential/Platelet  2. Lightheaded ECG reassuring, orthostatics negative, she has checked pulse ox and glucose at home with episodes and nothing has been abnormal.   - EKG 12-Lead - LONG TERM MONITOR (3-14 DAYS) - Comprehensive metabolic panel with GFR - Urine Culture - CBC with Differential/Platelet  3. Nausea With episodes, no vomiting  4. Type 2 diabetes mellitus with diabetic polyneuropathy, without long-term current use of insulin (HCC) Recheck labs that are due and DM foot exam done today Diabetic Foot Exam - Simple   Simple Foot Form Diabetic Foot exam was performed with the following findings: Yes 03/09/2024  9:20 AM  Visual Inspection No deformities, no ulcerations, no other skin breakdown bilaterally: Yes Sensation Testing Intact to touch and monofilament testing bilaterally: Yes Pulse Check Posterior Tibialis and Dorsalis pulse intact bilaterally: Yes Comments    - Comprehensive metabolic panel with GFR - Microalbumin / creatinine urine ratio  5. Essential  hypertension, benign BP well controlled on lisinopril , BP acceptable today with her sx and with increased stress.  Continue lisinopril  10 for now - Comprehensive metabolic panel with GFR  6. Urinary frequency She says from increased water intake, recent UTI will r/o recurrent UTI No CVA ttp or abd tenderness With sx will r/o UTI - Urine Culture  7. Medication side effect Recently got hydrocodone  she wonders if the pain meds are causing sx, she will try lower doses and tracking meds and sx   will be checking electrolytes, renal function, blood counts, r/o UTI, do holter monitor, cut back or hold narcotics and close f/up with results. Orthostatics, ECG and cardiac exam reassuring - with episodes there are no exertional sx and no other associated cardiac sx (no palpitations, SOB, diaphoresis, CP) but since episodes are intermittent and seemingly random holter monitor would further eval and r/o other cardiac etiologies of sx Neuro exam today was w/o deficit - unlikely stroke and sx sound more like near syncope than vertigo   Return for 2-3 week f/up is sx not resolved/improved.   Adeline Hone, PA-C 03/09/24 9:03 AM

## 2024-03-10 ENCOUNTER — Ambulatory Visit: Payer: Self-pay | Admitting: Family Medicine

## 2024-03-10 LAB — COMPREHENSIVE METABOLIC PANEL WITH GFR
AG Ratio: 1.8 (calc) (ref 1.0–2.5)
ALT: 16 U/L (ref 6–29)
AST: 31 U/L (ref 10–35)
Albumin: 4.4 g/dL (ref 3.6–5.1)
Alkaline phosphatase (APISO): 64 U/L (ref 37–153)
BUN: 9 mg/dL (ref 7–25)
CO2: 31 mmol/L (ref 20–32)
Calcium: 9.5 mg/dL (ref 8.6–10.4)
Chloride: 101 mmol/L (ref 98–110)
Creat: 0.75 mg/dL (ref 0.50–1.05)
Globulin: 2.4 g/dL (ref 1.9–3.7)
Glucose, Bld: 127 mg/dL — ABNORMAL HIGH (ref 65–99)
Potassium: 5.2 mmol/L (ref 3.5–5.3)
Sodium: 138 mmol/L (ref 135–146)
Total Bilirubin: 0.5 mg/dL (ref 0.2–1.2)
Total Protein: 6.8 g/dL (ref 6.1–8.1)
eGFR: 89 mL/min/{1.73_m2} (ref >=60)

## 2024-03-10 LAB — URINE CULTURE
MICRO NUMBER:: 16562686
Result:: NO GROWTH
SPECIMEN QUALITY:: ADEQUATE

## 2024-03-10 LAB — CBC WITH DIFFERENTIAL/PLATELET
Absolute Lymphocytes: 1902 {cells}/uL (ref 850–3900)
Absolute Monocytes: 582 {cells}/uL (ref 200–950)
Basophils Absolute: 78 {cells}/uL (ref 0–200)
Basophils Relative: 1.3 %
Eosinophils Absolute: 258 {cells}/uL (ref 15–500)
Eosinophils Relative: 4.3 %
HCT: 47.6 % — ABNORMAL HIGH (ref 35.0–45.0)
Hemoglobin: 15.2 g/dL (ref 11.7–15.5)
MCH: 30.3 pg (ref 27.0–33.0)
MCHC: 31.9 g/dL — ABNORMAL LOW (ref 32.0–36.0)
MCV: 95 fL (ref 80.0–100.0)
MPV: 8.8 fL (ref 7.5–12.5)
Monocytes Relative: 9.7 %
Neutro Abs: 3180 {cells}/uL (ref 1500–7800)
Neutrophils Relative %: 53 %
Platelets: 424 10*3/uL — ABNORMAL HIGH (ref 140–400)
RBC: 5.01 10*6/uL (ref 3.80–5.10)
RDW: 13.3 % (ref 11.0–15.0)
Total Lymphocyte: 31.7 %
WBC: 6 10*3/uL (ref 3.8–10.8)

## 2024-03-10 LAB — MICROALBUMIN / CREATININE URINE RATIO
Creatinine, Urine: 108 mg/dL (ref 20–275)
Microalb Creat Ratio: 5 mg/g{creat} (ref <30)
Microalb, Ur: 0.5 mg/dL

## 2024-03-10 LAB — TSH: TSH: 1.71 m[IU]/L (ref 0.40–4.50)

## 2024-03-16 ENCOUNTER — Ambulatory Visit: Payer: Self-pay

## 2024-03-16 NOTE — Telephone Encounter (Signed)
 FYI Only or Action Required?: FYI only for provider  Patient was last seen in primary care on 03/09/24. Called Nurse Triage reporting Dizziness. Symptoms began several weeks ago. Interventions attempted: Rest, hydration, or home remedies and Other: decreased pain med. Symptoms are: unchanged.  Triage Disposition: See PCP When Office is Open (Within 3 Days)  Patient/caregiver understands and will follow disposition?: Yes  Copied from CRM [id number]. Topic: Clinical - Medical Advice >> Mar 16, 2024  9:09 AM Savannah Washington wrote: Reason for CRM:  She is having dizzy spells. She thought if was from a medication and she stopped medication and still has dizzy spells. She is wearing a heart monitor. During dizzy spells - she does get hot; it comes on fast and she feels like she is going to hit the floor. Reason for Disposition  [1] MODERATE dizziness (e.g., interferes with normal activities) AND [2] has been evaluated by doctor (or NP/PA) for this  Answer Assessment - Initial Assessment Questions 1. DESCRIPTION: Describe your dizziness.     Lightheaded, feels like she will pass out.  2. LIGHTHEADED: Do you feel lightheaded? (e.g., somewhat faint, woozy, weak upon standing)     Feels faint intermittent 3. VERTIGO: Do you feel like either you or the room is spinning or tilting? (i.e. vertigo)     No 4. SEVERITY: How bad is it?  Do you feel like you are going to faint? Can you stand and walk?   - MILD: Feels slightly dizzy, but walking normally.   - MODERATE: Feels unsteady when walking, but not falling; interferes with normal activities (e.g., school, work).   - SEVERE: Unable to walk without falling, or requires assistance to walk without falling; feels like passing out now.      moderate 5. ONSET:  When did the dizziness begin?     persistent 6. AGGRAVATING FACTORS: Does anything make it worse? (e.g., standing, change in head position)     Early day 7. HEART RATE: Can you tell me  your heart rate? How many beats in 15 seconds?  (Note: not all patients can do this)       Baseline 8. CAUSE: What do you think is causing the dizziness?     Unsure 9. RECURRENT SYMPTOM: Have you had dizziness before? If Yes, ask: When was the last time? What happened that time?     Unsure 10. OTHER SYMPTOMS: Do you have any other symptoms? (e.g., fever, chest pain, vomiting, diarrhea, bleeding)       denies Additional info: stopped pain med due dizziness, dizziness not resolved.  Protocols used: Dizziness - Lightheadedness-A-AH

## 2024-03-19 ENCOUNTER — Ambulatory Visit: Payer: Self-pay

## 2024-03-19 ENCOUNTER — Ambulatory Visit: Admitting: Family Medicine

## 2024-03-19 NOTE — Telephone Encounter (Signed)
 FYI Only or Action Required?: Action required by provider: clinical question for provider.  Patient was last seen in primary care on 03/09/24. Called Nurse Triage reporting Respiratory Distress. Symptoms began today. Interventions attempted: Prescription medications: pt's inhalers. Symptoms are: stable.  Triage Disposition: See PCP Within 2 Weeks  Patient/caregiver understands and will follow disposition?: Yes  Copied from CRM [id number]. Topic: Clinical - Medication Question >> Mar 19, 2024 11:53 AM Sanjuana Crutch wrote: Reason for CRM: patient wants to speak with nurse regarding her heart monitor and being prescribed a medication for her breathing. Reason for Disposition  [1] MILD longstanding difficulty breathing AND [2]  SAME as normal  Answer Assessment - Initial Assessment Questions Pt calling because she had increased SOB this morning upon getting up and when she went outside. Pt states starting to have relief with breathing and feels better now that she took her inhalers this morning and has been resting back inside. Pt states has been up since 4AM and had to keep going to bathroom because she had a bad raw hamburger the day before causing her to need to cancel today's MD appmt. Nurse offered to assist in reschedule of appmt, pt states will call office back on Monday to reschedule.  Pt would also like to know how long she will to be having to wear her heart monitor for. PT would also like to ask PCP if she could try Nicotine  patches to help quit smoking because she does not want to use Nicotine  pills anymore.   1. RESPIRATORY STATUS: Describe your breathing? (e.g., wheezing, shortness of breath, unable to speak, severe coughing)      SOB upon getting up this morning but can breathe, SOB when she went outside  2. ONSET: When did this breathing problem begin?      today 3. PATTERN Does the difficult breathing come and go, or has it been constant since it started?      This morning was more  SOB 4. SEVERITY: How bad is your breathing? (e.g., mild, moderate, severe)    - MILD: No SOB at rest, mild SOB with walking, speaks normally in sentences, can lie down, no retractions, pulse < 100.    - MODERATE: SOB at rest, SOB with minimal exertion and prefers to sit, cannot lie down flat, speaks in phrases, mild retractions, audible wheezing, pulse 100-120.    - SEVERE: Very SOB at rest, speaks in single words, struggling to breathe, sitting hunched forward, retractions, pulse > 120      mild 5. RECURRENT SYMPTOM: Have you had difficulty breathing before? If Yes, ask: When was the last time? and What happened that time?      Yes, needs to quit smoking 6. CARDIAC HISTORY: Do you have any history of heart disease? (e.g., heart attack, angina, bypass surgery, angioplasty)      HTN 7. LUNG HISTORY: Do you have any history of lung disease?  (e.g., pulmonary embolus, asthma, emphysema)     COPD 8. CAUSE: What do you think is causing the breathing problem?      No, but Hx of smoking and COPD 9. OTHER SYMPTOMS: Do you have any other symptoms? (e.g., dizziness, runny nose, cough, chest pain, fever)     Lightheaded last couple weeks 10. O2 SATURATION MONITOR:  Do you use an oxygen saturation monitor (pulse oximeter) at home? If Yes, ask: What is your reading (oxygen level) today? What is your usual oxygen saturation reading? (e.g., 95%)  O2 sats of 97% just now 12. TRAVEL: Have you traveled out of the country in the last month? (e.g., travel history, exposures)       no  Protocols used: Breathing Difficulty-A-AH

## 2024-03-19 NOTE — Telephone Encounter (Signed)
 Pt called to ensure she was ok, and if anything got worse to be checked out.

## 2024-03-24 NOTE — Telephone Encounter (Signed)
 Copied from CRM [id number]. Topic: Appointments - Scheduling Inquiry for Clinic >> Mar 23, 2024  3:09 PM Willma R wrote: Reason for CRM: Patient calling to reschedule her appointment that she was supposed to come on 03/19/24. States she is just following up, dizziness symptom is getting better, offered next available appointment and patient is requesting to be seen next week on MTF. Would like a call back.  Patient can reached at [phone]

## 2024-03-24 NOTE — Telephone Encounter (Signed)
 Called patient and lvm

## 2024-03-24 NOTE — Telephone Encounter (Signed)
 Our Zio monitors are ordered for 14 d standard - so that is the default time If they fall off earlier the report will be done on what time they were able to wear it

## 2024-03-24 NOTE — Telephone Encounter (Signed)
 Called pt to get her scheduled and she no longer needs an appt.She would like a call back about the heart monitor.

## 2024-03-30 LAB — OPHTHALMOLOGY REPORT-SCANNED

## 2024-04-02 ENCOUNTER — Other Ambulatory Visit: Payer: Self-pay | Admitting: Family Medicine

## 2024-04-02 DIAGNOSIS — J449 Chronic obstructive pulmonary disease, unspecified: Secondary | ICD-10-CM

## 2024-04-05 ENCOUNTER — Other Ambulatory Visit: Payer: Self-pay

## 2024-04-05 ENCOUNTER — Telehealth: Payer: Self-pay

## 2024-04-05 DIAGNOSIS — J449 Chronic obstructive pulmonary disease, unspecified: Secondary | ICD-10-CM

## 2024-04-05 MED ORDER — ALBUTEROL SULFATE HFA 108 (90 BASE) MCG/ACT IN AERS: 2.0000 | INHALATION_SPRAY | Freq: Four times a day (QID) | RESPIRATORY_TRACT | 0 refills | Status: DC | PRN

## 2024-04-05 NOTE — Telephone Encounter (Signed)
 Refill sent.

## 2024-04-05 NOTE — Telephone Encounter (Signed)
Refill request on albuterol (VENTOLIN HFA) 108 (90 Base) MCG/ACT inhaler.

## 2024-04-06 DIAGNOSIS — R42 Dizziness and giddiness: Secondary | ICD-10-CM | POA: Diagnosis not present

## 2024-04-06 NOTE — Telephone Encounter (Signed)
 Requested Prescriptions  Refused Prescriptions Disp Refills   VENTOLIN  HFA 108 (90 Base) MCG/ACT inhaler [Pharmacy Med Name: Ventolin  HFA 108 (90 Base) MCG/ACT Inhalation Aerosol Solution] 18 g 0    Sig: INHALE 2 PUFFS BY MOUTH EVERY 6 HOURS AS NEEDED FOR WHEEZING OR SHORTNESS OF BREATH. APPOINTMENT NEEDED FOR FURTHER REFILLS.     Pulmonology:  Beta Agonists 2 Failed - 04/06/2024 11:27 AM      Failed - Valid encounter within last 12 months    Recent Outpatient Visits           4 weeks ago Dizziness   [facility] Leavy Mole, PA-C   3 months ago Dysuria   [facility] Leavy Mole, [place]              Passed - Last BP in normal range    BP Readings from Last 1 Encounters:  03/09/24 132/80         Passed - Last Heart Rate in normal range    Pulse Readings from Last 1 Encounters:  03/09/24 81

## 2024-04-12 DIAGNOSIS — R42 Dizziness and giddiness: Secondary | ICD-10-CM

## 2024-04-13 DIAGNOSIS — H5213 Myopia, bilateral: Secondary | ICD-10-CM | POA: Diagnosis not present

## 2024-04-26 DIAGNOSIS — M48062 Spinal stenosis, lumbar region with neurogenic claudication: Secondary | ICD-10-CM | POA: Diagnosis not present

## 2024-04-26 DIAGNOSIS — M5416 Radiculopathy, lumbar region: Secondary | ICD-10-CM | POA: Diagnosis not present

## 2024-04-26 DIAGNOSIS — M5380 Other specified dorsopathies, site unspecified: Secondary | ICD-10-CM | POA: Diagnosis not present

## 2024-04-26 DIAGNOSIS — Z79899 Other long term (current) drug therapy: Secondary | ICD-10-CM | POA: Diagnosis not present

## 2024-05-01 ENCOUNTER — Other Ambulatory Visit: Payer: Self-pay | Admitting: Family Medicine

## 2024-05-01 DIAGNOSIS — J449 Chronic obstructive pulmonary disease, unspecified: Secondary | ICD-10-CM

## 2024-05-03 NOTE — Telephone Encounter (Signed)
 Requested Prescriptions  Pending Prescriptions Disp Refills   VENTOLIN  HFA 108 (90 Base) MCG/ACT inhaler [Pharmacy Med Name: Ventolin  HFA 108 (90 Base) MCG/ACT Inhalation Aerosol Solution] 18 g 1    Sig: INHALE 2 PUFFS BY MOUTH EVERY 6 HOURS AS NEEDED FOR WHEEZING OR SHORTNESS OF BREATH. APPOINTMENT NEEDED FOR FURTHER REFILLS.     Pulmonology:  Beta Agonists 2 Failed - 05/03/2024  4:35 PM      Failed - Valid encounter within last 12 months    Recent Outpatient Visits           1 month ago Dizziness   [facility] Leavy Mole, PA-C   4 months ago Dysuria   [facility] Leavy Mole, [place]              Passed - Last BP in normal range    BP Readings from Last 1 Encounters:  03/09/24 132/80         Passed - Last Heart Rate in normal range    Pulse Readings from Last 1 Encounters:  03/09/24 81

## 2024-05-19 ENCOUNTER — Other Ambulatory Visit: Payer: Self-pay

## 2024-05-19 DIAGNOSIS — E782 Mixed hyperlipidemia: Secondary | ICD-10-CM

## 2024-05-19 DIAGNOSIS — I700 Atherosclerosis of aorta: Secondary | ICD-10-CM

## 2024-05-19 DIAGNOSIS — Z5181 Encounter for therapeutic drug level monitoring: Secondary | ICD-10-CM

## 2024-05-19 MED ORDER — ATORVASTATIN CALCIUM 80 MG PO TABS: 80.0000 mg | ORAL_TABLET | Freq: Every day | ORAL | 11 refills | Status: DC

## 2024-05-24 ENCOUNTER — Ambulatory Visit: Admitting: Family Medicine

## 2024-05-27 ENCOUNTER — Other Ambulatory Visit: Payer: Self-pay | Admitting: Family Medicine

## 2024-05-27 DIAGNOSIS — Z87891 Personal history of nicotine dependence: Secondary | ICD-10-CM

## 2024-05-27 DIAGNOSIS — B372 Candidiasis of skin and nail: Secondary | ICD-10-CM

## 2024-05-27 DIAGNOSIS — F418 Other specified anxiety disorders: Secondary | ICD-10-CM

## 2024-05-28 NOTE — Telephone Encounter (Signed)
 Requested medications are due for refill today.  unsure  Requested medications are on the active medications list.  yes  Last refill. 01/13/2024 60 g 0 rf  Future visit scheduled.   no  Notes to clinic.  Medication not assigned to a protocol. Please review for refill.    Requested Prescriptions  Pending Prescriptions Disp Refills   nystatin  (MYCOSTATIN /NYSTOP ) powder [Pharmacy Med Name: Nystatin  100000 UNIT/GM External Powder] 60 g 0    Sig: APPLY TOPICALLY TO AFFECTED AREA THREE TIMES DAILY AS NEEDED     Off-Protocol Failed - 05/28/2024  2:52 PM      Failed - Medication not assigned to a protocol, review manually.      Failed - Valid encounter within last 12 months    Recent Outpatient Visits           2 months ago Dizziness   [facility] Leavy Mole, [place]   5 months ago Dysuria   [facility] Leavy Mole, PA-C              Refused Prescriptions Disp Refills   buPROPion  (WELLBUTRIN  XL) 300 MG 24 hr tablet [Pharmacy Med Name: buPROPion  HCl ER (XL) 300 MG Oral Tablet Extended Release 24 Hour] 30 tablet 0    Sig: Take 1 tablet by mouth once daily     Psychiatry: Antidepressants - bupropion  Failed - 05/28/2024  2:52 PM      Failed - Valid encounter within last 6 months    Recent Outpatient Visits           2 months ago Dizziness   [facility] Leavy Mole, PA-C   5 months ago Dysuria   [facility] Leavy Mole, [place]              Passed - Cr in normal range and within 360 days    Creat  Date Value Ref Range Status  03/09/2024 0.75 0.50 - 1.05 mg/dL Final   Creatinine, Urine  Date Value Ref Range Status  03/09/2024 108 20 - 275 mg/dL Final         Passed - AST in normal range and within 360 days    AST  Date Value Ref Range Status  03/09/2024 31 10 - 35 U/L Final   SGOT(AST)  Date Value Ref Range Status  11/25/2011 22 15 - 37 Unit/L Final          Passed - ALT in normal range and within 360 days    ALT  Date Value Ref Range Status  03/09/2024 16 6 - 29 U/L Final   SGPT (ALT)  Date Value Ref Range Status  11/25/2011 19 U/L Final    Comment:    12-78 NOTE: NEW REFERENCE RANGE 08/23/2011          Passed - Completed PHQ-2 or PHQ-9 in the last 360 days      Passed - Last BP in normal range    BP Readings from Last 1 Encounters:  03/09/24 132/80

## 2024-05-28 NOTE — Telephone Encounter (Signed)
 Requested Prescriptions  Pending Prescriptions Disp Refills   nystatin  (MYCOSTATIN /NYSTOP ) powder [Pharmacy Med Name: Nystatin  100000 UNIT/GM External Powder] 60 g 0    Sig: APPLY TOPICALLY TO AFFECTED AREA THREE TIMES DAILY AS NEEDED     Off-Protocol Failed - 05/28/2024  2:52 PM      Failed - Medication not assigned to a protocol, review manually.      Failed - Valid encounter within last 12 months    Recent Outpatient Visits           2 months ago Dizziness   [place] [facility] Leavy Mole, PA-C   5 months ago Dysuria   [facility] Leavy Mole, PA-C              Refused Prescriptions Disp Refills   buPROPion  (WELLBUTRIN  XL) 300 MG 24 hr tablet [Pharmacy Med Name: buPROPion  HCl ER (XL) 300 MG Oral Tablet Extended Release 24 Hour] 30 tablet 0    Sig: Take 1 tablet by mouth once daily     Psychiatry: Antidepressants - bupropion  Failed - 05/28/2024  2:52 PM      Failed - Valid encounter within last 6 months    Recent Outpatient Visits           2 months ago Dizziness   [facility] Leavy Mole, PA-C   5 months ago Dysuria   [facility] Leavy [place]              Passed - Cr in normal range and within 360 days    Creat  Date Value Ref Range Status  03/09/2024 0.75 0.50 - 1.05 mg/dL Final   Creatinine, Urine  Date Value Ref Range Status  03/09/2024 108 20 - 275 mg/dL Final         Passed - AST in normal range and within 360 days    AST  Date Value Ref Range Status  03/09/2024 31 10 - 35 U/L Final   SGOT(AST)  Date Value Ref Range Status  11/25/2011 22 15 - 37 Unit/L Final         Passed - ALT in normal range and within 360 days    ALT  Date Value Ref Range Status  03/09/2024 16 6 - 29 U/L Final   SGPT (ALT)  Date Value Ref Range Status  11/25/2011 19 U/L Final    Comment:    12-78 NOTE: NEW REFERENCE RANGE 08/23/2011          Passed -  Completed PHQ-2 or PHQ-9 in the last 360 days      Passed - Last BP in normal range    BP Readings from Last 1 Encounters:  03/09/24 132/80

## 2024-05-28 NOTE — Telephone Encounter (Signed)
 Requested Prescriptions  Pending Prescriptions Disp Refills   buPROPion  (WELLBUTRIN  XL) 300 MG 24 hr tablet [Pharmacy Med Name: buPROPion  HCl ER (XL) 300 MG Oral Tablet Extended Release 24 Hour] 90 tablet 0    Sig: Take 1 tablet by mouth once daily     Psychiatry: Antidepressants - bupropion  Failed - 05/28/2024  2:48 PM      Failed - Valid encounter within last 6 months    Recent Outpatient Visits           2 months ago Dizziness   [facility] Leavy Mole, [facility]-[place]   5 months ago Dysuria   [facility] Leavy Mole, [place]              Passed - Cr in normal range and within 360 days    Creat  Date Value Ref Range Status  03/09/2024 0.75 0.50 - 1.05 mg/dL Final   Creatinine, Urine  Date Value Ref Range Status  03/09/2024 108 20 - 275 mg/dL Final         Passed - AST in normal range and within 360 days    AST  Date Value Ref Range Status  03/09/2024 31 10 - 35 U/L Final   SGOT(AST)  Date Value Ref Range Status  11/25/2011 22 15 - 37 Unit/L Final         Passed - ALT in normal range and within 360 days    ALT  Date Value Ref Range Status  03/09/2024 16 6 - 29 U/L Final   SGPT (ALT)  Date Value Ref Range Status  11/25/2011 19 U/L Final    Comment:    12-78 NOTE: NEW REFERENCE RANGE 08/23/2011          Passed - Completed PHQ-2 or PHQ-9 in the last 360 days      Passed - Last BP in normal range    BP Readings from Last 1 Encounters:  03/09/24 132/80

## 2024-06-28 DIAGNOSIS — M5416 Radiculopathy, lumbar region: Secondary | ICD-10-CM | POA: Diagnosis not present

## 2024-07-21 ENCOUNTER — Encounter: Payer: Self-pay | Admitting: Family Medicine

## 2024-07-26 DIAGNOSIS — M5416 Radiculopathy, lumbar region: Secondary | ICD-10-CM | POA: Diagnosis not present

## 2024-08-02 ENCOUNTER — Other Ambulatory Visit: Payer: Self-pay | Admitting: Family Medicine

## 2024-08-02 DIAGNOSIS — M5416 Radiculopathy, lumbar region: Secondary | ICD-10-CM | POA: Diagnosis not present

## 2024-08-02 DIAGNOSIS — J449 Chronic obstructive pulmonary disease, unspecified: Secondary | ICD-10-CM

## 2024-08-03 NOTE — Telephone Encounter (Signed)
 OFFICE VISIT NEEDED FOR ADDITIONAL REFILLS   Requested Prescriptions  Pending Prescriptions Disp Refills   VENTOLIN  HFA 108 (90 Base) MCG/ACT inhaler [Pharmacy Med Name: Ventolin  HFA 108 (90 Base) MCG/ACT Inhalation Aerosol Solution] 18 g 0    Sig: INHALE 2 PUFFS BY MOUTH EVERY 6 HOURS AS NEEDED FOR WHEEZING OR SHORTNESS OF BREATH. APPOINTMENT NEEDED FOR REFILLS.     Pulmonology:  Beta Agonists 2 Failed - 08/03/2024  4:28 PM      Failed - Valid encounter within last 12 months    Recent Outpatient Visits           4 months ago Dizziness   [facility] Leavy Mole, PA-C   7 months ago Dysuria   [facility] Leavy Mole, PA-C              Passed - Last BP in normal range    BP Readings from Last 1 Encounters:  03/09/24 132/80         Passed - Last Heart Rate in normal range    Pulse Readings from Last 1 Encounters:  03/09/24 81

## 2024-08-09 ENCOUNTER — Ambulatory Visit
Admission: RE | Admit: 2024-08-09 | Discharge: 2024-08-09 | Disposition: A | Discharge status: Home / Self Care | Payer: Self-pay | Source: Ambulatory Visit | Attending: Family Medicine | Admitting: Family Medicine

## 2024-08-09 DIAGNOSIS — Z1231 Encounter for screening mammogram for malignant neoplasm of breast: Secondary | ICD-10-CM | POA: Insufficient documentation

## 2024-08-18 DIAGNOSIS — M5416 Radiculopathy, lumbar region: Secondary | ICD-10-CM | POA: Diagnosis not present

## 2024-08-23 ENCOUNTER — Telehealth: Payer: Self-pay | Admitting: Family Medicine

## 2024-08-23 DIAGNOSIS — M5416 Radiculopathy, lumbar region: Secondary | ICD-10-CM | POA: Diagnosis not present

## 2024-08-23 DIAGNOSIS — J449 Chronic obstructive pulmonary disease, unspecified: Secondary | ICD-10-CM

## 2024-08-23 MED ORDER — VENTOLIN HFA 108 (90 BASE) MCG/ACT IN AERS: 1.0000 | INHALATION_SPRAY | Freq: Four times a day (QID) | RESPIRATORY_TRACT | 0 refills | Status: AC | PRN

## 2024-08-23 NOTE — Telephone Encounter (Signed)
VENTOLIN HFA 108 (90 Base) MCG/ACT inhaler 

## 2024-08-23 NOTE — Telephone Encounter (Signed)
 Needs f/u appt

## 2024-08-23 NOTE — Telephone Encounter (Signed)
 Appt sch'd for 12/1

## 2024-08-30 ENCOUNTER — Ambulatory Visit: Admitting: Internal Medicine

## 2024-08-30 ENCOUNTER — Encounter: Payer: Self-pay | Admitting: Internal Medicine

## 2024-08-30 ENCOUNTER — Other Ambulatory Visit: Payer: Self-pay

## 2024-08-30 VITALS — BP 130/80 | HR 92 | Temp 98.0°F | Resp 16 | Ht 61.0 in | Wt 134.6 lb

## 2024-08-30 DIAGNOSIS — M4722 Other spondylosis with radiculopathy, cervical region: Secondary | ICD-10-CM

## 2024-08-30 DIAGNOSIS — R7303 Prediabetes: Secondary | ICD-10-CM

## 2024-08-30 DIAGNOSIS — F418 Other specified anxiety disorders: Secondary | ICD-10-CM | POA: Diagnosis not present

## 2024-08-30 DIAGNOSIS — I700 Atherosclerosis of aorta: Secondary | ICD-10-CM

## 2024-08-30 DIAGNOSIS — E782 Mixed hyperlipidemia: Secondary | ICD-10-CM | POA: Diagnosis not present

## 2024-08-30 DIAGNOSIS — Z1211 Encounter for screening for malignant neoplasm of colon: Secondary | ICD-10-CM

## 2024-08-30 DIAGNOSIS — Z5181 Encounter for therapeutic drug level monitoring: Secondary | ICD-10-CM

## 2024-08-30 DIAGNOSIS — I10 Essential (primary) hypertension: Secondary | ICD-10-CM | POA: Diagnosis not present

## 2024-08-30 DIAGNOSIS — M4712 Other spondylosis with myelopathy, cervical region: Secondary | ICD-10-CM | POA: Diagnosis not present

## 2024-08-30 DIAGNOSIS — J449 Chronic obstructive pulmonary disease, unspecified: Secondary | ICD-10-CM

## 2024-08-30 LAB — POCT GLYCOSYLATED HEMOGLOBIN (HGB A1C): Hemoglobin A1C: 6 % — AB (ref 4.0–5.6)

## 2024-08-30 MED ORDER — PREGABALIN 25 MG PO CAPS: 25.0000 mg | ORAL_CAPSULE | Freq: Two times a day (BID) | ORAL | 1 refills | Status: AC

## 2024-08-30 MED ORDER — FLUTICASONE-UMECLIDIN-VILANT 100-62.5-25 MCG/ACT IN AEPB: 1.0000 | INHALATION_SPRAY | Freq: Every day | RESPIRATORY_TRACT | 5 refills | Status: AC

## 2024-08-30 MED ORDER — BUPROPION HCL ER (XL) 300 MG PO TB24: 300.0000 mg | ORAL_TABLET | Freq: Every day | ORAL | 1 refills | Status: AC

## 2024-08-30 MED ORDER — LISINOPRIL 10 MG PO TABS: 10.0000 mg | ORAL_TABLET | Freq: Every day | ORAL | 1 refills | Status: AC

## 2024-08-30 MED ORDER — ATORVASTATIN CALCIUM 80 MG PO TABS: 80.0000 mg | ORAL_TABLET | Freq: Every day | ORAL | 1 refills | Status: AC

## 2024-08-30 NOTE — Progress Notes (Signed)
 Established Patient Office Visit  Subjective   Patient ID: Savannah Washington, female    DOB: 1960/12/12  Age: 63 y.o. MRN: [id number]  Chief Complaint  Patient presents with   Medical Management of Chronic Issues    HPI  Patient is here for follow up on chronic medical conditions. She is new to me.   Discussed the use of AI scribe software for clinical note transcription with the patient, who gave verbal consent to proceed.  History of Present Illness Savannah Washington is a 63 year old female with type 2 diabetes, hypertension, hyperlipidemia, COPD, and chronic pain who presents for a follow-up visit and medication refills.  Uncertain is this is type 2 diabetes vs prediabetes -  A1c of 6.5% in March 2025 blood sugars 175 mg/dL but unknown if she was fasting, improved to 127 mg/dL by June 7974. She is having her A1c rechecked today.  Her hypertension is treated with lisinopril  10 mg daily without adverse effects.  Her hyperlipidemia is treated with atorvastatin  80 mg, with an LDL of 62 mg/dL in March 7974.  She has COPD treated with Trelegy and albuterol  inhalers. She has a persistent cough and exertional shortness of breath with activities like housecleaning. She uses albuterol  twice daily.  She takes Wellbutrin  for mood following the death of her daughter. This helps her maintain motivation and energy. She is no longer taking Xanax.  She takes Lyrica  25 mg twice daily for neck and low back pain from pinched nerves and degenerative disc disease. She is in physical therapy with partial relief but still describes the pain as unbearable at times.  She smokes and is trying to quit, and she recognizes that smoking worsens her COPD symptoms.   Hypertension: -Medications: Lisinopril  10 mg  -Patient is compliant with above medications and reports no side effects. -Denies any SOB, CP, vision changes, LE edema or symptoms of hypotension  HLD: -Medications: Lipitor 80 mg -Patient  is compliant with above medications and reports no side effects.  -Last lipid panel: Lipid Panel     Component Value Date/Time   CHOL 167 12/22/2023 1427   TRIG 390 (H) 12/22/2023 1427   HDL 58 12/22/2023 1427   CHOLHDL 2.9 12/22/2023 1427   VLDL NOT CALC 05/19/2017 1429   LDLCALC 62 12/22/2023 1427   Pre-diabetes:  -Last A1c 6.5% 3/25 -Medications: Nothing   COPD: -COPD status: stable -Current medications: Trelegy, Albuterol   -Satisfied with current treatment?: yes -Oxygen use: no -Dyspnea frequency: sometimes with exertion  -Cough frequency: daily sometimes productive -Rescue inhaler frequency:  2-3 times a day -Limitation of activity: no -Pneumovax: Up to Date -Influenza: Not up to Date  MDD: -Mood status: stable -Current treatment: Wellbutrin  XL 300 mg  -Satisfied with current treatment?: yes  -Duration of current treatment : chronic -Side effects: no Medication compliance: excellent compliance     08/30/2024    1:04 PM 12/22/2023    1:52 PM 08/27/2023   10:16 AM 07/01/2023   11:42 AM 05/27/2023    2:11 PM  Depression screen PHQ 2/9  Decreased Interest 0 0 0 0 0  Down, Depressed, Hopeless 0 0 0 0 0  PHQ - 2 Score 0 0 0 0 0  Altered sleeping  0 0 0 0  Tired, decreased energy  0 0 0 0  Change in appetite  0 0 0 0  Feeling bad or failure about yourself   0 0 0 0  Trouble concentrating  0 0 0  0  Moving slowly or fidgety/restless  0 0 0 0  Suicidal thoughts  0 0 0 0  PHQ-9 Score  0  0  0  0   Difficult doing work/chores   Not difficult at all Not difficult at all Not difficult at all     Data saved with a previous flowsheet row definition   Health Maintenance: -Blood work UTD -Mammogram 11/25 Birads-1 -Colon cancer screening due -Declines flu and Prevnar 20 today   Patient Active Problem List   Diagnosis Date Noted   Cervical spondylosis with myelopathy and radiculopathy 11/20/2023   Spondylolisthesis at L4-L5 level 11/03/2023   Spinal stenosis, lumbar  region, with neurogenic claudication 11/03/2023   Mechanical low back pain 11/03/2023   Neck pain 11/03/2023   Hyperreflexia 11/03/2023   Acute appendicitis with localized peritonitis 08/18/2023   Osteopenia 06/04/2022   Current mild episode of major depressive disorder, unspecified whether recurrent 02/12/2021   Type 2 diabetes mellitus with diabetic polyneuropathy, without long-term current use of insulin (HCC) 02/04/2020   Smoking 12/01/2018   Aortic atherosclerosis 12/29/2017   Hepatic steatosis 12/29/2017   COPD (chronic obstructive pulmonary disease) (HCC) 12/05/2017   Degenerative disc disease, cervical 09/04/2017   Cervical arthritis 09/04/2017   Muscle spasm of left shoulder area 09/03/2017   Elevated platelet count 04/11/2017   Marijuana use 03/17/2017   Prediabetes 03/14/2017   Essential hypertension, benign 03/14/2017   Hyperlipidemia 03/14/2017   Anxiety disorder 03/14/2017   Past Medical History:  Diagnosis Date   Anxiety    Aortic atherosclerosis 12/29/2017   Chest CT March 2019   Cervical arthritis 09/04/2017   xrays Dec 2018   Degenerative disc disease, cervical 09/04/2017   Xrays Dec 2018   Depression    Emphysema lung (HCC) 12/29/2017   Chest CT March 2019   Hepatic steatosis 12/29/2017   Chest CT March 2019   Hyperlipidemia    Hypertension    Prediabetes 03/14/2017   Past Surgical History:  Procedure Laterality Date   BREAST BIOPSY Left 07/18/2022   stereo bx, calcs, COIL clip-BENIGN BREAST PARENCHYMA WITH MICROCALCIFICATIONS. - NEGATIVE FOR MALIGNANCY.   XI ROBOTIC LAPAROSCOPIC ASSISTED APPENDECTOMY N/A 08/18/2023   Procedure: XI ROBOTIC LAPAROSCOPIC ASSISTED APPENDECTOMY;  Surgeon: Rodolph Romano, MD;  Location: [facility] ORS;  Service: General;  Laterality: N/A;   Social History   Tobacco Use   Smoking status: Every Day    Current packs/day: 0.50    Average packs/day: 0.5 packs/day for 45.3 years (22.7 ttl pk-yrs)    Types: Cigarettes    Start  date: 08/30/2018   Smokeless tobacco: Never  Vaping Use   Vaping status: Former  Substance Use Topics   Alcohol use: Yes    Alcohol/week: 5.0 standard drinks of alcohol    Types: 1 Glasses of wine, 4 Cans of beer per week   Drug use: No   Social History   Socioeconomic History   Marital status: Widowed    Spouse name: Not on file   Number of children: Not on file   Years of education: Not on file   Highest education level: Not on file  Occupational History   Not on file  Tobacco Use   Smoking status: Every Day    Current packs/day: 0.50    Average packs/day: 0.5 packs/day for 45.3 years (22.7 ttl pk-yrs)    Types: Cigarettes    Start date: 08/30/2018   Smokeless tobacco: Never  Vaping Use   Vaping status: Former  Substance and  Sexual Activity   Alcohol use: Yes    Alcohol/week: 5.0 standard drinks of alcohol    Types: 1 Glasses of wine, 4 Cans of beer per week   Drug use: No   Sexual activity: Not Currently    Birth control/protection: Post-menopausal  Other Topics Concern   Not on file  Social History Narrative   Not on file   Social Drivers of Health   Financial Resource Strain: Low Risk  (02/22/2022)   Overall Financial Resource Strain (CARDIA)    Difficulty of Paying Living Expenses: Not hard at all  Food Insecurity: No Food Insecurity (08/18/2023)   Hunger Vital Sign    Worried About Running Out of Food in the Last Year: Never true    Ran Out of Food in the Last Year: Never true  Transportation Needs: No Transportation Needs (08/18/2023)   PRAPARE - Administrator, Civil Service (Medical): No    Lack of Transportation (Non-Medical): No  Physical Activity: Inactive (02/22/2022)   Exercise Vital Sign    Days of Exercise per Week: 0 days    Minutes of Exercise per Session: 0 min  Stress: No Stress Concern Present (02/22/2022)   [employer] of Occupational Health - Occupational Stress Questionnaire    Feeling of Stress : Only a little  Social  Connections: Socially Isolated (02/22/2022)   Social Connection and Isolation Panel    Frequency of Communication with Friends and Family: Twice a week    Frequency of Social Gatherings with Friends and Family: Twice a week    Attends Religious Services: Never    Database Administrator or Organizations: No    Attends Banker Meetings: Never    Marital Status: Widowed  Intimate Partner Violence: Not At Risk (08/18/2023)   Humiliation, Afraid, Rape, and Kick questionnaire    Fear of Current or Ex-Partner: No    Emotionally Abused: No    Physically Abused: No    Sexually Abused: No   Family Status  Relation Name Status   Mother  Deceased at age 31       dementia   Sister older Alive   Father  Deceased at age 7       aneurysm brain   Brother younger Deceased at age 73       heart attack    Daughter middle Deceased at age 45       brain cancer   Son  Alive   MGM  Deceased at age 71       old age   MGF  Deceased       unknown   PGM  Deceased       old age   PGF  Deceased       heart attack    Brother older Alive   Daughter  Alive   Sister youngest Alive   Sister  Alive  No partnership data on file   Family History  Problem Relation Age of Onset   Dementia Mother    Breast cancer Sister 42   Aneurysm Father    Heart attack Brother    Heart defect Brother    Brain cancer Daughter    Heart attack Paternal Grandfather    Drug abuse Sister    No Known Allergies    Review of Systems  All other systems reviewed and are negative.     Objective:     BP 130/80 (Cuff Size: Large)   Pulse 92  Temp 98 F (36.7 C) (Oral)   Resp 16   Ht 5' 1 (1.549 m)   Wt 134 lb 9.6 oz (61.1 kg)   SpO2 93%   BMI 25.43 kg/m  BP Readings from Last 3 Encounters:  08/30/24 130/80  03/09/24 132/80  01/19/24 (!) 142/76   Wt Readings from Last 3 Encounters:  08/30/24 134 lb 9.6 oz (61.1 kg)  03/09/24 136 lb (61.7 kg)  01/19/24 143 lb 6.4 oz (65 kg)       Physical Exam Constitutional:      Appearance: Normal appearance.  HENT:     Head: Normocephalic and atraumatic.  Eyes:     Conjunctiva/sclera: Conjunctivae normal.  Cardiovascular:     Rate and Rhythm: Normal rate and regular rhythm.  Pulmonary:     Effort: Pulmonary effort is normal.     Breath sounds: Normal breath sounds.  Skin:    General: Skin is warm and dry.  Neurological:     General: No focal deficit present.     Mental Status: She is alert. Mental status is at baseline.  Psychiatric:        Mood and Affect: Mood normal.        Behavior: Behavior normal.      No results found for any visits on 08/30/24.  Last CBC Lab Results  Component Value Date   WBC 6.0 03/09/2024   HGB 15.2 03/09/2024   HCT 47.6 (H) 03/09/2024   MCV 95.0 03/09/2024   MCH 30.3 03/09/2024   RDW 13.3 03/09/2024   PLT 424 (H) 03/09/2024   Last metabolic panel Lab Results  Component Value Date   GLUCOSE 127 (H) 03/09/2024   NA 138 03/09/2024   K 5.2 03/09/2024   CL 101 03/09/2024   CO2 31 03/09/2024   BUN 9 03/09/2024   CREATININE 0.75 03/09/2024   EGFR 89 03/09/2024   CALCIUM  9.5 03/09/2024   PROT 6.8 03/09/2024   ALBUMIN 4.1 08/17/2023   BILITOT 0.5 03/09/2024   ALKPHOS 60 08/17/2023   AST 31 03/09/2024   ALT 16 03/09/2024   ANIONGAP 15 08/17/2023   Last lipids Lab Results  Component Value Date   CHOL 167 12/22/2023   HDL 58 12/22/2023   LDLCALC 62 12/22/2023   TRIG 390 (H) 12/22/2023   CHOLHDL 2.9 12/22/2023   Last hemoglobin A1c Lab Results  Component Value Date   HGBA1C 6.0 (A) 08/30/2024   Last thyroid functions Lab Results  Component Value Date   TSH 1.71 03/09/2024   Last vitamin D No results found for: 25OHVITD2, 25OHVITD3, VD25OH Last vitamin B12 and Folate No results found for: VITAMINB12, FOLATE    The 10-year ASCVD risk score (Arnett DK, et al., 2019) is: 20.8%    Assessment & Plan:   Assessment & Plan Prediabetes A1c improved  from 6.5% to 6.0%, indicating prediabetes. Emphasized dietary management to prevent diabetes progression. - Recheck A1c every six months. - Advised dietary modifications to reduce sugars and carbohydrates.  Essential hypertension Blood pressure controlled at 130/80 mmHg with lisinopril  10 mg daily. - Continue lisinopril  10 mg daily. - Refilled lisinopril  prescription.  Mixed hyperlipidemia Cholesterol managed with atorvastatin  80 mg. LDL at 62 mg/dL. Atorvastatin  effective in reducing cardiovascular risk. - Continue atorvastatin  80 mg daily. - Switched atorvastatin  prescription to 90-day supply.  Chronic obstructive pulmonary disease COPD managed with Trelegy and albuterol . Daily cough likely due to smoking history. - Continue Trelegy inhaler. - Refilled Trelegy prescription. - Continue albuterol  inhaler as  needed.  Cervical and lumbar spondylosis with radiculopathy Degenerative disc disease causing pinched nerves. Physical therapy provides some relief. Lyrica  used for neuropathic pain, efficacy uncertain. - Continue Lyrica  25 mg twice daily. - Refilled Lyrica  prescription. - Reassess Lyrica  efficacy at next visit.  MDD/Anxiety Anxiety managed with Wellbutrin , effective for mood stabilization. Discussed hydroxyzine and Buspar  as alternatives for acute anxiety. - Continue Wellbutrin . - Refilled Wellbutrin  prescription.  General health maintenance Mammogram normal. Colonoscopy due, last in 2009. Discussed colonoscopy and Cologuard, she prefers colonoscopy. Discussed flu and pneumonia vaccines, she deferred pneumonia vaccine. - Referred for colonoscopy. - Offered flu vaccine. - Discussed pneumonia vaccine, she deferred.  - lisinopril  (ZESTRIL ) 10 MG tablet; Take 1 tablet (10 mg total) by mouth daily.  Dispense: 90 tablet; Refill: 1 - atorvastatin  (LIPITOR) 80 MG tablet; Take 1 tablet (80 mg total) by mouth at bedtime.  Dispense: 90 tablet; Refill: 1 - Fluticasone -Umeclidin-Vilant  (TRELEGY ELLIPTA ) 100-62.5-25 MCG/ACT AEPB; Inhale 1 each into the lungs daily.  Dispense: 60 each; Refill: 5 - buPROPion  (WELLBUTRIN  XL) 300 MG 24 hr tablet; Take 1 tablet (300 mg total) by mouth daily.  Dispense: 90 tablet; Refill: 1 - pregabalin  (LYRICA ) 25 MG capsule; Take 1 capsule (25 mg total) by mouth 2 (two) times daily.  Dispense: 180 capsule; Refill: 1 - Ambulatory referral to Gastroenterology - POCT HgB A1C - atorvastatin  (LIPITOR) 80 MG tablet; Take 1 tablet (80 mg total) by mouth at bedtime.  Dispense: 90 tablet; Refill: 1 - Fluticasone -Umeclidin-Vilant (TRELEGY ELLIPTA ) 100-62.5-25 MCG/ACT AEPB; Inhale 1 each into the lungs daily.  Dispense: 60 each; Refill: 5   Return in about 6 months (around 02/28/2025).    Sharyle Fischer, DO

## 2025-02-28 ENCOUNTER — Ambulatory Visit: Admitting: Internal Medicine
